# Patient Record
Sex: Male | Born: 1950 | Race: White | Hispanic: No | Marital: Married | Smoking: Former smoker
Health system: Southern US, Community
[De-identification: ages and names within clinical notes are randomized; demographics above are authoritative.]

## PROBLEM LIST (undated history)

## (undated) DIAGNOSIS — Z86711 Personal history of pulmonary embolism: Secondary | ICD-10-CM

## (undated) DIAGNOSIS — Z9289 Personal history of other medical treatment: Secondary | ICD-10-CM

## (undated) DIAGNOSIS — G8929 Other chronic pain: Secondary | ICD-10-CM

## (undated) DIAGNOSIS — I2699 Other pulmonary embolism without acute cor pulmonale: Secondary | ICD-10-CM

## (undated) DIAGNOSIS — Z973 Presence of spectacles and contact lenses: Secondary | ICD-10-CM

## (undated) DIAGNOSIS — C679 Malignant neoplasm of bladder, unspecified: Secondary | ICD-10-CM

## (undated) DIAGNOSIS — M109 Gout, unspecified: Secondary | ICD-10-CM

## (undated) DIAGNOSIS — Z87448 Personal history of other diseases of urinary system: Secondary | ICD-10-CM

## (undated) DIAGNOSIS — Z87828 Personal history of other (healed) physical injury and trauma: Secondary | ICD-10-CM

## (undated) DIAGNOSIS — E119 Type 2 diabetes mellitus without complications: Secondary | ICD-10-CM

## (undated) DIAGNOSIS — M545 Low back pain: Secondary | ICD-10-CM

## (undated) DIAGNOSIS — R399 Unspecified symptoms and signs involving the genitourinary system: Secondary | ICD-10-CM

## (undated) DIAGNOSIS — E782 Mixed hyperlipidemia: Secondary | ICD-10-CM

## (undated) DIAGNOSIS — C3490 Malignant neoplasm of unspecified part of unspecified bronchus or lung: Secondary | ICD-10-CM

## (undated) DIAGNOSIS — N210 Calculus in bladder: Secondary | ICD-10-CM

## (undated) DIAGNOSIS — M542 Cervicalgia: Secondary | ICD-10-CM

## (undated) DIAGNOSIS — M5135 Other intervertebral disc degeneration, thoracolumbar region: Secondary | ICD-10-CM

## (undated) DIAGNOSIS — L309 Dermatitis, unspecified: Secondary | ICD-10-CM

## (undated) DIAGNOSIS — S82009A Unspecified fracture of unspecified patella, initial encounter for closed fracture: Secondary | ICD-10-CM

## (undated) DIAGNOSIS — M4850XA Collapsed vertebra, not elsewhere classified, site unspecified, initial encounter for fracture: Secondary | ICD-10-CM

## (undated) DIAGNOSIS — I714 Abdominal aortic aneurysm, without rupture, unspecified: Secondary | ICD-10-CM

## (undated) DIAGNOSIS — M1A9XX0 Chronic gout, unspecified, without tophus (tophi): Secondary | ICD-10-CM

## (undated) DIAGNOSIS — K529 Noninfective gastroenteritis and colitis, unspecified: Secondary | ICD-10-CM

## (undated) DIAGNOSIS — N323 Diverticulum of bladder: Secondary | ICD-10-CM

## (undated) DIAGNOSIS — G4733 Obstructive sleep apnea (adult) (pediatric): Secondary | ICD-10-CM

## (undated) DIAGNOSIS — K08109 Complete loss of teeth, unspecified cause, unspecified class: Secondary | ICD-10-CM

## (undated) DIAGNOSIS — N400 Enlarged prostate without lower urinary tract symptoms: Secondary | ICD-10-CM

## (undated) DIAGNOSIS — T4145XA Adverse effect of unspecified anesthetic, initial encounter: Secondary | ICD-10-CM

## (undated) DIAGNOSIS — L509 Urticaria, unspecified: Secondary | ICD-10-CM

## (undated) DIAGNOSIS — Z85118 Personal history of other malignant neoplasm of bronchus and lung: Secondary | ICD-10-CM

## (undated) DIAGNOSIS — E7800 Pure hypercholesterolemia, unspecified: Secondary | ICD-10-CM

## (undated) DIAGNOSIS — T8859XA Other complications of anesthesia, initial encounter: Secondary | ICD-10-CM

## (undated) HISTORY — DX: Other pulmonary embolism without acute cor pulmonale: I26.99

## (undated) HISTORY — PX: BACK SURGERY: SHX140

## (undated) HISTORY — DX: Low back pain: M54.5

## (undated) HISTORY — PX: CHOLECYSTECTOMY: SHX55

## (undated) HISTORY — DX: Dermatitis, unspecified: L30.9

## (undated) HISTORY — PX: LUMBAR SPINE SURGERY: SHX701

## (undated) HISTORY — DX: Unspecified fracture of unspecified patella, initial encounter for closed fracture: S82.009A

## (undated) HISTORY — PX: TONSILLECTOMY: SUR1361

## (undated) HISTORY — DX: Urticaria, unspecified: L50.9

## (undated) HISTORY — PX: TOTAL KNEE ARTHROPLASTY: SHX125

## (undated) HISTORY — PX: ADENOIDECTOMY: SUR15

## (undated) HISTORY — PX: LAPAROSCOPIC CHOLECYSTECTOMY: SUR755

## (undated) HISTORY — PX: WRIST SURGERY: SHX841

## (undated) HISTORY — PX: NASAL SINUS SURGERY: SHX719

## (undated) HISTORY — PX: LUNG LOBECTOMY: SHX167

## (undated) HISTORY — PX: RECONSTRUCTION OF NOSE: SHX2301

## (undated) HISTORY — DX: Cervicalgia: M54.2

## (undated) HISTORY — PX: JOINT REPLACEMENT: SHX530

---
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
## 1998-09-14 ENCOUNTER — Emergency Department (HOSPITAL_COMMUNITY): Admission: EM | Admit: 1998-09-14 | Discharge: 1998-09-14 | Payer: Self-pay | Admitting: Emergency Medicine

## 1998-09-21 ENCOUNTER — Encounter: Admission: RE | Admit: 1998-09-21 | Discharge: 1998-09-21 | Payer: Self-pay | Admitting: *Deleted

## 2000-02-19 ENCOUNTER — Encounter: Payer: Self-pay | Admitting: Orthopedic Surgery

## 2000-02-19 ENCOUNTER — Ambulatory Visit (HOSPITAL_COMMUNITY): Admission: RE | Admit: 2000-02-19 | Discharge: 2000-02-19 | Payer: Self-pay | Admitting: Orthopedic Surgery

## 2000-05-16 ENCOUNTER — Ambulatory Visit (HOSPITAL_BASED_OUTPATIENT_CLINIC_OR_DEPARTMENT_OTHER): Admission: RE | Admit: 2000-05-16 | Discharge: 2000-05-16 | Payer: Self-pay | Admitting: Orthopedic Surgery

## 2000-12-11 ENCOUNTER — Ambulatory Visit (HOSPITAL_BASED_OUTPATIENT_CLINIC_OR_DEPARTMENT_OTHER): Admission: RE | Admit: 2000-12-11 | Discharge: 2000-12-12 | Payer: Self-pay | Admitting: Orthopedic Surgery

## 2000-12-11 ENCOUNTER — Encounter (INDEPENDENT_AMBULATORY_CARE_PROVIDER_SITE_OTHER): Payer: Self-pay | Admitting: *Deleted

## 2000-12-30 ENCOUNTER — Emergency Department (HOSPITAL_COMMUNITY): Admission: EM | Admit: 2000-12-30 | Discharge: 2000-12-30 | Payer: Self-pay | Admitting: Emergency Medicine

## 2001-07-08 ENCOUNTER — Encounter: Payer: Self-pay | Admitting: Internal Medicine

## 2001-07-08 ENCOUNTER — Encounter: Admission: RE | Admit: 2001-07-08 | Discharge: 2001-07-08 | Payer: Self-pay | Admitting: Internal Medicine

## 2001-11-07 ENCOUNTER — Encounter: Payer: Self-pay | Admitting: Orthopedic Surgery

## 2001-11-07 ENCOUNTER — Ambulatory Visit (HOSPITAL_COMMUNITY): Admission: RE | Admit: 2001-11-07 | Discharge: 2001-11-07 | Payer: Self-pay | Admitting: Orthopedic Surgery

## 2002-05-11 ENCOUNTER — Encounter: Admission: RE | Admit: 2002-05-11 | Discharge: 2002-05-11 | Payer: Self-pay | Admitting: Neurology

## 2002-05-11 ENCOUNTER — Encounter: Payer: Self-pay | Admitting: Neurology

## 2005-12-08 ENCOUNTER — Emergency Department (HOSPITAL_COMMUNITY): Admission: EM | Admit: 2005-12-08 | Discharge: 2005-12-08 | Payer: Self-pay | Admitting: Emergency Medicine

## 2009-04-08 ENCOUNTER — Emergency Department (HOSPITAL_COMMUNITY): Admission: EM | Admit: 2009-04-08 | Discharge: 2009-04-08 | Payer: Self-pay | Admitting: Emergency Medicine

## 2009-05-09 ENCOUNTER — Encounter: Admission: RE | Admit: 2009-05-09 | Discharge: 2009-05-26 | Payer: Self-pay | Admitting: Pathology

## 2009-05-29 ENCOUNTER — Encounter: Admission: RE | Admit: 2009-05-29 | Discharge: 2009-06-08 | Payer: Self-pay | Admitting: Pathology

## 2009-06-28 HISTORY — PX: LUNG CANCER SURGERY: SHX702

## 2010-08-30 LAB — BASIC METABOLIC PANEL
BUN: 17 mg/dL (ref 6–23)
CO2: 22 mEq/L (ref 19–32)
Chloride: 104 mEq/L (ref 96–112)
Creatinine, Ser: 0.91 mg/dL (ref 0.4–1.5)

## 2010-08-30 LAB — DIFFERENTIAL
Basophils Relative: 0 % (ref 0–1)
Eosinophils Absolute: 0.3 10*3/uL (ref 0.0–0.7)
Neutrophils Relative %: 70 % (ref 43–77)

## 2010-08-30 LAB — CBC
MCHC: 34.3 g/dL (ref 30.0–36.0)
MCV: 96.8 fL (ref 78.0–100.0)
Platelets: 861 10*3/uL — ABNORMAL HIGH (ref 150–400)
RDW: 14.5 % (ref 11.5–15.5)

## 2010-10-13 NOTE — Op Note (Signed)
[place]  Patient:    Harold Morris, Harold Morris                        MRN: [id number] Proc. Date: 12/11/00 Adm. Date:  [id number] Attending:  Ronne Binning                           Operative Report  PREOPERATIVE DIAGNOSIS:  Scapholunate lunotriquetral tear right wrist.  POSTOPERATIVE DIAGNOSIS:  Scapholunate lunotriquetral tear right wrist.  OPERATION:  Proximal row carpectomy right wrist.  SURGEON:  Nicki Reaper, M.D.  ASSISTANT:  R.N.  ANESTHESIA:  General.  ANESTHESIOLOGIST:  Janetta Hora. Gelene Mink, M.D.  HISTORY:  The patient is 60 year old male with a history of scapholunate lunotriquetral tear.  He has undergone arthroscopic debridement; however, has not been able to rehabilitate.  He continues to complain of pain.  He is admitted now for proximal row carpectomy.  DESCRIPTION OF PROCEDURE:  The patient was brought to the operating room, where a general anesthetic was carried out without difficulty.  He was prepped and draped using Betadine scrubbing solution with the right arm free in supine position.  A longitudinal incision was made over the dorsal aspect of the wrist, carried down through subcutaneous tissue.  Bleeders were electrocauterized.  The interval between third and fourth dorsal compartment was opened, the retinaculum incised.  The dissection carried down to the capsule.  A longitudinal incision was made in the distal capsule leaving the proximal capitate.  The scapholunate were identified.  The T was then made leaving a large ______ tissue present over the proximal aspect of the capitate-scaphoid area.  The scapholunate lunotriquetral joints were identified and both were found to be torn.  With blunt and sharp dissection, the scaphoid was removed in one piece, taking care to protect the proximal capitate and distal radial articular surface.  The triquetrum was removed next.  Again, this was done after isolating the bone with  blunt and sharp dissection.  It was dissected free, again protecting the proximal capitate. The lunate was removed last with blunt and sharp dissection.  Each bone was removed by placement of a joy stick or using bone clamps to allow traction and removal.  Care was taken to protect the proximal capitate and distal lunate facet of the radius.  The capitate immediately relocated into the lunate facet of the distal radius and was quite stable.  There was no impingement on the radial styloid.  The wound was copiously irrigated with saline.  The capsule was then closed with figure-of-eight 4-0 Mersilene sutures.  The retinaculum closed with 4-0 Mersilene.  A PLS wound drain was placed at the depths of the wound.  The subcutaneous tissue was closed with interrupted 4-0 Vicryl and skin with a subcuticular Monocryl suture.  Steri-Strips were applied.  Sterile compressive dressing and dorsal palmar splint applied.  X-rays confirmed positioning of the capitate in the lunate facet of the distal radius.  The patient tolerated the procedure well and was taken to the recovery room for observation in satisfactory condition.  He is admitted for an overnight stay, for pain control.  He will be discharged on Percocet and Septra DS. DD:  12/11/00 TD:  12/11/00 Job: [id number]

## 2010-10-21 ENCOUNTER — Emergency Department (HOSPITAL_COMMUNITY)
Admission: EM | Admit: 2010-10-21 | Discharge: 2010-10-21 | Discharge status: Against Medical Advice | Payer: 59 | Attending: Emergency Medicine | Admitting: Emergency Medicine

## 2010-10-21 DIAGNOSIS — R5381 Other malaise: Secondary | ICD-10-CM | POA: Insufficient documentation

## 2010-10-21 DIAGNOSIS — Z79899 Other long term (current) drug therapy: Secondary | ICD-10-CM | POA: Insufficient documentation

## 2010-10-21 DIAGNOSIS — R3589 Other polyuria: Secondary | ICD-10-CM | POA: Insufficient documentation

## 2010-10-21 DIAGNOSIS — R358 Other polyuria: Secondary | ICD-10-CM | POA: Insufficient documentation

## 2010-10-21 DIAGNOSIS — E119 Type 2 diabetes mellitus without complications: Secondary | ICD-10-CM | POA: Insufficient documentation

## 2010-10-21 DIAGNOSIS — E7800 Pure hypercholesterolemia, unspecified: Secondary | ICD-10-CM | POA: Insufficient documentation

## 2010-10-21 DIAGNOSIS — I10 Essential (primary) hypertension: Secondary | ICD-10-CM | POA: Insufficient documentation

## 2010-10-21 LAB — GLUCOSE, CAPILLARY: Glucose-Capillary: 111 mg/dL — ABNORMAL HIGH (ref 70–99)

## 2014-12-31 ENCOUNTER — Emergency Department (HOSPITAL_COMMUNITY): Payer: Medicare Other

## 2014-12-31 ENCOUNTER — Inpatient Hospital Stay (HOSPITAL_COMMUNITY)
Admission: EM | Admit: 2014-12-31 | Discharge: 2015-01-03 | DRG: 176 | Disposition: A | Discharge status: Home / Self Care | Payer: Medicare Other | Attending: Internal Medicine | Admitting: Internal Medicine

## 2014-12-31 ENCOUNTER — Encounter (HOSPITAL_COMMUNITY): Payer: Self-pay | Admitting: *Deleted

## 2014-12-31 DIAGNOSIS — M549 Dorsalgia, unspecified: Secondary | ICD-10-CM | POA: Diagnosis present

## 2014-12-31 DIAGNOSIS — M109 Gout, unspecified: Secondary | ICD-10-CM | POA: Diagnosis present

## 2014-12-31 DIAGNOSIS — E1165 Type 2 diabetes mellitus with hyperglycemia: Secondary | ICD-10-CM | POA: Diagnosis present

## 2014-12-31 DIAGNOSIS — Z9221 Personal history of antineoplastic chemotherapy: Secondary | ICD-10-CM

## 2014-12-31 DIAGNOSIS — I2699 Other pulmonary embolism without acute cor pulmonale: Principal | ICD-10-CM | POA: Diagnosis present

## 2014-12-31 DIAGNOSIS — Z79899 Other long term (current) drug therapy: Secondary | ICD-10-CM

## 2014-12-31 DIAGNOSIS — Z882 Allergy status to sulfonamides status: Secondary | ICD-10-CM

## 2014-12-31 DIAGNOSIS — E780 Pure hypercholesterolemia: Secondary | ICD-10-CM | POA: Diagnosis present

## 2014-12-31 DIAGNOSIS — Z79891 Long term (current) use of opiate analgesic: Secondary | ICD-10-CM

## 2014-12-31 DIAGNOSIS — E119 Type 2 diabetes mellitus without complications: Secondary | ICD-10-CM

## 2014-12-31 DIAGNOSIS — G8929 Other chronic pain: Secondary | ICD-10-CM | POA: Diagnosis present

## 2014-12-31 DIAGNOSIS — R609 Edema, unspecified: Secondary | ICD-10-CM

## 2014-12-31 DIAGNOSIS — Z885 Allergy status to narcotic agent status: Secondary | ICD-10-CM

## 2014-12-31 DIAGNOSIS — Z85118 Personal history of other malignant neoplasm of bronchus and lung: Secondary | ICD-10-CM

## 2014-12-31 DIAGNOSIS — M542 Cervicalgia: Secondary | ICD-10-CM | POA: Diagnosis present

## 2014-12-31 DIAGNOSIS — Z7982 Long term (current) use of aspirin: Secondary | ICD-10-CM

## 2014-12-31 DIAGNOSIS — Z888 Allergy status to other drugs, medicaments and biological substances status: Secondary | ICD-10-CM

## 2014-12-31 HISTORY — DX: Malignant neoplasm of unspecified part of unspecified bronchus or lung: C34.90

## 2014-12-31 HISTORY — DX: Pure hypercholesterolemia, unspecified: E78.00

## 2014-12-31 HISTORY — DX: Type 2 diabetes mellitus without complications: E11.9

## 2014-12-31 HISTORY — DX: Gout, unspecified: M10.9

## 2014-12-31 LAB — URINALYSIS, ROUTINE W REFLEX MICROSCOPIC
Bilirubin Urine: NEGATIVE
Glucose, UA: NEGATIVE mg/dL
Hgb urine dipstick: NEGATIVE
Ketones, ur: NEGATIVE mg/dL
Leukocytes, UA: NEGATIVE
Nitrite: NEGATIVE
Protein, ur: NEGATIVE mg/dL
Specific Gravity, Urine: 1.015 (ref 1.005–1.030)
Urobilinogen, UA: 0.2 mg/dL (ref 0.0–1.0)
pH: 5 (ref 5.0–8.0)

## 2014-12-31 LAB — CBC
HEMATOCRIT: 37.7 % — AB (ref 39.0–52.0)
HEMOGLOBIN: 12 g/dL — AB (ref 13.0–17.0)
MCH: 24.5 pg — AB (ref 26.0–34.0)
MCHC: 31.8 g/dL (ref 30.0–36.0)
MCV: 77.1 fL — AB (ref 78.0–100.0)
PLATELETS: 257 10*3/uL (ref 150–400)
RBC: 4.89 MIL/uL (ref 4.22–5.81)
RDW: 17.8 % — ABNORMAL HIGH (ref 11.5–15.5)
WBC: 10.8 10*3/uL — ABNORMAL HIGH (ref 4.0–10.5)

## 2014-12-31 LAB — CK: CK TOTAL: 73 U/L (ref 49–397)

## 2014-12-31 LAB — BASIC METABOLIC PANEL WITH GFR
Anion gap: 11 (ref 5–15)
BUN: 18 mg/dL (ref 6–20)
CO2: 21 mmol/L — ABNORMAL LOW (ref 22–32)
Calcium: 9.3 mg/dL (ref 8.9–10.3)
Chloride: 103 mmol/L (ref 101–111)
Creatinine, Ser: 1.42 mg/dL — ABNORMAL HIGH (ref 0.61–1.24)
GFR calc Af Amer: 58 mL/min — ABNORMAL LOW
GFR calc non Af Amer: 50 mL/min — ABNORMAL LOW
Glucose, Bld: 224 mg/dL — ABNORMAL HIGH (ref 65–99)
Potassium: 3.8 mmol/L (ref 3.5–5.1)
Sodium: 135 mmol/L (ref 135–145)

## 2014-12-31 LAB — I-STAT TROPONIN, ED: Troponin i, poc: 0 ng/mL (ref 0.00–0.08)

## 2014-12-31 LAB — TROPONIN I: Troponin I: 0.05 ng/mL — ABNORMAL HIGH (ref <0.031)

## 2014-12-31 MED ORDER — HEPARIN (PORCINE) IN NACL 100-0.45 UNIT/ML-% IJ SOLN
1750.0000 [IU]/h | INTRAMUSCULAR | Status: AC
  Administered 2015-01-01: 1600 [IU]/h via INTRAVENOUS
  Administered 2015-01-01: 1400 [IU]/h via INTRAVENOUS
  Administered 2015-01-02: 1600 [IU]/h via INTRAVENOUS
  Administered 2015-01-02: 1750 [IU]/h via INTRAVENOUS
  Filled 2014-12-31 (×8): qty 250

## 2014-12-31 MED ORDER — IOHEXOL 350 MG/ML SOLN
100.0000 mL | Freq: Once | INTRAVENOUS | Status: AC | PRN
  Administered 2014-12-31: 100 mL via INTRAVENOUS

## 2014-12-31 MED ORDER — SODIUM CHLORIDE 0.9 % IV BOLUS (SEPSIS)
1000.0000 mL | Freq: Once | INTRAVENOUS | Status: AC
  Administered 2014-12-31: 1000 mL via INTRAVENOUS

## 2014-12-31 MED ORDER — HEPARIN BOLUS VIA INFUSION
6000.0000 [IU] | Freq: Once | INTRAVENOUS | Status: AC
  Administered 2015-01-01: 6000 [IU] via INTRAVENOUS
  Filled 2014-12-31: qty 6000

## 2014-12-31 NOTE — ED Provider Notes (Addendum)
CSN: [id number]     Arrival date & time 12/31/14  1538 History   First MD Initiated Contact with Patient 12/31/14 2031     Chief Complaint  Patient presents with  . Chest Pain  . Headache  . Diarrhea     (Consider location/radiation/quality/duration/timing/severity/associated sxs/prior Treatment) HPI Comments: 64yo M w/ PMH including lung CA s/p resection, T2DM, HLD, gout, AAA who p/w generalized malaise, diarrhea, and chest pain. The patient states that 4-5 days ago, he began not feeling well. He has chronic diarrhea but it has been much worse over the past 4 days. He reports intermittent sharp pains that are in his bilateral hips, the right side of his trunk, and up into his right chest around to his back. The pain is worse with deep inspiration around his right chest. He has had some chronic shortness of breath with exertion since his surgery but feels that it may be worse recently. He reports a gradual onset of headache several days ago that was not sudden or maximal in onset. The headache is currently mild. He denies any fevers, cough/cold symptoms, sore throat, nausea, vomiting, or abdominal pain. He has noticed some polyuria and polydipsia. Eighth that he is laid in bed for the past 3 days. The patient denies any outdoor activity because of his chronic back pain and denies any tick exposure. No rashes.  Patient is a 64 y.o. male presenting with chest pain, headaches, and diarrhea. The history is provided by the patient.  Chest Pain Associated symptoms: headache   Headache Associated symptoms: diarrhea   Diarrhea Associated symptoms: headaches     Past Medical History  Diagnosis Date  . Lung cancer   . Diabetes mellitus without complication   . High cholesterol   . Gout    History reviewed. No pertinent past surgical history. History reviewed. No pertinent family history. History  Substance Use Topics  . Smoking status: Not on file  . Smokeless tobacco: Not on file  . Alcohol  Use: No    Review of Systems  Cardiovascular: Positive for chest pain.  Gastrointestinal: Positive for diarrhea.  Neurological: Positive for headaches.    10 Systems reviewed and are negative for acute change except as noted in the HPI.   Allergies  Epinephrine; Sulfa antibiotics; Hydrocodone; and Morphine  Home Medications   Prior to Admission medications   Medication Sig Start Date End Date Taking? Authorizing Provider  allopurinol (ZYLOPRIM) 300 MG tablet Take 300 mg by mouth daily.   Yes Historical Provider, MD  aspirin EC 81 MG tablet Take 81 mg by mouth daily.   Yes Historical Provider, MD  Cyanocobalamin (VITAMIN B-12 CR) 1000 MCG TBCR Take 1,000 mg by mouth daily.   Yes Historical Provider, MD  ERGOCALCIFEROL PO Take 1 tablet by mouth daily.   Yes Historical Provider, MD  fenofibrate (TRICOR) 145 MG tablet Take 145 mg by mouth daily.   Yes Historical Provider, MD  gabapentin (NEURONTIN) 600 MG tablet Take 600-900 mg by mouth 2 (two) times daily. Takes 2 caps in am and 3 caps at bedtime   Yes   HYDROcodone-acetaminophen (NORCO) 10-325 MG per tablet Take 1 tablet by mouth every 6 (six) hours as needed for moderate pain.   Yes Historical Provider, MD  magnesium oxide (MAG-OX) 400 MG tablet Take 400 mg by mouth 2 (two) times daily.   Yes Historical Provider, MD  meclizine (ANTIVERT) 25 MG tablet Take 25 mg by mouth daily as needed for dizziness.  Yes Historical Provider, MD  metFORMIN (GLUCOPHAGE) 500 MG tablet Take 500 mg by mouth daily before breakfast.   Yes Historical Provider, MD  omeprazole (PRILOSEC) 20 MG capsule Take 20 mg by mouth daily.   Yes Historical Provider, MD  sertraline (ZOLOFT) 100 MG tablet Take 100 mg by mouth daily.   Yes Historical Provider, MD   BP 104/51 mmHg  Pulse 75  Temp(Src) 98.2 F (36.8 C) (Oral)  Resp 20  SpO2 98% Physical Exam  Constitutional: He is oriented to person, place, and time. He appears well-developed and well-nourished. No  distress.  HENT:  Head: Normocephalic and atraumatic.  Moist mucous membranes  Eyes: Conjunctivae are normal. Pupils are equal, round, and reactive to light.  Neck: Neck supple.  Cardiovascular: Normal rate, regular rhythm and normal heart sounds.   No murmur heard. Pulmonary/Chest: Effort normal and breath sounds normal.  Abdominal: Soft. Bowel sounds are normal. He exhibits no distension. There is no tenderness.  Musculoskeletal: He exhibits no edema.  Neurological: He is alert and oriented to person, place, and time.  Fluent speech  Skin: Skin is warm and dry.  Psychiatric: Judgment normal.  Bizarre affect  Nursing note and vitals reviewed.   ED Course  Procedures (including critical care time) Labs Review Labs Reviewed  BASIC METABOLIC PANEL - Abnormal; Notable for the following:    CO2 21 (*)    Glucose, Bld 224 (*)    Creatinine, Ser 1.42 (*)    GFR calc non Af Amer 50 (*)    GFR calc Af Amer 58 (*)    All other components within normal limits  CBC - Abnormal; Notable for the following:    WBC 10.8 (*)    Hemoglobin 12.0 (*)    HCT 37.7 (*)    MCV 77.1 (*)    MCH 24.5 (*)    RDW 17.8 (*)    All other components within normal limits  TROPONIN I - Abnormal; Notable for the following:    Troponin I 0.05 (*)    All other components within normal limits  CK  URINALYSIS, ROUTINE W REFLEX MICROSCOPIC (NOT AT [facility])  BRAIN NATRIURETIC PEPTIDE  I-STAT TROPOININ, ED    Imaging Review Dg Chest 2 View  12/31/2014   CLINICAL DATA:  Shortness of breath for several months on off after activity including walking, worse recently, past history lung cancer, diabetes mellitus  EXAM: CHEST  2 VIEW  COMPARISON:  04/08/2009  FINDINGS: Upper normal heart size.  Mediastinal contours and pulmonary vascularity normal.  LEFT basilar atelectasis.  Remaining lungs clear.  No pleural effusion or pneumothorax.  IMPRESSION: LEFT basilar atelectasis.  Previous identified LEFT perihilar mass/tumor  no longer identified.   Electronically Signed   By: Lavonia Dana M.D.   On: 12/31/2014 16:30   Ct Angio Chest Pe W/cm &/or Wo Cm  12/31/2014   CLINICAL DATA:  Right chest pain and mid chest pressure radiating into the back and down the right torso. Known abdominal aortic aneurysm.  EXAM: CT ANGIOGRAPHY CHEST  CT ABDOMEN AND PELVIS WITH CONTRAST  TECHNIQUE: Multidetector CT imaging of the chest was performed using the standard protocol during bolus administration of intravenous contrast. Multiplanar CT image reconstructions and MIPs were obtained to evaluate the vascular anatomy. Multidetector CT imaging of the abdomen and pelvis was performed using the standard protocol during bolus administration of intravenous contrast.  CONTRAST:  168m OMNIPAQUE IOHEXOL 350 MG/ML SOLN  COMPARISON:  04/08/2009  FINDINGS: CTA CHEST FINDINGS  Cardiovascular: There is a large volume pulmonary embolus within the right pulmonary arterial system, involving upper lobe, middle lobe and lower lobe. No left-sided pulmonary emboli are evident. There is CT evidence of right heart strain. The RV:LV ratio is 1.1.  Lungs: There is prior left lower lobectomy. There is mild curvilinear scarring in the left base. No confluent airspace consolidation.  Central airways: Patent  Effusions: None  Lymphadenopathy: None  Esophagus: Unremarkable  Musculoskeletal: No significant abnormality.  CT ABDOMEN and PELVIS FINDINGS  Hepatobiliary: There is prior cholecystectomy. The liver and bile ducts appear normal.  Pancreas: Normal  Spleen: Normal  Adrenals/Urinary Tract: The adrenals and kidneys are normal in appearance. There is no urinary calculus evident. There is no hydronephrosis or ureteral dilatation. Collecting systems and ureters appear unremarkable. There is a diverticulum at the right posterolateral aspect of the urinary bladder just anterior to the ureterovesical junction.  Stomach/Bowel: There are normal appearances of the stomach, small bowel  and colon. The appendix is normal.  Vascular/Lymphatic: There is a 3.2 cm infrarenal abdominal aortic aneurysm without dissection or leak. There is mild atherosclerotic calcification.  Reproductive: Unremarkable  Other: No acute inflammatory changes are evident in the abdomen or pelvis. There is no adenopathy. There is no ascites.  Musculoskeletal: There is posterior decompression of the lumbar spine from L3 through the sacrum with instrumented fusion from L4 through the sacrum. There is a fat containing left inguinal hernia  Review of the MIP images confirms the above findings.  IMPRESSION: 1. Large volume pulmonary embolism to the right pulmonary arterial system, with CT evidence of right heart strain. These results were called by telephone at the time of interpretation on 12/31/2014 at 11:20 pm to Dr. Theotis Burrow , who verbally acknowledged these results. Positive for acute PE with CT evidence of right heart strain (RV/LV Ratio = 1.1) consistent with at least submassive (intermediate risk) PE. The presence of right heart strain has been associated with an increased risk of morbidity and mortality. Please activate Code PE by paging [phone]. 2. Left lower lobectomy. No evidence of active neoplastic disease in the chest. 3. 3.2 cm infrarenal abdominal aortic aneurysm 4. Right-sided urinary bladder Hutch diverticulum 5. Fat containing left inguinal hernia.   Electronically Signed   By: Andreas Newport M.D.   On: 12/31/2014 23:25   Ct Abdomen Pelvis W Contrast  12/31/2014   CLINICAL DATA:  Right chest pain and mid chest pressure radiating into the back and down the right torso. Known abdominal aortic aneurysm.  EXAM: CT ANGIOGRAPHY CHEST  CT ABDOMEN AND PELVIS WITH CONTRAST  TECHNIQUE: Multidetector CT imaging of the chest was performed using the standard protocol during bolus administration of intravenous contrast. Multiplanar CT image reconstructions and MIPs were obtained to evaluate the vascular anatomy.  Multidetector CT imaging of the abdomen and pelvis was performed using the standard protocol during bolus administration of intravenous contrast.  CONTRAST:  164m OMNIPAQUE IOHEXOL 350 MG/ML SOLN  COMPARISON:  04/08/2009  FINDINGS: CTA CHEST FINDINGS  Cardiovascular: There is a large volume pulmonary embolus within the right pulmonary arterial system, involving upper lobe, middle lobe and lower lobe. No left-sided pulmonary emboli are evident. There is CT evidence of right heart strain. The RV:LV ratio is 1.1.  Lungs: There is prior left lower lobectomy. There is mild curvilinear scarring in the left base. No confluent airspace consolidation.  Central airways: Patent  Effusions: None  Lymphadenopathy: None  Esophagus: Unremarkable  Musculoskeletal: No significant abnormality.  CT ABDOMEN  and PELVIS FINDINGS  Hepatobiliary: There is prior cholecystectomy. The liver and bile ducts appear normal.  Pancreas: Normal  Spleen: Normal  Adrenals/Urinary Tract: The adrenals and kidneys are normal in appearance. There is no urinary calculus evident. There is no hydronephrosis or ureteral dilatation. Collecting systems and ureters appear unremarkable. There is a diverticulum at the right posterolateral aspect of the urinary bladder just anterior to the ureterovesical junction.  Stomach/Bowel: There are normal appearances of the stomach, small bowel and colon. The appendix is normal.  Vascular/Lymphatic: There is a 3.2 cm infrarenal abdominal aortic aneurysm without dissection or leak. There is mild atherosclerotic calcification.  Reproductive: Unremarkable  Other: No acute inflammatory changes are evident in the abdomen or pelvis. There is no adenopathy. There is no ascites.  Musculoskeletal: There is posterior decompression of the lumbar spine from L3 through the sacrum with instrumented fusion from L4 through the sacrum. There is a fat containing left inguinal hernia  Review of the MIP images confirms the above findings.   IMPRESSION: 1. Large volume pulmonary embolism to the right pulmonary arterial system, with CT evidence of right heart strain. These results were called by telephone at the time of interpretation on 12/31/2014 at 11:20 pm to Dr. Theotis Burrow , who verbally acknowledged these results. Positive for acute PE with CT evidence of right heart strain (RV/LV Ratio = 1.1) consistent with at least submassive (intermediate risk) PE. The presence of right heart strain has been associated with an increased risk of morbidity and mortality. Please activate Code PE by paging [phone]. 2. Left lower lobectomy. No evidence of active neoplastic disease in the chest. 3. 3.2 cm infrarenal abdominal aortic aneurysm 4. Right-sided urinary bladder Hutch diverticulum 5. Fat containing left inguinal hernia.   Electronically Signed   By: Andreas Newport M.D.   On: 12/31/2014 23:25     EKG Interpretation   Date/Time:  Friday December 31 2014 15:45:41 EDT Ventricular Rate:  92 PR Interval:  140 QRS Duration: 82 QT Interval:  364 QTC Calculation: 450 R Axis:   -30 Text Interpretation:  Normal sinus rhythm Left axis deviation Abnormal ECG  Confirmed by Makayle Krahn MD, Laurali Goddard ([id number]) on 12/31/2014 8:35:43 PM      MDM   Final diagnoses:  None  submassive PE with R heart strain H/o lung CA  64yo M w/ past mental history including lung cancer who presents with several days of right-sided chest pain and malaise. Patient with normal vital signs at presentation and in no acute distress. Obtained above lab work as well as EKG which was nonischemic. Chest x-ray without any acute findings. Labs notable for hyperglycemia without any other acute findings. Because of the patient's history of lung cancer, I am concerned about PE. He also reports that he has a AAA that has been followed by the [place]. Because of his multiple complaints, obtained CTA of chest to r/o PE and CT abd/pel to evaluate AAA.   Repeat troponin was 0.05. CT scan showed  a stable 3 cm AAA but multiple acute right-sided PE is with evidence of right heart strain, RV/LV ratio 1.1. Patient denies any blood in his stools. Immediately began heparin per PE protocol. Consulted PE Code team to evaluate patient. Patient will be admitted to general medicine for further treatment.   CRITICAL CARE Performed by: Wenda Overland Sammie Schermerhorn   Total critical care time: 30 minutes  Critical care time was exclusive of separately billable procedures and treating other patients.  Critical care was necessary to  treat or prevent imminent or life-threatening deterioration.  Critical care was time spent personally by me on the following activities: development of treatment plan with patient and/or surrogate as well as nursing, discussions with consultants, evaluation of patient's response to treatment, examination of patient, obtaining history from patient or surrogate, ordering and performing treatments and interventions, ordering and review of laboratory studies, ordering and review of radiographic studies, pulse oximetry and re-evaluation of patient's condition.  Sharlett Iles, MD 12/31/14 [place], MD 01/01/15 [phone]

## 2014-12-31 NOTE — ED Notes (Signed)
Attempted IV x2, unsuccessful.  

## 2014-12-31 NOTE — ED Notes (Signed)
Pt reports not feeling well x 4-5 days. Had diarrhea initially and now reports right and mid side chest pains, headaches and dizziness. Denies n/v.

## 2014-12-31 NOTE — ED Notes (Signed)
Pt refused high fall risk socks.

## 2014-12-31 NOTE — Progress Notes (Signed)
ANTICOAGULATION CONSULT NOTE - Initial Consult  Pharmacy Consult for Heparin  Indication: pulmonary embolus  Allergies  Allergen Reactions  . Epinephrine Anaphylaxis  . Sulfa Antibiotics Other (See Comments)    unknown  . Hydrocodone Itching and Rash    High strength dosing causes reaction  . Morphine Other (See Comments)    unknown    Patient Measurements: Pt reported: ~106 kg  Vital Signs: Temp: 98.2 F (36.8 C) (08/05 1542) Temp Source: Oral (08/05 1542) BP: 104/51 mmHg (08/05 2300) Pulse Rate: 75 (08/05 2300)  Labs:  Recent Labs  12/31/14 1602 12/31/14 2205  HGB 12.0*  --   HCT 37.7*  --   PLT 257  --   CREATININE 1.42*  --   CKTOTAL  --  73  TROPONINI  --  0.05*    CrCl cannot be calculated (Unknown ideal weight.).   Medical History: Past Medical History  Diagnosis Date  . Lung cancer   . Diabetes mellitus without complication   . High cholesterol   . Gout      Assessment: 64 y/o M with h/o lung CA s/p resection, found to have large right-sided PE on CT Angio, Hgb 12, mildly elevated Scr, other labs reviewed.   Goal of Therapy:  Heparin level 0.3-0.7 units/ml Monitor platelets by anticoagulation protocol: Yes   Plan:  -Heparin 6000 units BOLUS -Start heparin drip at 1400 units/hr -0800 HL -Daily CBC/HL -Monitor for bleeding  Narda Bonds 12/31/2014,11:44 PM

## 2015-01-01 ENCOUNTER — Inpatient Hospital Stay (HOSPITAL_COMMUNITY): Payer: Medicare Other

## 2015-01-01 ENCOUNTER — Encounter (HOSPITAL_COMMUNITY): Payer: Self-pay | Admitting: Internal Medicine

## 2015-01-01 DIAGNOSIS — R609 Edema, unspecified: Secondary | ICD-10-CM

## 2015-01-01 DIAGNOSIS — Z85118 Personal history of other malignant neoplasm of bronchus and lung: Secondary | ICD-10-CM | POA: Diagnosis not present

## 2015-01-01 DIAGNOSIS — Z9221 Personal history of antineoplastic chemotherapy: Secondary | ICD-10-CM | POA: Diagnosis not present

## 2015-01-01 DIAGNOSIS — M549 Dorsalgia, unspecified: Secondary | ICD-10-CM | POA: Diagnosis present

## 2015-01-01 DIAGNOSIS — E1165 Type 2 diabetes mellitus with hyperglycemia: Secondary | ICD-10-CM | POA: Diagnosis present

## 2015-01-01 DIAGNOSIS — Z79899 Other long term (current) drug therapy: Secondary | ICD-10-CM | POA: Diagnosis not present

## 2015-01-01 DIAGNOSIS — M542 Cervicalgia: Secondary | ICD-10-CM | POA: Diagnosis present

## 2015-01-01 DIAGNOSIS — E119 Type 2 diabetes mellitus without complications: Secondary | ICD-10-CM | POA: Diagnosis not present

## 2015-01-01 DIAGNOSIS — Z882 Allergy status to sulfonamides status: Secondary | ICD-10-CM | POA: Diagnosis not present

## 2015-01-01 DIAGNOSIS — M109 Gout, unspecified: Secondary | ICD-10-CM | POA: Diagnosis present

## 2015-01-01 DIAGNOSIS — Z888 Allergy status to other drugs, medicaments and biological substances status: Secondary | ICD-10-CM | POA: Diagnosis not present

## 2015-01-01 DIAGNOSIS — Z79891 Long term (current) use of opiate analgesic: Secondary | ICD-10-CM | POA: Diagnosis not present

## 2015-01-01 DIAGNOSIS — Z7982 Long term (current) use of aspirin: Secondary | ICD-10-CM | POA: Diagnosis not present

## 2015-01-01 DIAGNOSIS — E780 Pure hypercholesterolemia: Secondary | ICD-10-CM | POA: Diagnosis present

## 2015-01-01 DIAGNOSIS — I2699 Other pulmonary embolism without acute cor pulmonale: Secondary | ICD-10-CM | POA: Diagnosis present

## 2015-01-01 DIAGNOSIS — Z885 Allergy status to narcotic agent status: Secondary | ICD-10-CM | POA: Diagnosis not present

## 2015-01-01 DIAGNOSIS — G8929 Other chronic pain: Secondary | ICD-10-CM | POA: Diagnosis present

## 2015-01-01 LAB — CBC
HCT: 34.6 % — ABNORMAL LOW (ref 39.0–52.0)
HEMOGLOBIN: 11.1 g/dL — AB (ref 13.0–17.0)
MCH: 24.3 pg — AB (ref 26.0–34.0)
MCHC: 32.1 g/dL (ref 30.0–36.0)
MCV: 75.7 fL — ABNORMAL LOW (ref 78.0–100.0)
Platelets: 214 10*3/uL (ref 150–400)
RBC: 4.57 MIL/uL (ref 4.22–5.81)
RDW: 17.6 % — AB (ref 11.5–15.5)
WBC: 9.2 10*3/uL (ref 4.0–10.5)

## 2015-01-01 LAB — COMPREHENSIVE METABOLIC PANEL
ALK PHOS: 78 U/L (ref 38–126)
ALT: 14 U/L — AB (ref 17–63)
ANION GAP: 11 (ref 5–15)
AST: 23 U/L (ref 15–41)
Albumin: 3.4 g/dL — ABNORMAL LOW (ref 3.5–5.0)
BUN: 16 mg/dL (ref 6–20)
CHLORIDE: 106 mmol/L (ref 101–111)
CO2: 20 mmol/L — AB (ref 22–32)
Calcium: 8.6 mg/dL — ABNORMAL LOW (ref 8.9–10.3)
Creatinine, Ser: 1.38 mg/dL — ABNORMAL HIGH (ref 0.61–1.24)
GFR calc Af Amer: 60 mL/min — ABNORMAL LOW (ref >60)
GFR calc non Af Amer: 52 mL/min — ABNORMAL LOW (ref >60)
GLUCOSE: 179 mg/dL — AB (ref 65–99)
Potassium: 4 mmol/L (ref 3.5–5.1)
Sodium: 137 mmol/L (ref 135–145)
TOTAL PROTEIN: 6.1 g/dL — AB (ref 6.5–8.1)
Total Bilirubin: 0.5 mg/dL (ref 0.3–1.2)

## 2015-01-01 LAB — TROPONIN I
Troponin I: 0.05 ng/mL — ABNORMAL HIGH (ref <0.031)
Troponin I: 0.04 ng/mL — ABNORMAL HIGH (ref <0.031)
Troponin I: 0.04 ng/mL — ABNORMAL HIGH (ref <0.031)

## 2015-01-01 LAB — GLUCOSE, CAPILLARY
GLUCOSE-CAPILLARY: 151 mg/dL — AB (ref 65–99)
GLUCOSE-CAPILLARY: 133 mg/dL — AB (ref 65–99)
Glucose-Capillary: 176 mg/dL — ABNORMAL HIGH (ref 65–99)
Glucose-Capillary: 139 mg/dL — ABNORMAL HIGH (ref 65–99)

## 2015-01-01 LAB — HEPARIN LEVEL (UNFRACTIONATED)
Heparin Unfractionated: 0.21 IU/mL — ABNORMAL LOW (ref 0.30–0.70)
Heparin Unfractionated: 0.35 IU/mL (ref 0.30–0.70)

## 2015-01-01 LAB — MRSA PCR SCREENING: MRSA by PCR: NEGATIVE

## 2015-01-01 LAB — BRAIN NATRIURETIC PEPTIDE: B NATRIURETIC PEPTIDE 5: 60.5 pg/mL (ref 0.0–100.0)

## 2015-01-01 MED ORDER — ACETAMINOPHEN 650 MG RE SUPP: 650.0000 mg | Freq: Four times a day (QID) | RECTAL | Status: DC | PRN

## 2015-01-01 MED ORDER — INSULIN ASPART 100 UNIT/ML ~~LOC~~ SOLN
0.0000 [IU] | Freq: Three times a day (TID) | SUBCUTANEOUS | Status: DC
  Administered 2015-01-01: 3 [IU] via SUBCUTANEOUS
  Administered 2015-01-01: 2 [IU] via SUBCUTANEOUS
  Administered 2015-01-01: 3 [IU] via SUBCUTANEOUS
  Administered 2015-01-02: 2 [IU] via SUBCUTANEOUS
  Administered 2015-01-02: 5 [IU] via SUBCUTANEOUS
  Administered 2015-01-03 (×2): 2 [IU] via SUBCUTANEOUS

## 2015-01-01 MED ORDER — HYDROCODONE-ACETAMINOPHEN 5-325 MG PO TABS
2.0000 | ORAL_TABLET | Freq: Once | ORAL | Status: AC
  Administered 2015-01-01: 2 via ORAL
  Filled 2015-01-01: qty 2

## 2015-01-01 MED ORDER — ACETAMINOPHEN 325 MG PO TABS
650.0000 mg | ORAL_TABLET | Freq: Four times a day (QID) | ORAL | Status: DC | PRN
  Administered 2015-01-01 – 2015-01-02 (×2): 650 mg via ORAL
  Filled 2015-01-01 (×2): qty 2

## 2015-01-01 MED ORDER — SODIUM CHLORIDE 0.9 % IJ SOLN
3.0000 mL | Freq: Two times a day (BID) | INTRAMUSCULAR | Status: DC
  Administered 2015-01-01 – 2015-01-03 (×4): 3 mL via INTRAVENOUS

## 2015-01-01 MED ORDER — SERTRALINE HCL 100 MG PO TABS
100.0000 mg | ORAL_TABLET | Freq: Every day | ORAL | Status: DC
  Administered 2015-01-01 – 2015-01-03 (×3): 100 mg via ORAL
  Filled 2015-01-01 (×4): qty 1

## 2015-01-01 MED ORDER — ALLOPURINOL 300 MG PO TABS
300.0000 mg | ORAL_TABLET | Freq: Every day | ORAL | Status: DC
  Administered 2015-01-01 – 2015-01-03 (×3): 300 mg via ORAL
  Filled 2015-01-01 (×4): qty 1

## 2015-01-01 MED ORDER — PANTOPRAZOLE SODIUM 40 MG PO TBEC
40.0000 mg | DELAYED_RELEASE_TABLET | Freq: Every day | ORAL | Status: DC
  Administered 2015-01-01 – 2015-01-03 (×3): 40 mg via ORAL
  Filled 2015-01-01 (×3): qty 1

## 2015-01-01 MED ORDER — VITAMIN B-12 1000 MCG PO TABS
1000000.0000 ug | ORAL_TABLET | Freq: Every day | ORAL | Status: DC
  Administered 2015-01-01: 1000 ug via ORAL
  Filled 2015-01-01: qty 1000

## 2015-01-01 MED ORDER — SODIUM CHLORIDE 0.9 % IV SOLN
INTRAVENOUS | Status: AC
  Administered 2015-01-01: 75 mL via INTRAVENOUS

## 2015-01-01 MED ORDER — VITAMIN B-12 1000 MCG PO TABS
1000.0000 ug | ORAL_TABLET | Freq: Every day | ORAL | Status: DC
  Administered 2015-01-02 – 2015-01-03 (×2): 1000 ug via ORAL
  Filled 2015-01-01 (×2): qty 1

## 2015-01-01 MED ORDER — GABAPENTIN 600 MG PO TABS: 600.0000 mg | ORAL_TABLET | Freq: Two times a day (BID) | ORAL | Status: DC

## 2015-01-01 MED ORDER — HYDROCODONE-ACETAMINOPHEN 10-325 MG PO TABS
1.0000 | ORAL_TABLET | Freq: Four times a day (QID) | ORAL | Status: DC | PRN
  Administered 2015-01-01 – 2015-01-03 (×7): 1 via ORAL
  Filled 2015-01-01 (×7): qty 1

## 2015-01-01 MED ORDER — GABAPENTIN 300 MG PO CAPS
900.0000 mg | ORAL_CAPSULE | Freq: Every day | ORAL | Status: DC
  Administered 2015-01-01 – 2015-01-02 (×3): 900 mg via ORAL
  Filled 2015-01-01 (×4): qty 3

## 2015-01-01 MED ORDER — OXYCODONE HCL 5 MG PO TABS: 5.0000 mg | ORAL_TABLET | ORAL | Status: DC | PRN

## 2015-01-01 MED ORDER — HYDROCODONE-ACETAMINOPHEN 5-325 MG PO TABS
1.0000 | ORAL_TABLET | Freq: Four times a day (QID) | ORAL | Status: DC | PRN
  Administered 2015-01-01 (×2): 1 via ORAL
  Filled 2015-01-01 (×2): qty 1

## 2015-01-01 MED ORDER — GABAPENTIN 300 MG PO CAPS
600.0000 mg | ORAL_CAPSULE | Freq: Every day | ORAL | Status: DC
  Administered 2015-01-01 – 2015-01-03 (×3): 600 mg via ORAL
  Filled 2015-01-01 (×4): qty 2

## 2015-01-01 MED ORDER — ONDANSETRON HCL 4 MG/2ML IJ SOLN
4.0000 mg | Freq: Four times a day (QID) | INTRAMUSCULAR | Status: DC | PRN
Start: 2015-01-01 — End: 2015-01-03

## 2015-01-01 MED ORDER — HEPARIN BOLUS VIA INFUSION
1500.0000 [IU] | Freq: Once | INTRAVENOUS | Status: AC
  Administered 2015-01-01: 1500 [IU] via INTRAVENOUS
  Filled 2015-01-01: qty 1500

## 2015-01-01 MED ORDER — DOCUSATE SODIUM 100 MG PO CAPS
100.0000 mg | ORAL_CAPSULE | Freq: Two times a day (BID) | ORAL | Status: DC
Start: 2015-01-01 — End: 2015-01-03
  Administered 2015-01-01 – 2015-01-02 (×2): 100 mg via ORAL
  Filled 2015-01-01 (×7): qty 1

## 2015-01-01 MED ORDER — ONDANSETRON HCL 4 MG PO TABS: 4.0000 mg | ORAL_TABLET | Freq: Four times a day (QID) | ORAL | Status: DC | PRN

## 2015-01-01 NOTE — H&P (Signed)
[facility] History and Physical  Harold Morris AUQ:[id number] DOB: [birth year] DOA: 12/31/2014   PCP: He goes to the [place] at [facility]  Specialists: None  Chief Complaint: Chest pain, back pain  HPI: Harold Morris is a 65 y.o. male with a past medical history of diabetes, chronic back and neck pain, previous history of lung cancer presented with complaints of chest pain, back pain, hip pain ongoing for the last week or so. Denies any falls or injuries. The pain in the chest is mostly in the right side. Increases with deep breathing. It was 9 out of 10 in intensity. Sharp pain. He had shortness of breath and dizziness. Had a cough with clear expectoration. Denies any blood in the sputum. Had some nausea but no vomiting. Denies any blood in the stool or black colored stools. Never had blood clots before. His lung cancer was diagnosed 5 years ago and he underwent surgery at the [place]. This was followed by chemotherapy. Hasn't had any treatment in the last 3-4 years. Has been monitored at the [place]. After receiving pain medications his chest pain is improved. Evaluation in the emergency department revealed acute pulmonary embolism.  Home Medications: Prior to Admission medications   Medication Sig Start Date End Date Taking? Authorizing Provider  allopurinol (ZYLOPRIM) 300 MG tablet Take 300 mg by mouth daily.   Yes Historical Provider, MD  aspirin EC 81 MG tablet Take 81 mg by mouth daily.   Yes Historical Provider, MD  Cyanocobalamin (VITAMIN B-12 CR) 1000 MCG TBCR Take 1,000 mg by mouth daily.   Yes Historical Provider, MD  ERGOCALCIFEROL PO Take 1 tablet by mouth daily.   Yes Historical Provider, MD  fenofibrate (TRICOR) 145 MG tablet Take 145 mg by mouth daily.   Yes Historical Provider, MD  gabapentin (NEURONTIN) 600 MG tablet Take 600-900 mg by mouth 2 (two) times daily. Takes 2 caps in am and 3 caps at bedtime   Yes   HYDROcodone-acetaminophen (NORCO) 10-325 MG per tablet Take 1 tablet by mouth  every 6 (six) hours as needed for moderate pain.   Yes Historical Provider, MD  magnesium oxide (MAG-OX) 400 MG tablet Take 400 mg by mouth 2 (two) times daily.   Yes Historical Provider, MD  meclizine (ANTIVERT) 25 MG tablet Take 25 mg by mouth daily as needed for dizziness.    Yes Historical Provider, MD  metFORMIN (GLUCOPHAGE) 500 MG tablet Take 500 mg by mouth daily before breakfast.   Yes Historical Provider, MD  omeprazole (PRILOSEC) 20 MG capsule Take 20 mg by mouth daily.   Yes Historical Provider, MD  sertraline (ZOLOFT) 100 MG tablet Take 100 mg by mouth daily.   Yes Historical Provider, MD    Allergies:  Allergies  Allergen Reactions  . Epinephrine Anaphylaxis  . Sulfa Antibiotics Other (See Comments)    unknown  . Hydrocodone Itching and Rash    High strength dosing causes reaction  . Morphine Other (See Comments)    unknown    Past Medical History: Past Medical History  Diagnosis Date  . Lung cancer   . Diabetes mellitus without complication   . High cholesterol   . Gout     History reviewed. No pertinent past surgical history.  Social History: He lives in [place] with his wife. No smoking. Occasional alcohol use. He uses a walker to ambulate. Denies any illicit drug use.  Family History:  denies any health problems in his family  Review of Systems - History  obtained from the patient General ROS: negative Psychological ROS: negative Ophthalmic ROS: negative ENT ROS: negative Allergy and Immunology ROS: negative Hematological and Lymphatic ROS: negative Endocrine ROS: negative Respiratory ROS: as in hpi Cardiovascular ROS: as in hpi Gastrointestinal ROS: no abdominal pain, change in bowel habits, or black or bloody stools Genito-Urinary ROS: no dysuria, trouble voiding, or hematuria Musculoskeletal ROS: negative Neurological ROS: no TIA or stroke symptoms Dermatological ROS: negative  Physical Examination  Filed Vitals:   12/31/14 2300 12/31/14  2330 01/01/15 0000 01/01/15 0030  BP: 104/51 116/53 92/56   Pulse: 77 71 71 76  Temp:      TempSrc:      Resp: 22 21 19 21   SpO2: 98% 99% 100% 100%    BP 92/56 mmHg  Pulse 76  Temp(Src) 98.2 F (36.8 C) (Oral)  Resp 21  SpO2 100%  General appearance: alert, cooperative, appears stated age and no distress Head: Normocephalic, without obvious abnormality, atraumatic Eyes: conjunctivae/corneas clear. PERRL, EOM's intact. Fundi benign. Throat: lips, mucosa, and tongue normal; teeth and gums normal Resp: Decreased air entry at the bases. No crackles or wheezing. No rhonchi. Cardio: regular rate and rhythm, S1, S2 normal, no murmur, click, rub or gallop GI: soft, non-tender; bowel sounds normal; no masses,  no organomegaly Extremities: Minimal edema noted bilateral lower extremity Pulses: 2+ and symmetric Skin: Skin color, texture, turgor normal. No rashes or lesions Lymph nodes: Cervical, supraclavicular, and axillary nodes normal. Neurologic: Alert and oriented 3. No focal neurological deficits are noted.  Laboratory Data: Results for orders placed or performed during the hospital encounter of 12/31/14 (from the past 48 hour(s))  Basic metabolic panel     Status: Abnormal   Collection Time: 12/31/14  4:02 PM  Result Value Ref Range   Sodium 135 135 - 145 mmol/L   Potassium 3.8 3.5 - 5.1 mmol/L   Chloride 103 101 - 111 mmol/L   CO2 21 (L) 22 - 32 mmol/L   Glucose, Bld 224 (H) 65 - 99 mg/dL   BUN 18 6 - 20 mg/dL   Creatinine, Ser 1.42 (H) 0.61 - 1.24 mg/dL   Calcium 9.3 8.9 - 10.3 mg/dL   GFR calc non Af Amer 50 (L) >60 mL/min   GFR calc Af Amer 58 (L) >60 mL/min    Comment: (NOTE) The eGFR has been calculated using the CKD EPI equation. This calculation has not been validated in all clinical situations. eGFR's persistently <60 mL/min signify possible Chronic Kidney Disease.    Anion gap 11 5 - 15  CBC     Status: Abnormal   Collection Time: 12/31/14  4:02 PM  Result  Value Ref Range   WBC 10.8 (H) 4.0 - 10.5 K/uL   RBC 4.89 4.22 - 5.81 MIL/uL   Hemoglobin 12.0 (L) 13.0 - 17.0 g/dL   HCT 37.7 (L) 39.0 - 52.0 %   MCV 77.1 (L) 78.0 - 100.0 fL   MCH 24.5 (L) 26.0 - 34.0 pg   MCHC 31.8 30.0 - 36.0 g/dL   RDW 17.8 (H) 11.5 - 15.5 %   Platelets 257 150 - 400 K/uL  I-stat troponin, ED     Status: None   Collection Time: 12/31/14  4:07 PM  Result Value Ref Range   Troponin i, poc 0.00 0.00 - 0.08 ng/mL   Comment 3            Comment: Due to the release kinetics of cTnI, a negative result within the first  hours of the onset of symptoms does not rule out myocardial infarction with certainty. If myocardial infarction is still suspected, repeat the test at appropriate intervals.   Troponin I     Status: Abnormal   Collection Time: 12/31/14 10:05 PM  Result Value Ref Range   Troponin I 0.05 (H) <0.031 ng/mL    Comment:        PERSISTENTLY INCREASED TROPONIN VALUES IN THE RANGE OF 0.04-0.49 ng/mL CAN BE SEEN IN:       -UNSTABLE ANGINA       -CONGESTIVE HEART FAILURE       -MYOCARDITIS       -CHEST TRAUMA       -ARRYHTHMIAS       -LATE PRESENTING MYOCARDIAL INFARCTION       -COPD   CLINICAL FOLLOW-UP RECOMMENDED.   CK     Status: None   Collection Time: 12/31/14 10:05 PM  Result Value Ref Range   Total CK 73 49 - 397 U/L  Urinalysis, Routine w reflex microscopic (not at [facility])     Status: None   Collection Time: 12/31/14 10:28 PM  Result Value Ref Range   Color, Urine YELLOW YELLOW   APPearance CLEAR CLEAR   Specific Gravity, Urine 1.015 1.005 - 1.030   pH 5.0 5.0 - 8.0   Glucose, UA NEGATIVE NEGATIVE mg/dL   Hgb urine dipstick NEGATIVE NEGATIVE   Bilirubin Urine NEGATIVE NEGATIVE   Ketones, ur NEGATIVE NEGATIVE mg/dL   Protein, ur NEGATIVE NEGATIVE mg/dL   Urobilinogen, UA 0.2 0.0 - 1.0 mg/dL   Nitrite NEGATIVE NEGATIVE   Leukocytes, UA NEGATIVE NEGATIVE    Comment: MICROSCOPIC NOT DONE ON URINES WITH NEGATIVE PROTEIN, BLOOD, LEUKOCYTES,  NITRITE, OR GLUCOSE <1000 mg/dL.  CBC     Status: Abnormal   Collection Time: 01/01/15 12:02 AM  Result Value Ref Range   WBC 9.2 4.0 - 10.5 K/uL   RBC 4.57 4.22 - 5.81 MIL/uL   Hemoglobin 11.1 (L) 13.0 - 17.0 g/dL   HCT 34.6 (L) 39.0 - 52.0 %   MCV 75.7 (L) 78.0 - 100.0 fL   MCH 24.3 (L) 26.0 - 34.0 pg   MCHC 32.1 30.0 - 36.0 g/dL   RDW 17.6 (H) 11.5 - 15.5 %   Platelets 214 150 - 400 K/uL    Radiology Reports: Dg Chest 2 View  12/31/2014   CLINICAL DATA:  Shortness of breath for several months on off after activity including walking, worse recently, past history lung cancer, diabetes mellitus  EXAM: CHEST  2 VIEW  COMPARISON:  04/08/2009  FINDINGS: Upper normal heart size.  Mediastinal contours and pulmonary vascularity normal.  LEFT basilar atelectasis.  Remaining lungs clear.  No pleural effusion or pneumothorax.  IMPRESSION: LEFT basilar atelectasis.  Previous identified LEFT perihilar mass/tumor no longer identified.   Electronically Signed   By: Lavonia Dana M.D.   On: 12/31/2014 16:30   Ct Angio Chest Pe W/cm &/or Wo Cm  12/31/2014   CLINICAL DATA:  Right chest pain and mid chest pressure radiating into the back and down the right torso. Known abdominal aortic aneurysm.  EXAM: CT ANGIOGRAPHY CHEST  CT ABDOMEN AND PELVIS WITH CONTRAST  TECHNIQUE: Multidetector CT imaging of the chest was performed using the standard protocol during bolus administration of intravenous contrast. Multiplanar CT image reconstructions and MIPs were obtained to evaluate the vascular anatomy. Multidetector CT imaging of the abdomen and pelvis was performed using the standard protocol during bolus administration of  intravenous contrast.  CONTRAST:  131m OMNIPAQUE IOHEXOL 350 MG/ML SOLN  COMPARISON:  04/08/2009  FINDINGS: CTA CHEST FINDINGS  Cardiovascular: There is a large volume pulmonary embolus within the right pulmonary arterial system, involving upper lobe, middle lobe and lower lobe. No left-sided pulmonary  emboli are evident. There is CT evidence of right heart strain. The RV:LV ratio is 1.1.  Lungs: There is prior left lower lobectomy. There is mild curvilinear scarring in the left base. No confluent airspace consolidation.  Central airways: Patent  Effusions: None  Lymphadenopathy: None  Esophagus: Unremarkable  Musculoskeletal: No significant abnormality.  CT ABDOMEN and PELVIS FINDINGS  Hepatobiliary: There is prior cholecystectomy. The liver and bile ducts appear normal.  Pancreas: Normal  Spleen: Normal  Adrenals/Urinary Tract: The adrenals and kidneys are normal in appearance. There is no urinary calculus evident. There is no hydronephrosis or ureteral dilatation. Collecting systems and ureters appear unremarkable. There is a diverticulum at the right posterolateral aspect of the urinary bladder just anterior to the ureterovesical junction.  Stomach/Bowel: There are normal appearances of the stomach, small bowel and colon. The appendix is normal.  Vascular/Lymphatic: There is a 3.2 cm infrarenal abdominal aortic aneurysm without dissection or leak. There is mild atherosclerotic calcification.  Reproductive: Unremarkable  Other: No acute inflammatory changes are evident in the abdomen or pelvis. There is no adenopathy. There is no ascites.  Musculoskeletal: There is posterior decompression of the lumbar spine from L3 through the sacrum with instrumented fusion from L4 through the sacrum. There is a fat containing left inguinal hernia  Review of the MIP images confirms the above findings.  IMPRESSION: 1. Large volume pulmonary embolism to the right pulmonary arterial system, with CT evidence of right heart strain. These results were called by telephone at the time of interpretation on 12/31/2014 at 11:20 pm to Dr. RTheotis Burrow, who verbally acknowledged these results. Positive for acute PE with CT evidence of right heart strain (RV/LV Ratio = 1.1) consistent with at least submassive (intermediate risk) PE. The  presence of right heart strain has been associated with an increased risk of morbidity and mortality. Please activate Code PE by paging [phone] 2. Left lower lobectomy. No evidence of active neoplastic disease in the chest. 3. 3.2 cm infrarenal abdominal aortic aneurysm 4. Right-sided urinary bladder Hutch diverticulum 5. Fat containing left inguinal hernia.   Electronically Signed   By: DAndreas NewportM.D.   On: 12/31/2014 23:25   Ct Abdomen Pelvis W Contrast  12/31/2014   CLINICAL DATA:  Right chest pain and mid chest pressure radiating into the back and down the right torso. Known abdominal aortic aneurysm.  EXAM: CT ANGIOGRAPHY CHEST  CT ABDOMEN AND PELVIS WITH CONTRAST  TECHNIQUE: Multidetector CT imaging of the chest was performed using the standard protocol during bolus administration of intravenous contrast. Multiplanar CT image reconstructions and MIPs were obtained to evaluate the vascular anatomy. Multidetector CT imaging of the abdomen and pelvis was performed using the standard protocol during bolus administration of intravenous contrast.  CONTRAST:  1046mOMNIPAQUE IOHEXOL 350 MG/ML SOLN  COMPARISON:  04/08/2009  FINDINGS: CTA CHEST FINDINGS  Cardiovascular: There is a large volume pulmonary embolus within the right pulmonary arterial system, involving upper lobe, middle lobe and lower lobe. No left-sided pulmonary emboli are evident. There is CT evidence of right heart strain. The RV:LV ratio is 1.1.  Lungs: There is prior left lower lobectomy. There is mild curvilinear scarring in the left base. No confluent airspace consolidation.  Central airways: Patent  Effusions: None  Lymphadenopathy: None  Esophagus: Unremarkable  Musculoskeletal: No significant abnormality.  CT ABDOMEN and PELVIS FINDINGS  Hepatobiliary: There is prior cholecystectomy. The liver and bile ducts appear normal.  Pancreas: Normal  Spleen: Normal  Adrenals/Urinary Tract: The adrenals and kidneys are normal in  appearance. There is no urinary calculus evident. There is no hydronephrosis or ureteral dilatation. Collecting systems and ureters appear unremarkable. There is a diverticulum at the right posterolateral aspect of the urinary bladder just anterior to the ureterovesical junction.  Stomach/Bowel: There are normal appearances of the stomach, small bowel and colon. The appendix is normal.  Vascular/Lymphatic: There is a 3.2 cm infrarenal abdominal aortic aneurysm without dissection or leak. There is mild atherosclerotic calcification.  Reproductive: Unremarkable  Other: No acute inflammatory changes are evident in the abdomen or pelvis. There is no adenopathy. There is no ascites.  Musculoskeletal: There is posterior decompression of the lumbar spine from L3 through the sacrum with instrumented fusion from L4 through the sacrum. There is a fat containing left inguinal hernia  Review of the MIP images confirms the above findings.  IMPRESSION: 1. Large volume pulmonary embolism to the right pulmonary arterial system, with CT evidence of right heart strain. These results were called by telephone at the time of interpretation on 12/31/2014 at 11:20 pm to Dr. Theotis Burrow , who verbally acknowledged these results. Positive for acute PE with CT evidence of right heart strain (RV/LV Ratio = 1.1) consistent with at least submassive (intermediate risk) PE. The presence of right heart strain has been associated with an increased risk of morbidity and mortality. Please activate Code PE by paging [phone]. 2. Left lower lobectomy. No evidence of active neoplastic disease in the chest. 3. 3.2 cm infrarenal abdominal aortic aneurysm 4. Right-sided urinary bladder Hutch diverticulum 5. Fat containing left inguinal hernia.   Electronically Signed   By: Andreas Newport M.D.   On: 12/31/2014 23:25    My interpretation of Electrocardiogram: Sinus rhythm in the 90s. Left axis deviation. Normal intervals. No Q waves. No concerning  ST or T-wave changes.  Problem List  Principal Problem:   Acute pulmonary embolism Active Problems:   DM type 2 (diabetes mellitus, type 2)   Gout   History of lung cancer   Assessment: This is a 64 year old Caucasian male with a past medical history of diabetes and lung cancer who presents with chest pain, back pain and is found to have acute pulmonary embolism with evidence for right heart strain. His history of lung cancer is probably contributing. No recurrence of the cancer noted on the CT scan.  Plan: #1 acute pulmonary embolism with right heart strain: Troponin mildly elevated. This will be trended. He'll be admitted to step down unit. IV heparin has been initiated. Echocardiogram will be ordered. Oxygen as needed. Pain control. Lower extremity venous Dopplers.  #2 History of diabetes mellitus type 2: Hold his metformin since he got IV contrast. Sliding scale coverage. HbA1c.  #3 history of lung cancer: Status post surgery 5 years ago. No chemotherapy in the last 3 years. Follow-up with his providers at the [place].  #4 history of chronic back pain and neck pain: Continue with gabapentin. Pain medications as needed. PT and OT to evaluate. No neurological deficits noted.  DVT Prophylaxis: He'll be on full anticoagulation Code Status: Full code Family Communication: Discussed with the patient  Disposition Plan: Admit to stepdown   Further management decisions will depend on results of further  testing and patient's response to treatment.   [facility]  [facility] Pager [phone]  If 7PM-7AM, please contact night-coverage [email or web address] Password [email or web address]  01/01/2015, 12:58 AM

## 2015-01-01 NOTE — Progress Notes (Signed)
ANTICOAGULATION CONSULT NOTE - Follow Up Consult  Pharmacy Consult  :  Heparin Indication  :  pulmonary embolism   Heparin Dosing Weight: 95 kg   Recent Labs  12/31/14 1602 01/01/15 0002 01/01/15 0235 01/01/15 1030 01/01/15 1800  HGB 12.0* 11.1*  --   --   --   HCT 37.7* 34.6*  --   --   --   PLT 257 214  --   --   --   HEPARINUNFRC  --   --   --  0.21* 0.35  CREATININE 1.42*  --  1.38*  --   --    Infusions:  . heparin 1,600 Units/hr (01/01/15 2000)   Assessment:  64 yo male on a heparin infusion for a large right-sided PE.  Heparin rate 1600 units/hr, Heparin level 0.35 units/ml.  This is within the therapeutic range.  No evidence of bleeding complications noted   Goal:  Heparin level 0.3-0.7 units/ml   Plan: 1. Continue Heparin at 1600 units/hr.  Next Heparin level with the AM labs. 2. Continue Daily Heparin Levels, Platelet counts, CBC.  Monitor for bleeding complications    Estelle June,  Pharm.D  01/01/2015, 8:43 PM

## 2015-01-01 NOTE — Progress Notes (Signed)
ANTICOAGULATION CONSULT NOTE - Follow Up Consult  Pharmacy Consult for heparin Indication: PE  Allergies  Allergen Reactions  . Epinephrine Anaphylaxis  . Sulfa Antibiotics Other (See Comments)    unknown  . Hydrocodone Itching and Rash    High strength dosing causes reaction  . Morphine Other (See Comments)    unknown    Patient Measurements: Height: '5\' 9"'$  (175.3 cm) Weight: 242 lb 8.1 oz (110 kg) IBW/kg (Calculated) : 70.7 Heparin Dosing Weight: 95 kg  Vital Signs: Temp: 98 F (36.7 C) (08/06 1142) Temp Source: Axillary (08/06 1142) BP: 108/64 mmHg (08/06 1142) Pulse Rate: 70 (08/06 1000)  Labs:  Recent Labs  12/31/14 1602 12/31/14 2205 01/01/15 0002 01/01/15 0235 01/01/15 1030  HGB 12.0*  --  11.1*  --   --   HCT 37.7*  --  34.6*  --   --   PLT 257  --  214  --   --   HEPARINUNFRC  --   --   --   --  0.21*  CREATININE 1.42*  --   --  1.38*  --   CKTOTAL  --  73  --   --   --   TROPONINI  --  0.05*  --  0.05* 0.04*    Estimated Creatinine Clearance: 64.3 mL/min (by C-G formula based on Cr of 1.38).   Assessment: 64 yo m on heparin for large right-sided PE. HL is subtherapeutic at 0.21.  CBC stable.  Goal of Therapy:  Heparin level 0.3-0.7 units/ml Monitor platelets by anticoagulation protocol: Yes   Plan:  Heparin bolus 1500 units x 1 Increase heparin infusion to 1600 units/hr 6-hr HL @ 1800 Monitor s/s of bleeding  Alyssia Heese L. Nicole Kindred, PharmD Clinical Pharmacy Resident Pager: [phone] 01/01/2015 12:11 PM

## 2015-01-01 NOTE — Progress Notes (Signed)
[facility] PROGRESS NOTE  KANTON KAMEL GYJ:[id number] DOB: 05-26-1951 DOA: 12/31/2014 PCP: No primary care provider on file.  Assessment/Plan: #1 acute pulmonary embolism with right heart strain: Troponin mildly elevated. Echocardiogram ordered, pending. Cont oxygen as needed. Pain control. Lower extremity venous Dopplers.  #2 History of diabetes mellitus type 2: Metformin was on hold since he got IV contrast. Sliding scale coverage. HbA1c.  #3 history of lung cancer: Status post surgery 5 years ago. No chemotherapy in the last 3 years. Follow-up with his providers at the [place].  #4 history of chronic back pain and neck pain: Continue with gabapentin. Pain medications as needed. PT and OT to evaluate. No neurological deficits noted.  Code Status: Full Family Communication: Pt in room  Disposition Plan: Pending   Consultants:    Procedures:    Antibiotics:   (indicate start date, and stop date if known)  HPI/Subjective: No complaints  Objective: Filed Vitals:   01/01/15 0945 01/01/15 1000 01/01/15 1142 01/01/15 1619  BP: 137/82 123/77 108/64 107/70  Pulse: 72 70    Temp:   98 F (36.7 C) 98.1 F (36.7 C)  TempSrc:   Axillary Oral  Resp: '11 18 18 17  '$ Height:      Weight:      SpO2: 97% 98% 98% 96%    Intake/Output Summary (Last 24 hours) at 01/01/15 1632 Last data filed at 01/01/15 1300  Gross per 24 hour  Intake 2648.01 ml  Output   1100 ml  Net 1548.01 ml   Filed Weights   01/01/15 0100  Weight: 110 kg (242 lb 8.1 oz)    Exam:   General:  Awake, in nad  Cardiovascular: regular, s1, s2  Respiratory: normal resp effort, no wheezing  Abdomen: soft,nondistended  Musculoskeletal: perfused, no clubbing   Data Reviewed: Basic Metabolic Panel:  Recent Labs Lab 12/31/14 1602 01/01/15 0235  NA 135 137  K 3.8 4.0  CL 103 106  CO2 21* 20*  GLUCOSE 224* 179*  BUN 18 16  CREATININE 1.42* 1.38*  CALCIUM 9.3 8.6*   Liver Function  Tests:  Recent Labs Lab 01/01/15 0235  AST 23  ALT 14*  ALKPHOS 78  BILITOT 0.5  PROT 6.1*  ALBUMIN 3.4*   No results for input(s): LIPASE, AMYLASE in the last 168 hours. No results for input(s): AMMONIA in the last 168 hours. CBC:  Recent Labs Lab 12/31/14 1602 01/01/15 0002  WBC 10.8* 9.2  HGB 12.0* 11.1*  HCT 37.7* 34.6*  MCV 77.1* 75.7*  PLT 257 214   Cardiac Enzymes:  Recent Labs Lab 12/31/14 2205 01/01/15 0235 01/01/15 1030 01/01/15 1430  CKTOTAL 73  --   --   --   TROPONINI 0.05* 0.05* 0.04* 0.04*   BNP (last 3 results)  Recent Labs  12/31/14 1602  BNP 60.5    ProBNP (last 3 results) No results for input(s): PROBNP in the last 8760 hours.  CBG:  Recent Labs Lab 01/01/15 0748 01/01/15 1134  GLUCAP 176* 139*    Recent Results (from the past 240 hour(s))  MRSA PCR Screening     Status: None   Collection Time: 01/01/15  1:19 AM  Result Value Ref Range Status   MRSA by PCR NEGATIVE NEGATIVE Final    Comment:        The GeneXpert MRSA Assay (FDA approved for NASAL specimens only), is one component of a comprehensive MRSA colonization surveillance program. It is not intended to diagnose MRSA infection nor to  guide or monitor treatment for MRSA infections.      Studies: Dg Chest 2 View  12/31/2014   CLINICAL DATA:  Shortness of breath for several months on off after activity including walking, worse recently, past history lung cancer, diabetes mellitus  EXAM: CHEST  2 VIEW  COMPARISON:  04/08/2009  FINDINGS: Upper normal heart size.  Mediastinal contours and pulmonary vascularity normal.  LEFT basilar atelectasis.  Remaining lungs clear.  No pleural effusion or pneumothorax.  IMPRESSION: LEFT basilar atelectasis.  Previous identified LEFT perihilar mass/tumor no longer identified.   Electronically Signed   By: Lavonia Dana M.D.   On: 12/31/2014 16:30   Ct Angio Chest Pe W/cm &/or Wo Cm  12/31/2014   CLINICAL DATA:  Right chest pain and mid  chest pressure radiating into the back and down the right torso. Known abdominal aortic aneurysm.  EXAM: CT ANGIOGRAPHY CHEST  CT ABDOMEN AND PELVIS WITH CONTRAST  TECHNIQUE: Multidetector CT imaging of the chest was performed using the standard protocol during bolus administration of intravenous contrast. Multiplanar CT image reconstructions and MIPs were obtained to evaluate the vascular anatomy. Multidetector CT imaging of the abdomen and pelvis was performed using the standard protocol during bolus administration of intravenous contrast.  CONTRAST:  147m OMNIPAQUE IOHEXOL 350 MG/ML SOLN  COMPARISON:  04/08/2009  FINDINGS: CTA CHEST FINDINGS  Cardiovascular: There is a large volume pulmonary embolus within the right pulmonary arterial system, involving upper lobe, middle lobe and lower lobe. No left-sided pulmonary emboli are evident. There is CT evidence of right heart strain. The RV:LV ratio is 1.1.  Lungs: There is prior left lower lobectomy. There is mild curvilinear scarring in the left base. No confluent airspace consolidation.  Central airways: Patent  Effusions: None  Lymphadenopathy: None  Esophagus: Unremarkable  Musculoskeletal: No significant abnormality.  CT ABDOMEN and PELVIS FINDINGS  Hepatobiliary: There is prior cholecystectomy. The liver and bile ducts appear normal.  Pancreas: Normal  Spleen: Normal  Adrenals/Urinary Tract: The adrenals and kidneys are normal in appearance. There is no urinary calculus evident. There is no hydronephrosis or ureteral dilatation. Collecting systems and ureters appear unremarkable. There is a diverticulum at the right posterolateral aspect of the urinary bladder just anterior to the ureterovesical junction.  Stomach/Bowel: There are normal appearances of the stomach, small bowel and colon. The appendix is normal.  Vascular/Lymphatic: There is a 3.2 cm infrarenal abdominal aortic aneurysm without dissection or leak. There is mild atherosclerotic calcification.   Reproductive: Unremarkable  Other: No acute inflammatory changes are evident in the abdomen or pelvis. There is no adenopathy. There is no ascites.  Musculoskeletal: There is posterior decompression of the lumbar spine from L3 through the sacrum with instrumented fusion from L4 through the sacrum. There is a fat containing left inguinal hernia  Review of the MIP images confirms the above findings.  IMPRESSION: 1. Large volume pulmonary embolism to the right pulmonary arterial system, with CT evidence of right heart strain. These results were called by telephone at the time of interpretation on 12/31/2014 at 11:20 pm to Dr. RTheotis Burrow, who verbally acknowledged these results. Positive for acute PE with CT evidence of right heart strain (RV/LV Ratio = 1.1) consistent with at least submassive (intermediate risk) PE. The presence of right heart strain has been associated with an increased risk of morbidity and mortality. Please activate Code PE by paging [phone] 2. Left lower lobectomy. No evidence of active neoplastic disease in the chest. 3. 3.2 cm infrarenal  abdominal aortic aneurysm 4. Right-sided urinary bladder Hutch diverticulum 5. Fat containing left inguinal hernia.   Electronically Signed   By: Andreas Newport M.D.   On: 12/31/2014 23:25   Ct Abdomen Pelvis W Contrast  12/31/2014   CLINICAL DATA:  Right chest pain and mid chest pressure radiating into the back and down the right torso. Known abdominal aortic aneurysm.  EXAM: CT ANGIOGRAPHY CHEST  CT ABDOMEN AND PELVIS WITH CONTRAST  TECHNIQUE: Multidetector CT imaging of the chest was performed using the standard protocol during bolus administration of intravenous contrast. Multiplanar CT image reconstructions and MIPs were obtained to evaluate the vascular anatomy. Multidetector CT imaging of the abdomen and pelvis was performed using the standard protocol during bolus administration of intravenous contrast.  CONTRAST:  114m OMNIPAQUE IOHEXOL 350  MG/ML SOLN  COMPARISON:  04/08/2009  FINDINGS: CTA CHEST FINDINGS  Cardiovascular: There is a large volume pulmonary embolus within the right pulmonary arterial system, involving upper lobe, middle lobe and lower lobe. No left-sided pulmonary emboli are evident. There is CT evidence of right heart strain. The RV:LV ratio is 1.1.  Lungs: There is prior left lower lobectomy. There is mild curvilinear scarring in the left base. No confluent airspace consolidation.  Central airways: Patent  Effusions: None  Lymphadenopathy: None  Esophagus: Unremarkable  Musculoskeletal: No significant abnormality.  CT ABDOMEN and PELVIS FINDINGS  Hepatobiliary: There is prior cholecystectomy. The liver and bile ducts appear normal.  Pancreas: Normal  Spleen: Normal  Adrenals/Urinary Tract: The adrenals and kidneys are normal in appearance. There is no urinary calculus evident. There is no hydronephrosis or ureteral dilatation. Collecting systems and ureters appear unremarkable. There is a diverticulum at the right posterolateral aspect of the urinary bladder just anterior to the ureterovesical junction.  Stomach/Bowel: There are normal appearances of the stomach, small bowel and colon. The appendix is normal.  Vascular/Lymphatic: There is a 3.2 cm infrarenal abdominal aortic aneurysm without dissection or leak. There is mild atherosclerotic calcification.  Reproductive: Unremarkable  Other: No acute inflammatory changes are evident in the abdomen or pelvis. There is no adenopathy. There is no ascites.  Musculoskeletal: There is posterior decompression of the lumbar spine from L3 through the sacrum with instrumented fusion from L4 through the sacrum. There is a fat containing left inguinal hernia  Review of the MIP images confirms the above findings.  IMPRESSION: 1. Large volume pulmonary embolism to the right pulmonary arterial system, with CT evidence of right heart strain. These results were called by telephone at the time of  interpretation on 12/31/2014 at 11:20 pm to Dr. RTheotis Burrow, who verbally acknowledged these results. Positive for acute PE with CT evidence of right heart strain (RV/LV Ratio = 1.1) consistent with at least submassive (intermediate risk) PE. The presence of right heart strain has been associated with an increased risk of morbidity and mortality. Please activate Code PE by paging [phone] 2. Left lower lobectomy. No evidence of active neoplastic disease in the chest. 3. 3.2 cm infrarenal abdominal aortic aneurysm 4. Right-sided urinary bladder Hutch diverticulum 5. Fat containing left inguinal hernia.   Electronically Signed   By: DAndreas NewportM.D.   On: 12/31/2014 23:25    Scheduled Meds: . allopurinol  300 mg Oral Daily  . docusate sodium  100 mg Oral BID  . gabapentin  600 mg Oral Daily   And  . gabapentin  900 mg Oral QHS  . insulin aspart  0-15 Units Subcutaneous TID WC  .  pantoprazole  40 mg Oral Daily  . sertraline  100 mg Oral Daily  . sodium chloride  3 mL Intravenous Q12H  . [START ON 01/02/2015] vitamin B-12  1,000 mcg Oral Daily   Continuous Infusions: . heparin 1,600 Units/hr (01/01/15 1306)    Principal Problem:   Acute pulmonary embolism Active Problems:   DM type 2 (diabetes mellitus, type 2)   Gout   History of lung cancer   Regina Coppolino, [facility] Pager [phone]. If 7PM-7AM, please contact night-coverage at [email or web address], password [facility] 01/01/2015, 4:32 PM  LOS: 0 days

## 2015-01-01 NOTE — ED Notes (Signed)
All belongings escorted up to floor with patient.

## 2015-01-01 NOTE — Progress Notes (Signed)
PT Cancellation Note  Patient Details Name: Harold Morris MRN: [id number] DOB: 08/17/1950   Cancelled Treatment:    Reason Eval/Treat Not Completed: Medical issues which prohibited therapy (patient with acute PE and INR not therapeutic)   Shanna Cisco 01/01/2015, 2:29 PM

## 2015-01-01 NOTE — Progress Notes (Signed)
VASCULAR LAB PRELIMINARY  PRELIMINARY  PRELIMINARY  PRELIMINARY  Bilateral lower extremity venous duplex completed.    Preliminary report:  There is no obvious evidence of DVT or SVT noted in the bilateral lower extremities.   Matthan Sledge, RVT 01/01/2015, 1:30 PM

## 2015-01-02 ENCOUNTER — Inpatient Hospital Stay (INDEPENDENT_AMBULATORY_CARE_PROVIDER_SITE_OTHER): Payer: Medicare Other

## 2015-01-02 DIAGNOSIS — I2699 Other pulmonary embolism without acute cor pulmonale: Secondary | ICD-10-CM | POA: Diagnosis not present

## 2015-01-02 DIAGNOSIS — Z85118 Personal history of other malignant neoplasm of bronchus and lung: Secondary | ICD-10-CM

## 2015-01-02 LAB — GLUCOSE, CAPILLARY
Glucose-Capillary: 113 mg/dL — ABNORMAL HIGH (ref 65–99)
Glucose-Capillary: 205 mg/dL — ABNORMAL HIGH (ref 65–99)
Glucose-Capillary: 133 mg/dL — ABNORMAL HIGH (ref 65–99)
Glucose-Capillary: 136 mg/dL — ABNORMAL HIGH (ref 65–99)

## 2015-01-02 LAB — CBC
HCT: 34.9 % — ABNORMAL LOW (ref 39.0–52.0)
HEMOGLOBIN: 10.8 g/dL — AB (ref 13.0–17.0)
MCH: 23.6 pg — AB (ref 26.0–34.0)
MCHC: 30.9 g/dL (ref 30.0–36.0)
MCV: 76.4 fL — ABNORMAL LOW (ref 78.0–100.0)
Platelets: 241 10*3/uL (ref 150–400)
RBC: 4.57 MIL/uL (ref 4.22–5.81)
RDW: 17.7 % — ABNORMAL HIGH (ref 11.5–15.5)
WBC: 8.6 10*3/uL (ref 4.0–10.5)

## 2015-01-02 LAB — HEPARIN LEVEL (UNFRACTIONATED)
HEPARIN UNFRACTIONATED: 0.32 [IU]/mL (ref 0.30–0.70)
HEPARIN UNFRACTIONATED: 0.48 [IU]/mL (ref 0.30–0.70)
Heparin Unfractionated: 0.44 IU/mL (ref 0.30–0.70)

## 2015-01-02 MED ORDER — HYDROMORPHONE HCL 1 MG/ML IJ SOLN: 0.5000 mg | INTRAMUSCULAR | Status: DC | PRN

## 2015-01-02 NOTE — Progress Notes (Addendum)
ANTICOAGULATION CONSULT NOTE - Initial Consult  Pharmacy Consult for Heparin  Indication: pulmonary embolus  Allergies  Allergen Reactions  . Epinephrine Anaphylaxis  . Sulfa Antibiotics Other (See Comments)    unknown  . Hydrocodone Itching and Rash    High strength dosing causes reaction  . Morphine Other (See Comments)    unknown    Patient Measurements: Pt reported: ~106 kg  Vital Signs: Temp: 98 F (36.7 C) (08/07 0351) Temp Source: Oral (08/07 0351) BP: 132/80 mmHg (08/07 0351) Pulse Rate: 77 (08/07 0351)  Labs:  Recent Labs  12/31/14 1602  12/31/14 2205 01/01/15 0002 01/01/15 0235 01/01/15 1030 01/01/15 1430 01/01/15 1800 01/02/15 0400  HGB 12.0*  --   --  11.1*  --   --   --   --  10.8*  HCT 37.7*  --   --  34.6*  --   --   --   --  34.9*  PLT 257  --   --  214  --   --   --   --  241  HEPARINUNFRC  --   --   --   --   --  0.21*  --  0.35 0.32  CREATININE 1.42*  --   --   --  1.38*  --   --   --   --   CKTOTAL  --   --  73  --   --   --   --   --   --   TROPONINI  --   < > 0.05*  --  0.05* 0.04* 0.04*  --   --   < > = values in this interval not displayed.  Estimated Creatinine Clearance: 64.3 mL/min (by C-G formula based on Cr of 1.38).   Medical History: Past Medical History  Diagnosis Date  . Lung cancer   . Diabetes mellitus without complication   . High cholesterol   . Gout      Assessment: 64 y/o M with h/o lung CA s/p resection, found to have large right-sided PE on CT Angio, Hgb 12, mildly elevated Scr, other labs reviewed, heparin level is therapeutic x 2 this AM but is at the lower end of therapeutic range, will increase slightly to prevent sub-therapeutic HL with large PE.   Goal of Therapy:  Heparin level 0.3-0.7 units/ml Monitor platelets by anticoagulation protocol: Yes   Plan:  -Increase heparin drip to 1750 units/hr -Check 1300 HL to ensure therapeutic with large PE -Daily CBC/HL -Monitor for bleeding  Ledford,  James 01/02/2015,5:35 AM  1300 HL therapeutic at 0.44.  Continue on 1750 units/hr.  Check a confirmatory HL at 2000.  Cassie L. Nicole Kindred, PharmD Clinical Pharmacy Resident Pager: [phone] 01/02/2015 1:57 PM   Addendum: Confirmatory heparin level 0.48, remains therapeutic.  Plan: Continue current rate F/u AM labs.  Maryanna Shape, PharmD, BCPS  Clinical Pharmacist  Pager: [phone]

## 2015-01-02 NOTE — Progress Notes (Signed)
  Echocardiogram 2D Echocardiogram has been performed.  Jennette Dubin 01/02/2015, 10:14 AM

## 2015-01-02 NOTE — Progress Notes (Signed)
01/02/2015 11:34 PM Patient received norco 1 tab for pain of 5 at 2203.  About an hour later when he laid down his chest and back were hurting at a pain level of 5 to 6 and it hurt more when he took large breathes.  Pts vital signs were normal.  Pt had no IV pain meds and is allergic to morphine, which causes hallucinations.  Dr. Gerald Dexter was informed and she ordered to start on dilaudid 0.5 mg IV every 3 hours as needed for moderate to severe pain.  Will continue to monitor patient. Lupita Dawn

## 2015-01-02 NOTE — Progress Notes (Signed)
[facility] PROGRESS NOTE  Harold Morris TKW:[id number] DOB: [birth year] DOA: 12/31/2014 PCP: No primary care provider on file.  Assessment/Plan: #1 acute pulmonary embolism: Troponin mildly elevated. Echocardiogram ordered, findings of grade 2 diastolic dysfunction. Cont oxygen as needed. Pain control. Lower extremity venous Dopplers neg for DVT. Will likely need life-long anticoagulation  #2 History of diabetes mellitus type 2: Metformin was on hold since he got IV contrast. Sliding scale coverage. HbA1c pending  #3 history of lung cancer: Status post surgery 5 years ago. No chemotherapy in the last 3 years. Follow-up with his providers at the [place].  #4 history of chronic back pain and neck pain: Continue with gabapentin. Pain medications as needed. PT and OT to evaluate. No neurological deficits noted.  Code Status: Full Family Communication: Pt in room, family at bedside Disposition Plan: Pending   Consultants:    Procedures:    Antibiotics:   (indicate start date, and stop date if known)  HPI/Subjective: No sob or chest pain  Objective: Filed Vitals:   01/02/15 0351 01/02/15 0800 01/02/15 0900 01/02/15 1145  BP: 132/80  133/80 107/68  Pulse: 77  69   Temp: 98 F (36.7 C)  97.7 F (36.5 C) 97.9 F (36.6 C)  TempSrc: Oral  Oral Oral  Resp:  '17 16 19  '$ Height:      Weight:      SpO2:   99% 99%    Intake/Output Summary (Last 24 hours) at 01/02/15 1534 Last data filed at 01/02/15 1300  Gross per 24 hour  Intake   1128 ml  Output   2425 ml  Net  -1297 ml   Filed Weights   01/01/15 0100  Weight: 110 kg (242 lb 8.1 oz)    Exam:   General:  Awake, laying in bed, in nad  Cardiovascular: regular, s1, s2  Respiratory: normal resp effort, no wheezing  Abdomen: soft,nondistended  Musculoskeletal: perfused, no clubbing, no cyanosis  Data Reviewed: Basic Metabolic Panel:  Recent Labs Lab 12/31/14 1602 01/01/15 0235  NA 135 137  K 3.8 4.0  CL  103 106  CO2 21* 20*  GLUCOSE 224* 179*  BUN 18 16  CREATININE 1.42* 1.38*  CALCIUM 9.3 8.6*   Liver Function Tests:  Recent Labs Lab 01/01/15 0235  AST 23  ALT 14*  ALKPHOS 78  BILITOT 0.5  PROT 6.1*  ALBUMIN 3.4*   No results for input(s): LIPASE, AMYLASE in the last 168 hours. No results for input(s): AMMONIA in the last 168 hours. CBC:  Recent Labs Lab 12/31/14 1602 01/01/15 0002 01/02/15 0400  WBC 10.8* 9.2 8.6  HGB 12.0* 11.1* 10.8*  HCT 37.7* 34.6* 34.9*  MCV 77.1* 75.7* 76.4*  PLT 257 214 241   Cardiac Enzymes:  Recent Labs Lab 12/31/14 2205 01/01/15 0235 01/01/15 1030 01/01/15 1430  CKTOTAL 73  --   --   --   TROPONINI 0.05* 0.05* 0.04* 0.04*   BNP (last 3 results)  Recent Labs  12/31/14 1602  BNP 60.5    ProBNP (last 3 results) No results for input(s): PROBNP in the last 8760 hours.  CBG:  Recent Labs Lab 01/01/15 1134 01/01/15 1621 01/01/15 2137 01/02/15 0744 01/02/15 1142  GLUCAP 139* 151* 133* 113* 205*    Recent Results (from the past 240 hour(s))  MRSA PCR Screening     Status: None   Collection Time: 01/01/15  1:19 AM  Result Value Ref Range Status   MRSA by PCR NEGATIVE NEGATIVE Final  Comment:        The GeneXpert MRSA Assay (FDA approved for NASAL specimens only), is one component of a comprehensive MRSA colonization surveillance program. It is not intended to diagnose MRSA infection nor to guide or monitor treatment for MRSA infections.      Studies: Dg Chest 2 View  12/31/2014   CLINICAL DATA:  Shortness of breath for several months on off after activity including walking, worse recently, past history lung cancer, diabetes mellitus  EXAM: CHEST  2 VIEW  COMPARISON:  04/08/2009  FINDINGS: Upper normal heart size.  Mediastinal contours and pulmonary vascularity normal.  LEFT basilar atelectasis.  Remaining lungs clear.  No pleural effusion or pneumothorax.  IMPRESSION: LEFT basilar atelectasis.  Previous  identified LEFT perihilar mass/tumor no longer identified.   Electronically Signed   By: Lavonia Dana M.D.   On: 12/31/2014 16:30   Ct Angio Chest Pe W/cm &/or Wo Cm  12/31/2014   CLINICAL DATA:  Right chest pain and mid chest pressure radiating into the back and down the right torso. Known abdominal aortic aneurysm.  EXAM: CT ANGIOGRAPHY CHEST  CT ABDOMEN AND PELVIS WITH CONTRAST  TECHNIQUE: Multidetector CT imaging of the chest was performed using the standard protocol during bolus administration of intravenous contrast. Multiplanar CT image reconstructions and MIPs were obtained to evaluate the vascular anatomy. Multidetector CT imaging of the abdomen and pelvis was performed using the standard protocol during bolus administration of intravenous contrast.  CONTRAST:  131m OMNIPAQUE IOHEXOL 350 MG/ML SOLN  COMPARISON:  04/08/2009  FINDINGS: CTA CHEST FINDINGS  Cardiovascular: There is a large volume pulmonary embolus within the right pulmonary arterial system, involving upper lobe, middle lobe and lower lobe. No left-sided pulmonary emboli are evident. There is CT evidence of right heart strain. The RV:LV ratio is 1.1.  Lungs: There is prior left lower lobectomy. There is mild curvilinear scarring in the left base. No confluent airspace consolidation.  Central airways: Patent  Effusions: None  Lymphadenopathy: None  Esophagus: Unremarkable  Musculoskeletal: No significant abnormality.  CT ABDOMEN and PELVIS FINDINGS  Hepatobiliary: There is prior cholecystectomy. The liver and bile ducts appear normal.  Pancreas: Normal  Spleen: Normal  Adrenals/Urinary Tract: The adrenals and kidneys are normal in appearance. There is no urinary calculus evident. There is no hydronephrosis or ureteral dilatation. Collecting systems and ureters appear unremarkable. There is a diverticulum at the right posterolateral aspect of the urinary bladder just anterior to the ureterovesical junction.  Stomach/Bowel: There are normal  appearances of the stomach, small bowel and colon. The appendix is normal.  Vascular/Lymphatic: There is a 3.2 cm infrarenal abdominal aortic aneurysm without dissection or leak. There is mild atherosclerotic calcification.  Reproductive: Unremarkable  Other: No acute inflammatory changes are evident in the abdomen or pelvis. There is no adenopathy. There is no ascites.  Musculoskeletal: There is posterior decompression of the lumbar spine from L3 through the sacrum with instrumented fusion from L4 through the sacrum. There is a fat containing left inguinal hernia  Review of the MIP images confirms the above findings.  IMPRESSION: 1. Large volume pulmonary embolism to the right pulmonary arterial system, with CT evidence of right heart strain. These results were called by telephone at the time of interpretation on 12/31/2014 at 11:20 pm to Dr. RTheotis Burrow, who verbally acknowledged these results. Positive for acute PE with CT evidence of right heart strain (RV/LV Ratio = 1.1) consistent with at least submassive (intermediate risk) PE. The presence of  right heart strain has been associated with an increased risk of morbidity and mortality. Please activate Code PE by paging [phone]. 2. Left lower lobectomy. No evidence of active neoplastic disease in the chest. 3. 3.2 cm infrarenal abdominal aortic aneurysm 4. Right-sided urinary bladder Hutch diverticulum 5. Fat containing left inguinal hernia.   Electronically Signed   By: Andreas Newport M.D.   On: 12/31/2014 23:25   Ct Abdomen Pelvis W Contrast  12/31/2014   CLINICAL DATA:  Right chest pain and mid chest pressure radiating into the back and down the right torso. Known abdominal aortic aneurysm.  EXAM: CT ANGIOGRAPHY CHEST  CT ABDOMEN AND PELVIS WITH CONTRAST  TECHNIQUE: Multidetector CT imaging of the chest was performed using the standard protocol during bolus administration of intravenous contrast. Multiplanar CT image reconstructions and MIPs were  obtained to evaluate the vascular anatomy. Multidetector CT imaging of the abdomen and pelvis was performed using the standard protocol during bolus administration of intravenous contrast.  CONTRAST:  136m OMNIPAQUE IOHEXOL 350 MG/ML SOLN  COMPARISON:  04/08/2009  FINDINGS: CTA CHEST FINDINGS  Cardiovascular: There is a large volume pulmonary embolus within the right pulmonary arterial system, involving upper lobe, middle lobe and lower lobe. No left-sided pulmonary emboli are evident. There is CT evidence of right heart strain. The RV:LV ratio is 1.1.  Lungs: There is prior left lower lobectomy. There is mild curvilinear scarring in the left base. No confluent airspace consolidation.  Central airways: Patent  Effusions: None  Lymphadenopathy: None  Esophagus: Unremarkable  Musculoskeletal: No significant abnormality.  CT ABDOMEN and PELVIS FINDINGS  Hepatobiliary: There is prior cholecystectomy. The liver and bile ducts appear normal.  Pancreas: Normal  Spleen: Normal  Adrenals/Urinary Tract: The adrenals and kidneys are normal in appearance. There is no urinary calculus evident. There is no hydronephrosis or ureteral dilatation. Collecting systems and ureters appear unremarkable. There is a diverticulum at the right posterolateral aspect of the urinary bladder just anterior to the ureterovesical junction.  Stomach/Bowel: There are normal appearances of the stomach, small bowel and colon. The appendix is normal.  Vascular/Lymphatic: There is a 3.2 cm infrarenal abdominal aortic aneurysm without dissection or leak. There is mild atherosclerotic calcification.  Reproductive: Unremarkable  Other: No acute inflammatory changes are evident in the abdomen or pelvis. There is no adenopathy. There is no ascites.  Musculoskeletal: There is posterior decompression of the lumbar spine from L3 through the sacrum with instrumented fusion from L4 through the sacrum. There is a fat containing left inguinal hernia  Review of the  MIP images confirms the above findings.  IMPRESSION: 1. Large volume pulmonary embolism to the right pulmonary arterial system, with CT evidence of right heart strain. These results were called by telephone at the time of interpretation on 12/31/2014 at 11:20 pm to Dr. RTheotis Burrow, who verbally acknowledged these results. Positive for acute PE with CT evidence of right heart strain (RV/LV Ratio = 1.1) consistent with at least submassive (intermediate risk) PE. The presence of right heart strain has been associated with an increased risk of morbidity and mortality. Please activate Code PE by paging [phone] 2. Left lower lobectomy. No evidence of active neoplastic disease in the chest. 3. 3.2 cm infrarenal abdominal aortic aneurysm 4. Right-sided urinary bladder Hutch diverticulum 5. Fat containing left inguinal hernia.   Electronically Signed   By: DAndreas NewportM.D.   On: 12/31/2014 23:25    Scheduled Meds: . allopurinol  300 mg Oral Daily  .  docusate sodium  100 mg Oral BID  . gabapentin  600 mg Oral Daily   And  . gabapentin  900 mg Oral QHS  . insulin aspart  0-15 Units Subcutaneous TID WC  . pantoprazole  40 mg Oral Daily  . sertraline  100 mg Oral Daily  . sodium chloride  3 mL Intravenous Q12H  . vitamin B-12  1,000 mcg Oral Daily   Continuous Infusions: . heparin 1,750 Units/hr (01/02/15 1300)    Principal Problem:   Acute pulmonary embolism Active Problems:   DM type 2 (diabetes mellitus, type 2)   Gout   History of lung cancer   Roe Koffman, [facility] Pager [phone]. If 7PM-7AM, please contact night-coverage at [email or web address], password [email or web address] 01/02/2015, 3:34 PM  LOS: 1 day

## 2015-01-02 NOTE — Progress Notes (Signed)
Patient will be transferred to 2W-02 in stable condition. Report called to [facility] @ 1635. All questions/concerns answered. Belongings with patient. Plan of care to be continued upon arrival to the floor.

## 2015-01-02 NOTE — Evaluation (Signed)
Physical Therapy Evaluation Patient Details Name: Harold Morris MRN: [id number] DOB: 1951-01-04 Today's Date: 01/02/2015   History of Present Illness  acute pulmonary embolism with right heart strain. Hx of DMII, lung CA, and back pain.  Clinical Impression  Pt admitted with the above diagnosis. Pt currently with functional limitations due to the deficits listed below (see PT Problem List). Ambulates generally well with a rolling walker for support, 550 feet traversed, vital signs stable throughout gait session (SpO2 92% and greater on room air.) Good family support from wife who was present during evaluation. Pt states he still feels deconditioned compared to his baseline, but denies chest pain and SOB. Will monitor and progress while admitted.    Follow Up Recommendations Outpatient PT    Equipment Recommendations  None recommended by PT    Recommendations for Other Services       Precautions / Restrictions Precautions Precautions: None Precaution Comments: monitor O2 Restrictions Weight Bearing Restrictions: No      Mobility  Bed Mobility Overal bed mobility: Modified Independent             General bed mobility comments: HOB elevated, extra time  Transfers Overall transfer level: Needs assistance Equipment used: Rolling walker (2 wheeled) Transfers: Sit to/from Stand Sit to Stand: Supervision         General transfer comment: Supervision for safety. VC for hand placement. Stable upon standing with RW for support.  Ambulation/Gait Ambulation/Gait assistance: Supervision Ambulation Distance (Feet): 550 Feet Assistive device: Rolling walker (2 wheeled) Gait Pattern/deviations: Step-through pattern;Decreased stride length;Trunk flexed Gait velocity: decreased Gait velocity interpretation: Below normal speed for age/gender General Gait Details: VC for upright posture and to minimize reliance on RW, use only as needed for stability. Pt required 2 standing rest  breaks to complete distance. Reported increased hip discomfort bil which he says is baseline with increased distance. No chest pain. SpO2 maintained 92-100% on room air during bout.  Stairs            Wheelchair Mobility    Modified Rankin (Stroke Patients Only)       Balance Overall balance assessment: Needs assistance Sitting-balance support: No upper extremity supported;Feet supported Sitting balance-Leahy Scale: Good     Standing balance support: No upper extremity supported Standing balance-Leahy Scale: Fair                               Pertinent Vitals/Pain Pain Assessment: No/denies pain    Home Living Family/patient expects to be discharged to:: Private residence Living Arrangements: Spouse/significant other Available Help at Discharge: Family;Available 24 hours/day Type of Home: House Home Access: Ramped entrance (1 threshold 4")     Home Layout: One level Home Equipment: Walker - 2 wheels;Walker - 4 wheels;Shower seat      Prior Function Level of Independence: Independent with assistive device(s)         Comments: used RW for mobility, can bath/dress self     Hand Dominance   Dominant Hand: Right    Extremity/Trunk Assessment   Upper Extremity Assessment: Overall WFL for tasks assessed           Lower Extremity Assessment: Generalized weakness         Communication   Communication: No difficulties  Cognition Arousal/Alertness: Awake/alert Behavior During Therapy: WFL for tasks assessed/performed Overall Cognitive Status: Within Functional Limits for tasks assessed  General Comments General comments (skin integrity, edema, etc.): VSS throughout therapy session. Denies chest pain. Mentions pain around left triceps at end of distance.    Exercises        Assessment/Plan    PT Assessment Patient needs continued PT services  PT Diagnosis Abnormality of gait;Generalized weakness   PT  Problem List Decreased strength;Decreased activity tolerance;Decreased balance;Decreased mobility;Cardiopulmonary status limiting activity;Obesity  PT Treatment Interventions DME instruction;Gait training;Functional mobility training;Therapeutic activities;Therapeutic exercise;Balance training;Stair training;Patient/family education   PT Goals (Current goals can be found in the Care Plan section) Acute Rehab PT Goals Patient Stated Goal: Get better here, before I go home PT Goal Formulation: With patient Time For Goal Achievement: 01/16/15 Potential to Achieve Goals: Good    Frequency Min 3X/week   Barriers to discharge        Co-evaluation               End of Session   Activity Tolerance: Patient tolerated treatment well Patient left: in bed;with call bell/phone within reach;with family/visitor present Nurse Communication: Mobility status         Time: 5364-6803 PT Time Calculation (min) (ACUTE ONLY): 22 min   Charges:   PT Evaluation $Initial PT Evaluation Tier I: 1 Procedure     PT G CodesEllouise Newer 01/02/2015, 1:32 PM Elayne Snare, [place]

## 2015-01-02 NOTE — Progress Notes (Signed)
Utilization review completed.  

## 2015-01-03 LAB — CBC
HCT: 37.8 % — ABNORMAL LOW (ref 39.0–52.0)
Hemoglobin: 12 g/dL — ABNORMAL LOW (ref 13.0–17.0)
MCH: 24.3 pg — AB (ref 26.0–34.0)
MCHC: 31.7 g/dL (ref 30.0–36.0)
MCV: 76.7 fL — ABNORMAL LOW (ref 78.0–100.0)
Platelets: 223 10*3/uL (ref 150–400)
RBC: 4.93 MIL/uL (ref 4.22–5.81)
RDW: 18.1 % — AB (ref 11.5–15.5)
WBC: 9.2 10*3/uL (ref 4.0–10.5)

## 2015-01-03 LAB — GLUCOSE, CAPILLARY
GLUCOSE-CAPILLARY: 130 mg/dL — AB (ref 65–99)
Glucose-Capillary: 130 mg/dL — ABNORMAL HIGH (ref 65–99)

## 2015-01-03 LAB — HEPARIN LEVEL (UNFRACTIONATED): HEPARIN UNFRACTIONATED: 0.56 [IU]/mL (ref 0.30–0.70)

## 2015-01-03 LAB — HEMOGLOBIN A1C
HEMOGLOBIN A1C: 7.1 % — AB (ref 4.8–5.6)
Mean Plasma Glucose: 157 mg/dL

## 2015-01-03 MED ORDER — RIVAROXABAN (XARELTO) VTE STARTER PACK (15 & 20 MG)
ORAL_TABLET | ORAL | Status: DC
Start: 2015-01-03 — End: 2015-02-18

## 2015-01-03 MED ORDER — RIVAROXABAN 15 MG PO TABS
15.0000 mg | ORAL_TABLET | Freq: Two times a day (BID) | ORAL | Status: DC
  Administered 2015-01-03: 15 mg via ORAL
  Filled 2015-01-03 (×3): qty 1

## 2015-01-03 MED ORDER — RIVAROXABAN 20 MG PO TABS: 20.0000 mg | ORAL_TABLET | Freq: Every day | ORAL | Status: DC

## 2015-01-03 MED ORDER — RIVAROXABAN 15 MG PO TABS: 15.0000 mg | ORAL_TABLET | Freq: Two times a day (BID) | ORAL | Status: DC

## 2015-01-03 NOTE — Care Management Note (Addendum)
Case Management Note  Patient Details  Name: UNIQUE SEARFOSS MRN: [id number] Date of Birth: 1950-12-23  Subjective/Objective:  Pt admitted with acute pulmonary embolism             Action/Plan:  Pt is independent from home with wife.  Pt goes to the [facility] at [facility] with PCP Dr Sim Boast.  Pt will discharge home on Xeralto, CM will assist with medication set up post discharge and will continue to monitor for disposition needs.   Expected Discharge Date:                  Expected Discharge Plan:  Home/Self Care  In-House Referral:     Discharge planning Services  CM Consult, Medication Assistance  Post Acute Care Choice:    Choice offered to:  Patient (Outpt PT)  DME Arranged:    DME Agency:     HH Arranged:    [place] Agency:     Status of Service: Complete,will sign off   Medicare Important Message Given:    Date Medicare IM Given:    Medicare IM give by:    Date Additional Medicare IM Given:    Additional Medicare Important Message give by:     If discussed at [place] of Stay Meetings, dates discussed:    Additional Comments: CM assessed pt.  CM provided outpt PT choice, pt chose clinic on [address], CM sent referral via epic to clinic. CM contacted Lauren with [facility], notified of admit and requested <30 day MD follow up due to discharging on Xeralto, intake resource acknowledged request.  CM provided free 30 day card to pt and instructed pt to provide card with non refillable prescription to pharmacy of choice.  Pt stated he would use [facility] [address] in [place], [place] contacted pharmacy and was informed medication is available for pick up.  CM submitted benefit check for pt, pt informed CM that he will get his prescription filled at the [facility] post the initial 30 day period for no cost, intake resource verified.  Wife will provide 24 hour supervision post discharge. Maryclare Labrador, RN 01/03/2015, 10:48 AM

## 2015-01-03 NOTE — Discharge Summary (Signed)
Physician Discharge Summary  SHIELDS PAUTZ RKY:[id number] DOB: [birth year] DOA: 12/31/2014  PCP: No primary care provider on file.  Admit date: 12/31/2014 Discharge date: 01/03/2015  Time spent: 20 minutes  Recommendations for Outpatient Follow-up:  1. Follow up with PCP in 1-2 weeks  Discharge Diagnoses:  Principal Problem:   Acute pulmonary embolism Active Problems:   DM type 2 (diabetes mellitus, type 2)   Gout   History of lung cancer   Discharge Condition: Stable  Diet recommendation: Diabetic  Filed Weights   01/01/15 0100  Weight: 110 kg (242 lb 8.1 oz)    History of present illness:  Please review h and p from 8/6 for details. Briefly, pt presented with chest pains, found to have acute pulmonary embolism. The patient was admitted for further work up.  Hospital Course:  #1 acute pulmonary embolism: Troponin mildly elevated, likely secondary to acute PE. Echocardiogram was ordered, findings of grade 2 diastolic dysfunction and no wall motion abnormality. Lower extremity venous Dopplers neg for DVT. Will likely need life-long anticoagulation given cancer hx  #2 History of diabetes mellitus type 2: Metformin was on hold since he got IV contrast. Sliding scale coverage. HbA1c 7.1  #3 history of lung cancer: Status post surgery 5 years ago. No chemotherapy in the last 3 years. Follow-up with his providers at the [place].  #4 history of chronic back pain and neck pain: Continue with gabapentin. Pain medications as needed. PT and OT to evaluate. No neurological deficits noted.  Procedures: 2d echo LE dopplers   Discharge Exam: Filed Vitals:   01/02/15 1953 01/02/15 2254 01/03/15 0359 01/03/15 1313  BP: 127/72 141/82 133/75 121/74  Pulse: 78 80 66 76  Temp: 98.8 F (37.1 C)  98.3 F (36.8 C) 98.3 F (36.8 C)  TempSrc: Oral  Oral Oral  Resp: 20   19  Height:      Weight:      SpO2: 98% 100% 94% 98%    General: Awake, in nad Cardiovascular: regular, s1,  s2 Respiratory: normal resp effort, no wheezing  Discharge Instructions     Medication List    TAKE these medications        allopurinol 300 MG tablet  Commonly known as:  ZYLOPRIM  Take 300 mg by mouth daily.     aspirin EC 81 MG tablet  Take 81 mg by mouth daily.     ERGOCALCIFEROL PO  Take 1 tablet by mouth daily.     fenofibrate 145 MG tablet  Commonly known as:  TRICOR  Take 145 mg by mouth daily.     gabapentin 600 MG tablet  Commonly known as:  NEURONTIN  Take 600-900 mg by mouth 2 (two) times daily. Takes 2 caps in am and 3 caps at bedtime     HYDROcodone-acetaminophen 10-325 MG per tablet  Commonly known as:  NORCO  Take 1 tablet by mouth every 6 (six) hours as needed for moderate pain.     magnesium oxide 400 MG tablet  Commonly known as:  MAG-OX  Take 400 mg by mouth 2 (two) times daily.     meclizine 25 MG tablet  Commonly known as:  ANTIVERT  Take 25 mg by mouth daily as needed for dizziness.     metFORMIN 500 MG tablet  Commonly known as:  GLUCOPHAGE  Take 500 mg by mouth daily before breakfast.     omeprazole 20 MG capsule  Commonly known as:  PRILOSEC  Take 20 mg by mouth  daily.     Rivaroxaban 15 & 20 MG Tbpk  Commonly known as:  XARELTO STARTER PACK  Take as directed on package: Start with one '15mg'$  tablet by mouth twice a day with food. On Day 22, switch to one '20mg'$  tablet once a day with food.     sertraline 100 MG tablet  Commonly known as:  ZOLOFT  Take 100 mg by mouth daily.     Vitamin B-12 CR 1000 MCG Tbcr  Take 1,000 mg by mouth daily.       Allergies  Allergen Reactions  . Epinephrine Anaphylaxis  . Sulfa Antibiotics Other (See Comments)    unknown  . Hydrocodone Itching and Rash    High strength dosing causes reaction  . Morphine Other (See Comments)    unknown       Follow-up Information    Follow up with [facility]   Specialty:  Rehabilitation   Why:  Please contact for inital  appointment (Physical therapy)   Contact information:   [address] [id number] [place]      Follow up with [place].   Why:  Phone: [phone] if you wish to establish care there      Follow up with Please follow up with your PCP in 1-2 weeks.       The results of significant diagnostics from this hospitalization (including imaging, microbiology, ancillary and laboratory) are listed below for reference.    Significant Diagnostic Studies: Dg Chest 2 View  12/31/2014   CLINICAL DATA:  Shortness of breath for several months on off after activity including walking, worse recently, past history lung cancer, diabetes mellitus  EXAM: CHEST  2 VIEW  COMPARISON:  04/08/2009  FINDINGS: Upper normal heart size.  Mediastinal contours and pulmonary vascularity normal.  LEFT basilar atelectasis.  Remaining lungs clear.  No pleural effusion or pneumothorax.  IMPRESSION: LEFT basilar atelectasis.  Previous identified LEFT perihilar mass/tumor no longer identified.   Electronically Signed   By: Lavonia Dana M.D.   On: 12/31/2014 16:30   Ct Angio Chest Pe W/cm &/or Wo Cm  12/31/2014   CLINICAL DATA:  Right chest pain and mid chest pressure radiating into the back and down the right torso. Known abdominal aortic aneurysm.  EXAM: CT ANGIOGRAPHY CHEST  CT ABDOMEN AND PELVIS WITH CONTRAST  TECHNIQUE: Multidetector CT imaging of the chest was performed using the standard protocol during bolus administration of intravenous contrast. Multiplanar CT image reconstructions and MIPs were obtained to evaluate the vascular anatomy. Multidetector CT imaging of the abdomen and pelvis was performed using the standard protocol during bolus administration of intravenous contrast.  CONTRAST:  146m OMNIPAQUE IOHEXOL 350 MG/ML SOLN  COMPARISON:  04/08/2009  FINDINGS: CTA CHEST FINDINGS  Cardiovascular: There is a large volume pulmonary embolus within the right  pulmonary arterial system, involving upper lobe, middle lobe and lower lobe. No left-sided pulmonary emboli are evident. There is CT evidence of right heart strain. The RV:LV ratio is 1.1.  Lungs: There is prior left lower lobectomy. There is mild curvilinear scarring in the left base. No confluent airspace consolidation.  Central airways: Patent  Effusions: None  Lymphadenopathy: None  Esophagus: Unremarkable  Musculoskeletal: No significant abnormality.  CT ABDOMEN and PELVIS FINDINGS  Hepatobiliary: There is prior cholecystectomy. The liver and bile ducts appear normal.  Pancreas: Normal  Spleen: Normal  Adrenals/Urinary Tract: The adrenals and kidneys are normal in appearance. There is no urinary  calculus evident. There is no hydronephrosis or ureteral dilatation. Collecting systems and ureters appear unremarkable. There is a diverticulum at the right posterolateral aspect of the urinary bladder just anterior to the ureterovesical junction.  Stomach/Bowel: There are normal appearances of the stomach, small bowel and colon. The appendix is normal.  Vascular/Lymphatic: There is a 3.2 cm infrarenal abdominal aortic aneurysm without dissection or leak. There is mild atherosclerotic calcification.  Reproductive: Unremarkable  Other: No acute inflammatory changes are evident in the abdomen or pelvis. There is no adenopathy. There is no ascites.  Musculoskeletal: There is posterior decompression of the lumbar spine from L3 through the sacrum with instrumented fusion from L4 through the sacrum. There is a fat containing left inguinal hernia  Review of the MIP images confirms the above findings.  IMPRESSION: 1. Large volume pulmonary embolism to the right pulmonary arterial system, with CT evidence of right heart strain. These results were called by telephone at the time of interpretation on 12/31/2014 at 11:20 pm to Dr. Theotis Burrow , who verbally acknowledged these results. Positive for acute PE with CT evidence of  right heart strain (RV/LV Ratio = 1.1) consistent with at least submassive (intermediate risk) PE. The presence of right heart strain has been associated with an increased risk of morbidity and mortality. Please activate Code PE by paging [phone]. 2. Left lower lobectomy. No evidence of active neoplastic disease in the chest. 3. 3.2 cm infrarenal abdominal aortic aneurysm 4. Right-sided urinary bladder Hutch diverticulum 5. Fat containing left inguinal hernia.   Electronically Signed   By: Andreas Newport M.D.   On: 12/31/2014 23:25   Ct Abdomen Pelvis W Contrast  12/31/2014   CLINICAL DATA:  Right chest pain and mid chest pressure radiating into the back and down the right torso. Known abdominal aortic aneurysm.  EXAM: CT ANGIOGRAPHY CHEST  CT ABDOMEN AND PELVIS WITH CONTRAST  TECHNIQUE: Multidetector CT imaging of the chest was performed using the standard protocol during bolus administration of intravenous contrast. Multiplanar CT image reconstructions and MIPs were obtained to evaluate the vascular anatomy. Multidetector CT imaging of the abdomen and pelvis was performed using the standard protocol during bolus administration of intravenous contrast.  CONTRAST:  171m OMNIPAQUE IOHEXOL 350 MG/ML SOLN  COMPARISON:  04/08/2009  FINDINGS: CTA CHEST FINDINGS  Cardiovascular: There is a large volume pulmonary embolus within the right pulmonary arterial system, involving upper lobe, middle lobe and lower lobe. No left-sided pulmonary emboli are evident. There is CT evidence of right heart strain. The RV:LV ratio is 1.1.  Lungs: There is prior left lower lobectomy. There is mild curvilinear scarring in the left base. No confluent airspace consolidation.  Central airways: Patent  Effusions: None  Lymphadenopathy: None  Esophagus: Unremarkable  Musculoskeletal: No significant abnormality.  CT ABDOMEN and PELVIS FINDINGS  Hepatobiliary: There is prior cholecystectomy. The liver and bile ducts appear normal.   Pancreas: Normal  Spleen: Normal  Adrenals/Urinary Tract: The adrenals and kidneys are normal in appearance. There is no urinary calculus evident. There is no hydronephrosis or ureteral dilatation. Collecting systems and ureters appear unremarkable. There is a diverticulum at the right posterolateral aspect of the urinary bladder just anterior to the ureterovesical junction.  Stomach/Bowel: There are normal appearances of the stomach, small bowel and colon. The appendix is normal.  Vascular/Lymphatic: There is a 3.2 cm infrarenal abdominal aortic aneurysm without dissection or leak. There is mild atherosclerotic calcification.  Reproductive: Unremarkable  Other: No acute inflammatory changes are evident in  the abdomen or pelvis. There is no adenopathy. There is no ascites.  Musculoskeletal: There is posterior decompression of the lumbar spine from L3 through the sacrum with instrumented fusion from L4 through the sacrum. There is a fat containing left inguinal hernia  Review of the MIP images confirms the above findings.  IMPRESSION: 1. Large volume pulmonary embolism to the right pulmonary arterial system, with CT evidence of right heart strain. These results were called by telephone at the time of interpretation on 12/31/2014 at 11:20 pm to Dr. Theotis Burrow , who verbally acknowledged these results. Positive for acute PE with CT evidence of right heart strain (RV/LV Ratio = 1.1) consistent with at least submassive (intermediate risk) PE. The presence of right heart strain has been associated with an increased risk of morbidity and mortality. Please activate Code PE by paging [phone]. 2. Left lower lobectomy. No evidence of active neoplastic disease in the chest. 3. 3.2 cm infrarenal abdominal aortic aneurysm 4. Right-sided urinary bladder Hutch diverticulum 5. Fat containing left inguinal hernia.   Electronically Signed   By: Andreas Newport M.D.   On: 12/31/2014 23:25    Microbiology: Recent Results  (from the past 240 hour(s))  MRSA PCR Screening     Status: None   Collection Time: 01/01/15  1:19 AM  Result Value Ref Range Status   MRSA by PCR NEGATIVE NEGATIVE Final    Comment:        The GeneXpert MRSA Assay (FDA approved for NASAL specimens only), is one component of a comprehensive MRSA colonization surveillance program. It is not intended to diagnose MRSA infection nor to guide or monitor treatment for MRSA infections.      Labs: Basic Metabolic Panel:  Recent Labs Lab 12/31/14 1602 01/01/15 0235  NA 135 137  K 3.8 4.0  CL 103 106  CO2 21* 20*  GLUCOSE 224* 179*  BUN 18 16  CREATININE 1.42* 1.38*  CALCIUM 9.3 8.6*   Liver Function Tests:  Recent Labs Lab 01/01/15 0235  AST 23  ALT 14*  ALKPHOS 78  BILITOT 0.5  PROT 6.1*  ALBUMIN 3.4*   No results for input(s): LIPASE, AMYLASE in the last 168 hours. No results for input(s): AMMONIA in the last 168 hours. CBC:  Recent Labs Lab 12/31/14 1602 01/01/15 0002 01/02/15 0400 01/03/15 0400  WBC 10.8* 9.2 8.6 9.2  HGB 12.0* 11.1* 10.8* 12.0*  HCT 37.7* 34.6* 34.9* 37.8*  MCV 77.1* 75.7* 76.4* 76.7*  PLT 257 214 241 223   Cardiac Enzymes:  Recent Labs Lab 12/31/14 2205 01/01/15 0235 01/01/15 1030 01/01/15 1430  CKTOTAL 73  --   --   --   TROPONINI 0.05* 0.05* 0.04* 0.04*   BNP: BNP (last 3 results)  Recent Labs  12/31/14 1602  BNP 60.5    ProBNP (last 3 results) No results for input(s): PROBNP in the last 8760 hours.  CBG:  Recent Labs Lab 01/02/15 1142 01/02/15 1633 01/02/15 2205 01/03/15 0628 01/03/15 1124  GLUCAP 205* 133* 136* 130* 130*    Signed:  CHIU, STEPHEN K  [facility] 01/03/2015, 1:53 PM

## 2015-01-03 NOTE — Progress Notes (Signed)
Discussed with the patient and all questioned fully answered. He will call me if any problems arise. Discharge teaching included Xarelto & anticoagulation, when to call the MD, future appointments and medications. Patient discharged at 3:10 PM.  Fritz Pickerel RN

## 2015-01-03 NOTE — Evaluation (Addendum)
Occupational Therapy Evaluation Patient Details Name: Harold Morris MRN: [id number] DOB: 1951/01/17 Today's Date: 01/03/2015    History of Present Illness 64 y.o. admitted with acute pulmonary embolism with right heart strain. Hx of DMII, lung CA, and back pain.   Clinical Impression   Pt admitted with above. Pt independent with ADLs, PTA. Feel pt will benefit from acute OT to increase independence prior to d/c. Plan to practice tub transfer next session.    Follow Up Recommendations  No OT follow up;Supervision/Assistance - 24 hour    Equipment Recommendations  None recommended by OT    Recommendations for Other Services       Precautions / Restrictions Precautions Precautions: Fall Precaution Comments: monitor O2 Restrictions Weight Bearing Restrictions: No      Mobility Bed Mobility Overal bed mobility: Modified Independent                Transfers Overall transfer level: Needs assistance   Transfers: Sit to/from Stand Sit to Stand: Min guard         General transfer comment: pt reports lightheadedness upon standing    Balance    Min guard for ambulation with and without RW. Pt appeared to have leg buckling during ambulation 2x.                                        ADL Overall ADL's : Needs assistance/impaired                     Lower Body Dressing: Min guard;Sit to/from stand   Toilet Transfer: Min guard;Ambulation;RW (also ambulated without RW)           Functional mobility during ADLs: Min guard;Rolling walker (with and without RW) General ADL Comments: Educated on safety such as safe footwear, use of bag/basket on walker, sitting for LB ADLs, and rugs on floor. Recommended pt use walker as it appeared he had leg buckling twice with ambulation without walker.      Vision  Pt wears glasses.   Perception     Praxis      Pertinent Vitals/Pain Pain Assessment: 0-10 Pain Score: 2  Pain Location: back ,  left leg, right lower abdomen area, and shoulders with resistance when testing strength Pain Descriptors / Indicators: Other (Comment) (pinching); sore? in left leg     Hand Dominance Right   Extremity/Trunk Assessment Upper Extremity Assessment Upper Extremity Assessment: Generalized weakness (pain in shoulders when OT applied resitance to test strength with shoulder flexion)    Lower Extremity Assessment Lower Extremity Assessment: Defer to PT evaluation       Communication Communication Communication: No difficulties   Cognition Arousal/Alertness: Awake/alert Behavior During Therapy: WFL for tasks assessed/performed Overall Cognitive Status: Within Functional Limits for tasks assessed                     General Comments       Exercises       Shoulder Instructions      Home Living Family/patient expects to be discharged to:: Private residence Living Arrangements: Spouse/significant other Available Help at Discharge: Family;Available 24 hours/day Type of Home: House Home Access: Ramped entrance (1 threshold 4 inches)     Home Layout: One level     Bathroom Shower/Tub: Teacher, early years/pre: Handicapped height     Home Equipment: Environmental consultant - 2  wheels;Walker - 4 wheels;Shower seat;Bedside commode (unsure if pt has shower seat or tub bench)          Prior Functioning/Environment Level of Independence: Independent with assistive device(s)        Comments: used RW for mobility, can bath/dress self    OT Diagnosis: Generalized weakness;Acute pain   OT Problem List: Impaired balance (sitting and/or standing);Decreased knowledge of precautions;Decreased knowledge of use of DME or AE;Pain;Decreased strength;Decreased safety awareness;Decreased activity tolerance   OT Treatment/Interventions: Self-care/ADL training;DME and/or AE instruction;Therapeutic activities;Patient/family education;Balance training;Therapeutic exercise    OT  Goals(Current goals can be found in the care plan section) Acute Rehab OT Goals Patient Stated Goal: go home OT Goal Formulation: With patient Time For Goal Achievement: 01/10/15 Potential to Achieve Goals: Good ADL Goals Pt Will Perform Lower Body Bathing: with set-up;sit to/from stand Pt Will Perform Lower Body Dressing: with set-up;sit to/from stand Pt Will Transfer to Toilet: ambulating;with modified independence Pt Will Perform Tub/Shower Transfer: Tub transfer;with supervision;ambulating;rolling walker;shower seat  OT Frequency: Min 2X/week   Barriers to D/C:            Co-evaluation              End of Session Equipment Utilized During Treatment: Gait belt;Rolling walker Nurse Communication: Mobility status;Other (comment) (bed alarm set; legs buckled; called tech to see about getting pt urinal; pain in left leg)  Activity Tolerance: Patient tolerated treatment well Patient left: in bed;with call bell/phone within reach;with bed alarm set   Time: [phone] (pt on phone for a little while) OT Time Calculation (min): 21 min Charges:  OT General Charges $OT Visit: 1 Procedure OT Evaluation $Initial OT Evaluation Tier I: 1 Procedure G-CodesBenito Mccreedy OTR/L [phone] 01/03/2015, 10:40 AM

## 2015-01-03 NOTE — Care Management Important Message (Signed)
Important Message  Patient Details  Name: Harold Morris MRN: [id number] Date of Birth: Jan 28, 1951   Medicare Important Message Given:  Yes-second notification given    Pricilla Handler 01/03/2015, 2:35 PM

## 2015-01-03 NOTE — Discharge Instructions (Signed)
Information on my medicine - XARELTO (rivaroxaban)  This medication education was reviewed with me or my healthcare representative as part of my discharge preparation.    WHY WAS XARELTO PRESCRIBED FOR YOU? Xarelto was prescribed to treat blood clots that may have been found in the veins of your legs (deep vein thrombosis) or in your lungs (pulmonary embolism) and to reduce the risk of them occurring again.  What do you need to know about Xarelto? The starting dose is one 15 mg tablet taken TWICE daily with food for the FIRST 21 DAYS then on 01/24/15  the dose is changed to one 20 mg tablet taken ONCE A DAY with your evening meal.  DO NOT stop taking Xarelto without talking to the health care provider who prescribed the medication.  Refill your prescription for 20 mg tablets before you run out.  After discharge, you should have regular check-up appointments with your healthcare provider that is prescribing your Xarelto.  In the future your dose may need to be changed if your kidney function changes by a significant amount.  What do you do if you miss a dose? If you are taking Xarelto TWICE DAILY and you miss a dose, take it as soon as you remember. You may take two 15 mg tablets (total 30 mg) at the same time then resume your regularly scheduled 15 mg twice daily the next day.  If you are taking Xarelto ONCE DAILY and you miss a dose, take it as soon as you remember on the same day then continue your regularly scheduled once daily regimen the next day. Do not take two doses of Xarelto at the same time.   Important Safety Information Xarelto is a blood thinner medicine that can cause bleeding. You should call your healthcare provider right away if you experience any of the following: ? Bleeding from an injury or your nose that does not stop. ? Unusual colored urine (red or dark Tenny) or unusual colored stools (red or black). ? Unusual bruising for unknown reasons. ? A serious fall or  if you hit your head (even if there is no bleeding).  Some medicines may interact with Xarelto and might increase your risk of bleeding while on Xarelto. To help avoid this, consult your healthcare provider or pharmacist prior to using any new prescription or non-prescription medications, including herbals, vitamins, non-steroidal anti-inflammatory drugs (NSAIDs) and supplements.  This website has more information on Xarelto: [email or web address]

## 2015-01-03 NOTE — Progress Notes (Signed)
ANTICOAGULATION CONSULT NOTE - Follow Up Consult  Pharmacy Consult for heparin Indication: PE  Allergies  Allergen Reactions  . Epinephrine Anaphylaxis  . Sulfa Antibiotics Other (See Comments)    unknown  . Hydrocodone Itching and Rash    High strength dosing causes reaction  . Morphine Other (See Comments)    unknown    Patient Measurements: Height: '5\' 9"'$  (175.3 cm) Weight: 242 lb 8.1 oz (110 kg) IBW/kg (Calculated) : 70.7 Heparin Dosing Weight: 95 kg  Vital Signs: Temp: 98.3 F (36.8 C) (08/08 0359) Temp Source: Oral (08/08 0359) BP: 133/75 mmHg (08/08 0359) Pulse Rate: 66 (08/08 0359)  Labs:  Recent Labs  12/31/14 1602  12/31/14 2205 01/01/15 0002 01/01/15 0235 01/01/15 1030 01/01/15 1430  01/02/15 0400 01/02/15 1316 01/02/15 2037 01/03/15 0400  HGB 12.0*  --   --  11.1*  --   --   --   --  10.8*  --   --  12.0*  HCT 37.7*  --   --  34.6*  --   --   --   --  34.9*  --   --  37.8*  PLT 257  --   --  214  --   --   --   --  241  --   --  223  HEPARINUNFRC  --   --   --   --   --  0.21*  --   < > 0.32 0.44 0.48 0.56  CREATININE 1.42*  --   --   --  1.38*  --   --   --   --   --   --   --   CKTOTAL  --   --  73  --   --   --   --   --   --   --   --   --   TROPONINI  --   < > 0.05*  --  0.05* 0.04* 0.04*  --   --   --   --   --   < > = values in this interval not displayed.  Estimated Creatinine Clearance: 64.3 mL/min (by C-G formula based on Cr of 1.38).   Assessment: 64 y/o M with h/o lung CA s/p resection, found to have large right-sided PE on CT Angio  PMH: lung CA s/p resectin, T2DM, HLD, gout, AAA  Anticoagulation: large right-sided PE w/right heart strain, started on heparin gtt at 1750 units/hr. HL this AM was 0.56 (no therapeutic x 5 levels)   Hematology / Oncology: H&H 12/37.8, plt 223  Goal of Therapy:  Heparin level 0.3-0.7 units/ml Monitor platelets by anticoagulation protocol: Yes   Plan:  Continue heparin on 1750 units/hr  Daily HL,  CBC Monitor for s/sx of bleeding  Levester Fresh, PharmD, BCPS Clinical Pharmacist Pager [phone] 01/03/2015 8:09 AM

## 2015-01-04 NOTE — Progress Notes (Signed)
Follow-up:  Received a call from RN on 2W stating pt has returned to floor tonight because he did not receive a Rx for his Xarelto at the time of d/c. Called the Rx at [facility] on [address] [facility] and ordered pt's Xarelto per d/c instruction (Starter kit > 15 mg PO w/ meals BID w/ meals x 21 days then 20 mg PO QD w/ meals starting on day 22).   Jeryl Columbia, NP-C [facility] Pager [phone]

## 2015-01-09 ENCOUNTER — Emergency Department (HOSPITAL_COMMUNITY): Payer: Medicare Other

## 2015-01-09 ENCOUNTER — Emergency Department (HOSPITAL_COMMUNITY)
Admission: EM | Admit: 2015-01-09 | Discharge: 2015-01-09 | Disposition: A | Discharge status: Home / Self Care | Payer: Medicare Other | Attending: Emergency Medicine | Admitting: Emergency Medicine

## 2015-01-09 ENCOUNTER — Encounter (HOSPITAL_COMMUNITY): Payer: Self-pay | Admitting: Emergency Medicine

## 2015-01-09 DIAGNOSIS — Z87891 Personal history of nicotine dependence: Secondary | ICD-10-CM | POA: Insufficient documentation

## 2015-01-09 DIAGNOSIS — R1013 Epigastric pain: Secondary | ICD-10-CM | POA: Insufficient documentation

## 2015-01-09 DIAGNOSIS — R079 Chest pain, unspecified: Secondary | ICD-10-CM | POA: Diagnosis not present

## 2015-01-09 DIAGNOSIS — E119 Type 2 diabetes mellitus without complications: Secondary | ICD-10-CM | POA: Diagnosis not present

## 2015-01-09 DIAGNOSIS — R002 Palpitations: Secondary | ICD-10-CM | POA: Diagnosis not present

## 2015-01-09 DIAGNOSIS — Z85118 Personal history of other malignant neoplasm of bronchus and lung: Secondary | ICD-10-CM | POA: Insufficient documentation

## 2015-01-09 DIAGNOSIS — G8929 Other chronic pain: Secondary | ICD-10-CM | POA: Diagnosis not present

## 2015-01-09 DIAGNOSIS — Z86711 Personal history of pulmonary embolism: Secondary | ICD-10-CM | POA: Diagnosis not present

## 2015-01-09 DIAGNOSIS — Z7982 Long term (current) use of aspirin: Secondary | ICD-10-CM | POA: Insufficient documentation

## 2015-01-09 DIAGNOSIS — E780 Pure hypercholesterolemia: Secondary | ICD-10-CM | POA: Insufficient documentation

## 2015-01-09 DIAGNOSIS — M109 Gout, unspecified: Secondary | ICD-10-CM | POA: Insufficient documentation

## 2015-01-09 DIAGNOSIS — Z79899 Other long term (current) drug therapy: Secondary | ICD-10-CM | POA: Insufficient documentation

## 2015-01-09 DIAGNOSIS — M542 Cervicalgia: Secondary | ICD-10-CM | POA: Diagnosis not present

## 2015-01-09 LAB — BASIC METABOLIC PANEL
Anion gap: 12 (ref 5–15)
BUN: 19 mg/dL (ref 6–20)
CO2: 23 mmol/L (ref 22–32)
CREATININE: 1.28 mg/dL — AB (ref 0.61–1.24)
Calcium: 9.5 mg/dL (ref 8.9–10.3)
Chloride: 101 mmol/L (ref 101–111)
GFR calc Af Amer: >60 mL/min (ref >60)
GFR, EST NON AFRICAN AMERICAN: 57 mL/min — AB (ref >60)
Glucose, Bld: 193 mg/dL — ABNORMAL HIGH (ref 65–99)
Potassium: 3.8 mmol/L (ref 3.5–5.1)
Sodium: 136 mmol/L (ref 135–145)

## 2015-01-09 LAB — URINALYSIS, ROUTINE W REFLEX MICROSCOPIC
Bilirubin Urine: NEGATIVE
Glucose, UA: NEGATIVE mg/dL
HGB URINE DIPSTICK: NEGATIVE
KETONES UR: NEGATIVE mg/dL
Leukocytes, UA: NEGATIVE
NITRITE: NEGATIVE
PROTEIN: NEGATIVE mg/dL
SPECIFIC GRAVITY, URINE: 1.022 (ref 1.005–1.030)
UROBILINOGEN UA: 0.2 mg/dL (ref 0.0–1.0)
pH: 5 (ref 5.0–8.0)

## 2015-01-09 LAB — I-STAT TROPONIN, ED: Troponin i, poc: 0 ng/mL (ref 0.00–0.08)

## 2015-01-09 LAB — PROTIME-INR
INR: 1.59 — ABNORMAL HIGH (ref 0.00–1.49)
Prothrombin Time: 19 seconds — ABNORMAL HIGH (ref 11.6–15.2)

## 2015-01-09 LAB — CBC
HCT: 36.2 % — ABNORMAL LOW (ref 39.0–52.0)
HEMOGLOBIN: 11.5 g/dL — AB (ref 13.0–17.0)
MCH: 24.5 pg — AB (ref 26.0–34.0)
MCHC: 31.8 g/dL (ref 30.0–36.0)
MCV: 77.2 fL — ABNORMAL LOW (ref 78.0–100.0)
Platelets: 332 10*3/uL (ref 150–400)
RBC: 4.69 MIL/uL (ref 4.22–5.81)
RDW: 17.9 % — ABNORMAL HIGH (ref 11.5–15.5)
WBC: 11.1 10*3/uL — ABNORMAL HIGH (ref 4.0–10.5)

## 2015-01-09 MED ORDER — IOHEXOL 300 MG/ML  SOLN
25.0000 mL | Freq: Once | INTRAMUSCULAR | Status: AC | PRN
  Administered 2015-01-09: 25 mL via ORAL

## 2015-01-09 MED ORDER — METHOCARBAMOL 500 MG PO TABS: 500.0000 mg | ORAL_TABLET | Freq: Two times a day (BID) | ORAL | Status: DC | PRN

## 2015-01-09 MED ORDER — SODIUM CHLORIDE 0.9 % IV BOLUS (SEPSIS)
1000.0000 mL | Freq: Once | INTRAVENOUS | Status: AC
  Administered 2015-01-09: 1000 mL via INTRAVENOUS

## 2015-01-09 MED ORDER — HYDROMORPHONE HCL 1 MG/ML IJ SOLN
1.0000 mg | Freq: Once | INTRAMUSCULAR | Status: AC
  Administered 2015-01-09: 1 mg via INTRAMUSCULAR
  Filled 2015-01-09: qty 1

## 2015-01-09 MED ORDER — IOHEXOL 300 MG/ML  SOLN
100.0000 mL | Freq: Once | INTRAMUSCULAR | Status: AC | PRN
  Administered 2015-01-09: 100 mL via INTRAVENOUS

## 2015-01-09 MED ORDER — METHOCARBAMOL 500 MG PO TABS
500.0000 mg | ORAL_TABLET | Freq: Once | ORAL | Status: AC
  Administered 2015-01-09: 500 mg via ORAL
  Filled 2015-01-09: qty 1

## 2015-01-09 MED ORDER — HYDROMORPHONE HCL 1 MG/ML IJ SOLN: 0.5000 mg | Freq: Once | INTRAMUSCULAR | Status: DC

## 2015-01-09 MED ORDER — HYDROMORPHONE HCL 1 MG/ML IJ SOLN
0.5000 mg | Freq: Once | INTRAMUSCULAR | Status: AC
  Administered 2015-01-09: 0.5 mg via INTRAVENOUS
  Filled 2015-01-09: qty 1

## 2015-01-09 NOTE — ED Notes (Signed)
PA at the bedside.

## 2015-01-09 NOTE — ED Notes (Signed)
Patient here with complaint of epigastric pain. States onset yesterday. Reports fluttering feeling in his chest. Also reports RLQ abd pain and neck pain.

## 2015-01-09 NOTE — ED Notes (Signed)
Patient is getting dressed and using the urinal.

## 2015-01-09 NOTE — ED Notes (Signed)
Pt talking in a very sarcastic and rude way. Pt upset about waiting time on the ED.

## 2015-01-09 NOTE — ED Notes (Signed)
Patient returned from CT

## 2015-01-09 NOTE — Discharge Instructions (Signed)
Neck stiffness  CAUSES  Acute torticollis may be caused by malposition, trauma or infection. Most commonly, acute torticollis is caused by sleeping in an awkward position. Torticollis may also be caused by the flexion, extension or twisting of the neck muscles beyond their normal position. Sometimes, the exact cause may not be known. SYMPTOMS  Usually, there is pain and limited movement of the neck. Your neck may twist to one side. DIAGNOSIS  The diagnosis is often made by physical examination. X-rays, CT scans or MRIs may be done if there is a history of trauma or concern of infection. TREATMENT  For a common, stiff neck that develops during sleep, treatment is focused on relaxing the contracted neck muscle. Medications (including shots) may be used to treat the problem. Most cases resolve in several days. Torticollis usually responds to conservative physical therapy. If left untreated, the shortened and spastic neck muscle can cause deformities in the face and neck. Rarely, surgery is required. HOME CARE INSTRUCTIONS   Use over-the-counter and prescription medications as directed by your caregiver.  Do stretching exercises and massage the neck as directed by your caregiver.  Follow up with physical therapy if needed and as directed by your caregiver. SEEK IMMEDIATE MEDICAL CARE IF:   You develop difficulty breathing or noisy breathing (stridor).  You drool, develop trouble swallowing or have pain with swallowing.  You develop numbness or weakness in the hands or feet.  You have changes in speech or vision.  You have problems with urination or bowel movements.  You have difficulty walking.  You have a fever.  You have increased pain. MAKE SURE YOU:   Understand these instructions.  Will watch your condition.  Will get help right away if you are not doing well or get worse. Document Released: 05/11/2000 Document Revised: 08/06/2011 Document Reviewed: 06/22/2009 [facility]  Patient Information 2015 [place], [place]. This information is not intended to replace advice given to you by your health care provider. Make sure you discuss any questions you have with your health care provider.  Abdominal Pain Many things can cause abdominal pain. Usually, abdominal pain is not caused by a disease and will improve without treatment. It can often be observed and treated at home. Your health care provider will do a physical exam and possibly order blood tests and X-rays to help determine the seriousness of your pain. However, in many cases, more time must pass before a clear cause of the pain can be found. Before that point, your health care provider may not know if you need more testing or further treatment. HOME CARE INSTRUCTIONS  Monitor your abdominal pain for any changes. The following actions may help to alleviate any discomfort you are experiencing:  Only take over-the-counter or prescription medicines as directed by your health care provider.  Do not take laxatives unless directed to do so by your health care provider.  Try a clear liquid diet (broth, tea, or water) as directed by your health care provider. Slowly move to a bland diet as tolerated. SEEK MEDICAL CARE IF:  You have unexplained abdominal pain.  You have abdominal pain associated with nausea or diarrhea.  You have pain when you urinate or have a bowel movement.  You experience abdominal pain that wakes you in the night.  You have abdominal pain that is worsened or improved by eating food.  You have abdominal pain that is worsened with eating fatty foods.  You have a fever. SEEK IMMEDIATE MEDICAL CARE IF:   Your  pain does not go away within 2 hours.  You keep throwing up (vomiting).  Your pain is felt only in portions of the abdomen, such as the right side or the left lower portion of the abdomen.  You pass bloody or black tarry stools. MAKE SURE YOU:  Understand these instructions.   Will  watch your condition.   Will get help right away if you are not doing well or get worse.  Document Released: 02/21/2005 Document Revised: 05/19/2013 Document Reviewed: 01/21/2013 [facility] Patient Information 2015 [place], [place]. This information is not intended to replace advice given to you by your health care provider. Make sure you discuss any questions you have with your health care provider. Peripheral Neuropathy Peripheral neuropathy is a type of nerve damage. It affects nerves that carry signals between the spinal cord and other parts of the body. These are called peripheral nerves. With peripheral neuropathy, one nerve or a group of nerves may be damaged.  CAUSES  Many things can damage peripheral nerves. For some people with peripheral neuropathy, the cause is unknown. Some causes include:  Diabetes. This is the most common cause of peripheral neuropathy.  Injury to a nerve.  Pressure or stress on a nerve that lasts a long time.  Too little vitamin B. Alcoholism can lead to this.  Infections.  Autoimmune diseases, such as multiple sclerosis and systemic lupus erythematosus.  Inherited nerve diseases.  Some medicines, such as cancer drugs.  Toxic substances, such as lead and mercury.  Too little blood flowing to the legs.  Kidney disease.  Thyroid disease. SIGNS AND SYMPTOMS  Different people have different symptoms. The symptoms you have will depend on which of your nerves is damaged. Common symptoms include:  Loss of feeling (numbness) in the feet and hands.  Tingling in the feet and hands.  Pain that burns.  Very sensitive skin.  Weakness.  Not being able to move a part of the body (paralysis).  Muscle twitching.  Clumsiness or poor coordination.  Loss of balance.  Not being able to control your bladder.  Feeling dizzy.  Sexual problems. DIAGNOSIS  Peripheral neuropathy is a symptom, not a disease. Finding the cause of peripheral neuropathy  can be hard. To figure that out, your health care provider will take a medical history and do a physical exam. A neurological exam will also be done. This involves checking things affected by your brain, spinal cord, and nerves (nervous system). For example, your health care provider will check your reflexes, how you move, and what you can feel.  Other types of tests may also be ordered, such as:  Blood tests.  A test of the fluid in your spinal cord.  Imaging tests, such as CT scans or an MRI.  Electromyography (EMG). This test checks the nerves that control muscles.  Nerve conduction velocity tests. These tests check how fast messages pass through your nerves.  Nerve biopsy. A small piece of nerve is removed. It is then checked under a microscope. TREATMENT   Medicine is often used to treat peripheral neuropathy. Medicines may include:  Pain-relieving medicines. Prescription or over-the-counter medicine may be suggested.  Antiseizure medicine. This may be used for pain.  Antidepressants. These also may help ease pain from neuropathy.  Lidocaine. This is a numbing medicine. You might wear a patch or be given a shot.  Mexiletine. This medicine is typically used to help control irregular heart rhythms.  Surgery. Surgery may be needed to relieve pressure on a nerve  or to destroy a nerve that is causing pain.  Physical therapy to help movement.  Assistive devices to help movement. HOME CARE INSTRUCTIONS   Only take over-the-counter or prescription medicines as directed by your health care provider. Follow the instructions carefully for any given medicines. Do not take any other medicines without first getting approval from your health care provider.  If you have diabetes, work closely with your health care provider to keep your blood sugar under control.  If you have numbness in your feet:  Check every day for signs of injury or infection. Watch for redness, warmth, and  swelling.  Wear padded socks and comfortable shoes. These help protect your feet.  Do not do things that put pressure on your damaged nerve.  Do not smoke. Smoking keeps blood from getting to damaged nerves.  Avoid or limit alcohol. Too much alcohol can cause a lack of B vitamins. These vitamins are needed for healthy nerves.  Develop a good support system. Coping with peripheral neuropathy can be stressful. Talk to a mental health specialist or join a support group if you are struggling.  Follow up with your health care provider as directed. SEEK MEDICAL CARE IF:   You have new signs or symptoms of peripheral neuropathy.  You are struggling emotionally from dealing with peripheral neuropathy.  You have a fever. SEEK IMMEDIATE MEDICAL CARE IF:   You have an injury or infection that is not healing.  You feel very dizzy or begin vomiting.  You have chest pain.  You have trouble breathing. Document Released: 05/04/2002 Document Revised: 01/24/2011 Document Reviewed: 01/19/2013 [facility] Patient Information 2015 [place], [place]. This information is not intended to replace advice given to you by your health care provider. Make sure you discuss any questions you have with your health care provider.

## 2015-01-09 NOTE — ED Provider Notes (Signed)
CSN: [id number]     Arrival date & time 01/09/15  0202 History   First MD Initiated Contact with Patient 01/09/15 [phone]     Chief Complaint  Patient presents with  . Chest Pain  . Palpitations   Harold Morris is a 64 y.o. male with a history of PE on Xarelto, lung CA, DM, HLD, and AAA who presents to the ED with multiple complaints. He reports having right sided neck pain since he was discharged one week ago. He also reports feeling like his right arm is weak and he has pain in his neck when he moves his arm. He reports having chronic neck pain. He reports having intermittent chest pain and that it hurts to touch his chest. He complains of epigastric abdominal pain and lower abdominal pain but denies current abdominal pain to me. He denies any new numbness or tingling. He denies any falls or trauma. He denies fevers, chills, recent illness, nausea, vomiting, diarrhea, falls, trauma, changes to his vision, shortness of breath, palpitations, urinary symptoms, or rashes.  (Consider location/radiation/quality/duration/timing/severity/associated sxs/prior Treatment) HPI  Past Medical History  Diagnosis Date  . Lung cancer   . Diabetes mellitus without complication   . High cholesterol   . Gout    History reviewed. No pertinent past surgical history. History reviewed. No pertinent family history. Social History  Substance Use Topics  . Smoking status: Former Research scientist (life sciences)  . Smokeless tobacco: None  . Alcohol Use: No    Review of Systems  Constitutional: Negative for fever and chills.  HENT: Negative for congestion, ear pain and sore throat.   Eyes: Negative for visual disturbance.  Respiratory: Negative for cough, shortness of breath and wheezing.   Cardiovascular: Positive for chest pain. Negative for palpitations and leg swelling.  Gastrointestinal: Positive for abdominal pain. Negative for nausea, vomiting, diarrhea and blood in stool.  Genitourinary: Negative for dysuria, urgency,  hematuria, flank pain and difficulty urinating.  Musculoskeletal: Positive for neck pain and neck stiffness. Negative for back pain.  Skin: Negative for rash.  Neurological: Positive for weakness. Negative for dizziness, syncope, light-headedness, numbness and headaches.      Allergies  Epinephrine; Sulfa antibiotics; Hydrocodone; and Morphine  Home Medications   Prior to Admission medications   Medication Sig Start Date End Date Taking? Authorizing Provider  allopurinol (ZYLOPRIM) 300 MG tablet Take 300 mg by mouth daily.   Yes Historical Provider, MD  aspirin EC 81 MG tablet Take 81 mg by mouth daily.   Yes Historical Provider, MD  Cyanocobalamin (VITAMIN B-12 CR) 1000 MCG TBCR Take 1,000 mg by mouth daily.   Yes Historical Provider, MD  ERGOCALCIFEROL PO Take 1 tablet by mouth daily.   Yes Historical Provider, MD  fenofibrate (TRICOR) 145 MG tablet Take 145 mg by mouth daily.   Yes Historical Provider, MD  gabapentin (NEURONTIN) 600 MG tablet Take 600-900 mg by mouth See admin instructions. Takes 2 caps in am and 3 caps at bedtime   Yes   HYDROcodone-acetaminophen (NORCO) 10-325 MG per tablet Take 1 tablet by mouth every 6 (six) hours as needed for moderate pain.   Yes Historical Provider, MD  magnesium oxide (MAG-OX) 400 MG tablet Take 400 mg by mouth 2 (two) times daily.   Yes Historical Provider, MD  meclizine (ANTIVERT) 25 MG tablet Take 25 mg by mouth daily as needed for dizziness.    Yes Historical Provider, MD  metFORMIN (GLUCOPHAGE) 500 MG tablet Take 500 mg by mouth daily before breakfast.  Yes Historical Provider, MD  omeprazole (PRILOSEC) 20 MG capsule Take 20 mg by mouth daily.   Yes Historical Provider, MD  Rivaroxaban (XARELTO STARTER PACK) 15 & 20 MG TBPK Take as directed on package: Start with one '15mg'$  tablet by mouth twice a day with food. On Day 22, switch to one '20mg'$  tablet once a day with food. 01/03/15  Yes Donne Hazel, MD  sertraline (ZOLOFT) 100 MG tablet Take  100 mg by mouth daily.   Yes Historical Provider, MD  methocarbamol (ROBAXIN) 500 MG tablet Take 1 tablet (500 mg total) by mouth 2 (two) times daily as needed for muscle spasms. 01/09/15   Waynetta Pean, PA-C   BP 125/77 mmHg  Pulse 74  Temp(Src) 98.2 F (36.8 C) (Oral)  Resp 18  Ht 5' 9.5" (1.765 m)  Wt 257 lb (116.574 kg)  BMI 37.42 kg/m2  SpO2 96% Physical Exam  Constitutional: He is oriented to person, place, and time. He appears well-developed and well-nourished. No distress.  Nontoxic appearing.  HENT:  Head: Normocephalic and atraumatic.  Right Ear: External ear normal.  Left Ear: External ear normal.  Mouth/Throat: Oropharynx is clear and moist. No oropharyngeal exudate.  Bilateral tympanic membranes are pearly-gray without erythema or loss of landmarks.   Eyes: Conjunctivae and EOM are normal. Pupils are equal, round, and reactive to light. Right eye exhibits no discharge. Left eye exhibits no discharge.  Neck: Neck supple. No JVD present. No tracheal deviation present. No Brudzinski's sign and no Kernig's sign noted.  Patient is able to rotate his head at least 45 in either direction and look upwards and downwards. He reports pain with active range of motion. He is tender along his right neck musculature. No meningeal signs.   Cardiovascular: Normal rate, regular rhythm, normal heart sounds and intact distal pulses.  Exam reveals no gallop and no friction rub.   No murmur heard. Pulmonary/Chest: Effort normal and breath sounds normal. No stridor. No respiratory distress. He has no wheezes. He has no rales. He exhibits tenderness.  Substernal chest is tender to palpation reproduces his chest pain.  Abdominal: Soft. He exhibits no distension and no mass. There is tenderness. There is no rebound and no guarding.  Patient's abdomen is soft. Bowel sounds are present year patient has mild epigastric tenderness to palpation.  Musculoskeletal: He exhibits no edema or tenderness.   No midline neck or back tenderness. The patient reports pain with movement of his right arm. He reports feeling his right arm is weak. There is no objective weakness on exam. Patient is spontaneously moving his bilateral upper and lower extremities without difficulty and exhibiting good strength.  Lymphadenopathy:    He has no cervical adenopathy.  Neurological: He is alert and oriented to person, place, and time. No cranial nerve deficit. Coordination normal.  The patient is alert and oriented 3. His cranial nerves are intact. Patient is spontaneously moving all extremities in a coordinated fashion exhibiting good strength.   Skin: Skin is warm and dry. No rash noted. He is not diaphoretic. No erythema. No pallor.  Psychiatric: He has a normal mood and affect. His behavior is normal.  Nursing note and vitals reviewed.   ED Course  Procedures (including critical care time) Labs Review Labs Reviewed  BASIC METABOLIC PANEL - Abnormal; Notable for the following:    Glucose, Bld 193 (*)    Creatinine, Ser 1.28 (*)    GFR calc non Af Amer 57 (*)    All  other components within normal limits  CBC - Abnormal; Notable for the following:    WBC 11.1 (*)    Hemoglobin 11.5 (*)    HCT 36.2 (*)    MCV 77.2 (*)    MCH 24.5 (*)    RDW 17.9 (*)    All other components within normal limits  PROTIME-INR - Abnormal; Notable for the following:    Prothrombin Time 19.0 (*)    INR 1.59 (*)    All other components within normal limits  URINALYSIS, ROUTINE W REFLEX MICROSCOPIC (NOT AT [facility])  Randolm Idol, ED    Imaging Review Dg Chest 2 View  01/09/2015   CLINICAL DATA:  Acute onset of generalized chest and neck pain. Shortness of breath. High blood pressure. Initial encounter.  EXAM: CHEST  2 VIEW  COMPARISON:  Chest radiograph and CTA of the chest performed 12/31/2014  FINDINGS: The lungs are hypoexpanded. There is elevation of the left hemidiaphragm, with left basilar atelectasis. There is no  evidence of pleural effusion or pneumothorax.  The heart is normal in size; the mediastinal contour is within normal limits. No acute osseous abnormalities are seen.  Clips are noted within the right upper quadrant, reflecting prior cholecystectomy.  IMPRESSION: Lungs hypoexpanded. Elevation of the left hemidiaphragm, with left basilar atelectasis.   Electronically Signed   By: Garald Balding M.D.   On: 01/09/2015 03:08   Ct Head Wo Contrast  01/09/2015   CLINICAL DATA:  Left-sided weakness.  EXAM: CT HEAD WITHOUT CONTRAST  TECHNIQUE: Contiguous axial images were obtained from the base of the skull through the vertex without intravenous contrast.  COMPARISON:  CT scan of April 08, 2009.  FINDINGS: Bony calvarium appears intact. Mild chronic ischemic white matter disease is noted. No mass effect or midline shift is noted. Ventricular size is within normal limits. There is no evidence of mass lesion, hemorrhage or acute infarction.  IMPRESSION: Mild chronic ischemic white matter disease. No acute intracranial abnormality seen.   Electronically Signed   By: Marijo Conception, M.D.   On: 01/09/2015 07:41   Ct Abdomen Pelvis W Contrast  01/09/2015   CLINICAL DATA:  64 year old male with right lower quadrant abdominal pain and tenderness. Epigastric pain.  EXAM: CT ABDOMEN AND PELVIS WITH CONTRAST  TECHNIQUE: Multidetector CT imaging of the abdomen and pelvis was performed using the standard protocol following bolus administration of intravenous contrast.  CONTRAST:  168m OMNIPAQUE IOHEXOL 300 MG/ML  SOLN  COMPARISON:  No priors.  FINDINGS: Lower chest:  Mild scarring in the lung bases bilaterally.  Hepatobiliary: No cystic or solid hepatic lesions. No intra or extrahepatic biliary ductal dilatation. Status post cholecystectomy.  Pancreas: No pancreatic mass. No pancreatic ductal dilatation. No pancreatic or peripancreatic fluid or inflammatory changes.  Spleen: Unremarkable.  Adrenals/Urinary Tract: Bilateral  adrenal glands and bilateral kidneys are normal in appearance. No hydroureteronephrosis. 2.6 cm diverticulum from the right side of the urinary bladder again noted. Bilateral adrenal glands are normal in appearance.  Stomach/Bowel: Normal appearance of the stomach. No pathologic dilatation of small bowel or colon. Normal appendix.  Vascular/Lymphatic: Atherosclerosis throughout the abdominal and pelvic vasculature, including fusiform aneurysmal dilatation of the infrarenal abdominal aorta which measures up to 3.2 x 3.2 cm. No lymphadenopathy noted in the abdomen or pelvis.  Reproductive: Prostate gland and seminal vesicles are unremarkable in appearance.  Other: No significant volume of ascites.  No pneumoperitoneum.  Musculoskeletal: Posterior fixation device in position extending from L4-S2. Status post laminectomy at  L3. There are no aggressive appearing lytic or blastic lesions noted in the visualized portions of the skeleton.  IMPRESSION: 1. No acute findings in the abdomen or pelvis. 2. Specifically, the appendix is normal. 3. Small right-sided bladder wall diverticulum. 4. Atherosclerosis, including a 3.2 cm infrarenal abdominal aortic aneurysm. Recommend followup by ultrasound in 3 years. This recommendation follows ACR consensus guidelines: White Paper of the ACR Incidental Findings Committee II on Vascular Findings. J Am Coll Radiol 2013; 10:789-794. 5. Additional incidental findings, as above.   Electronically Signed   By: Vinnie Langton M.D.   On: 01/09/2015 12:01   I, Hanley Hays, personally reviewed and evaluated these images and lab results as part of my medical decision-making.   EKG Interpretation None      Filed Vitals:   01/09/15 1135 01/09/15 1200 01/09/15 1217 01/09/15 1230  BP: 125/72 125/81 125/81 125/77  Pulse: 72 73 73 74  Temp:      TempSrc:      Resp: '18 18 20 18  '$ Height:      Weight:      SpO2: 99% 96% 98% 96%     MDM   Meds given in ED:  Medications   HYDROmorphone (DILAUDID) injection 1 mg (1 mg Intramuscular Given 01/09/15 0917)  iohexol (OMNIPAQUE) 300 MG/ML solution 25 mL (25 mLs Oral Contrast Given 01/09/15 0931)  iohexol (OMNIPAQUE) 300 MG/ML solution 100 mL (100 mLs Intravenous Contrast Given 01/09/15 1036)  HYDROmorphone (DILAUDID) injection 0.5 mg (0.5 mg Intravenous Given 01/09/15 1236)  methocarbamol (ROBAXIN) tablet 500 mg (500 mg Oral Given 01/09/15 1236)  sodium chloride 0.9 % bolus 1,000 mL (0 mLs Intravenous Stopped 01/09/15 1252)    Discharge Medication List as of 01/09/2015 12:48 PM    START taking these medications   Details  methocarbamol (ROBAXIN) 500 MG tablet Take 1 tablet (500 mg total) by mouth 2 (two) times daily as needed for muscle spasms., Starting 01/09/2015, Until Discontinued, Print        Final diagnoses:  Neck pain  Epigastric abdominal pain  Chest pain, unspecified chest pain type  History of pulmonary embolism   This  is a 64 y.o. male with a history of PE on Xarelto, lung CA, DM, HLD, and AAA who presents to the ED with multiple complaints. He reports having right sided neck pain since he was discharged one week ago. He also reports feeling like his right arm is weak and he has pain in his neck when he moves his arm. He reports having chronic neck pain. He reports having intermittent chest pain and that it hurts to touch his chest. He complains of epigastric abdominal pain and lower abdominal pain but denies current abdominal pain to me. He denies any new numbness or tingling. He denies any falls or trauma. He denies fevers, chills. He denies any nausea or vomiting. On exam the patient is afebrile nontoxic appearing. The patient is not tachypneic, tachycardic or hypoxic. He reports being compliant with his Xarelto for PE.  Patient's abdomen soft and has mild epigastric and bilateral lower abdominal tenderness to palpation. Patient has no meningeal signs. He is able to rotate his head and at least 45 in each  direction. Patient complains of right-sided arm weakness however he has no objective weakness on exam. He has no focal neurological deficits. Urinalysis is unremarkable. Troponin is negative. BMP shows a creatinine 1.28 which is an improvement from his baseline. CBC shows a leukocytosis with a white count of 11.1. His  INR is 1.59. Chest x-ray was obtained which shows no acute cardiopulmonary process. He has left basilar atelectasis consistent with his previous left lobe lobectomy. CT head without contrast shows no acute intracranial abnormality. CT abdomen and pelvis was obtained as the patient DM complaining of worsening belly pain. There are no acute findings in the abdomen or pelvis. Reevaluation the patient reports that the Dilaudid helped with his neck pain. He reports now he is able to move his right arm without any difficulty and without any subjective weakness. He places his right arm above his head to show me. Patient has chronic neck pain. Will give patient a prescription for Robaxin and have him closely follow up with PCP and physical therapy as he was previously directed. Strict return precautions. I advised the patient to follow-up with their primary care provider this week. I advised the patient to return to the emergency department with new or worsening symptoms or new concerns. The patient verbalized understanding and agreement with plan.    This patient was discussed with and evaluated by Dr. Ashok Cordia who agrees with assessment and plan.   Waynetta Pean, PA-C 01/09/15 [place], MD 01/13/15 [phone]

## 2015-01-12 ENCOUNTER — Ambulatory Visit: Payer: Medicare Other | Admitting: Physical Therapy

## 2015-01-18 ENCOUNTER — Ambulatory Visit (INDEPENDENT_AMBULATORY_CARE_PROVIDER_SITE_OTHER): Payer: Medicare Other | Admitting: Family

## 2015-01-18 ENCOUNTER — Encounter: Payer: Self-pay | Admitting: Family

## 2015-01-18 VITALS — BP 130/78 | HR 90 | Temp 98.0°F | Resp 18 | Ht 70.0 in | Wt 245.0 lb

## 2015-01-18 DIAGNOSIS — I2699 Other pulmonary embolism without acute cor pulmonale: Secondary | ICD-10-CM | POA: Diagnosis not present

## 2015-01-18 DIAGNOSIS — E119 Type 2 diabetes mellitus without complications: Secondary | ICD-10-CM | POA: Diagnosis not present

## 2015-01-18 DIAGNOSIS — M25551 Pain in right hip: Secondary | ICD-10-CM | POA: Diagnosis not present

## 2015-01-18 DIAGNOSIS — M25559 Pain in unspecified hip: Secondary | ICD-10-CM | POA: Insufficient documentation

## 2015-01-18 MED ORDER — ACARBOSE 25 MG PO TABS: 25.0000 mg | ORAL_TABLET | Freq: Three times a day (TID) | ORAL | Status: DC

## 2015-01-18 NOTE — Patient Instructions (Addendum)
Thank you for choosing [facility].  Summary/Instructions:  Please discontinue metformin and start ACARBOSE with each meal.   Your prescription(s) have been submitted to your pharmacy or been printed and provided for you. Please take as directed and contact our office if you believe you are having problem(s) with the medication(s) or have any questions.  If your symptoms worsen or fail to improve, please contact our office for further instruction, or in case of emergency go directly to the emergency room at the closest medical facility.    TREATMENT Treatment initially involves the use of ice and medication to help reduce pain and inflammation. The use of strengthening and stretching exercises may help reduce pain with activity. These exercises may be performed at home or with referral to a therapist. If you have flat feet, your caregiver may recommend that you wear arch supports. Your caregiver may recommend corticosteroid injections to help reduce inflammation that may be causing the mechanical problems in the hip. If symptoms persist for greater than 6 months despite nonsurgical (conservative) treatment, then surgery may be recommended. MEDICATION   If pain medication is necessary, then nonsteroidal anti-inflammatory medications, such as aspirin and ibuprofen, or other minor pain relievers, such as acetaminophen, are often recommended.  Do not take pain medication for 7 days before surgery.  Prescription pain relievers may be given if deemed necessary by your caregiver. Use only as directed and only as much as you need.  Corticosteroid injections may be given by your caregiver. These injections should be reserved for the most serious cases, because they may only be given a certain number of times. HEAT AND COLD  Cold treatment (icing) relieves pain and reduces inflammation. Cold treatment should be applied for 10 to 15 minutes every 2 to 3 hours for inflammation and pain and  immediately after any activity that aggravates your symptoms. Use ice packs or massage the area with a piece of ice (ice massage).  Heat treatment may be used prior to performing the stretching and strengthening activities prescribed by your caregiver, physical therapist, or athletic trainer. Use a heat pack or soak your injury in warm water. SEEK MEDICAL CARE IF:  Treatment seems to offer no benefit, or the condition worsens.  Any medications produce adverse side effects. EXERCISES RANGE OF MOTION (ROM) AND STRETCHING EXERCISES - Snapping Hip Syndrome These exercises may help you when beginning to rehabilitate your injury. Your symptoms may resolve with or without further involvement from your physician, physical therapist or athletic trainer. While completing these exercises, remember:   Restoring tissue flexibility helps normal motion to return to the joints. This allows healthier, less painful movement and activity.  An effective stretch should be held for at least 30 seconds.  A stretch should never be painful. You should only feel a gentle lengthening or release in the stretched tissue. STRETCH - Hip Rotators  Lie on your back on a firm surface. Grasp your right / left knee with your right / left hand and your ankle with your opposite hand.  Keeping your hips and shoulders firmly planted, gently pull your right / left knee and rotate your lower leg toward your opposite shoulder until you feel a stretch in your buttocks.  Hold this stretch for __________ seconds. Repeat this stretch __________ times. Complete this stretch __________ times per day. STRETCH - Iliotibial Band   On the floor or bed, lie on your side so your right / left leg is on top. Bend your knee and grab your  ankle.  Slowly bring your knee back so that your thigh is in line with your trunk. Keep your heel at your buttocks and gently arch your back so your head, shoulders, and hips line up.  Slowly lower your leg  so that your knee approaches the floor/bed until you feel a gentle stretch on the outside of your right / left thigh. If you do not feel a stretch and your knee will not fall farther, place the heel of your opposite foot on top of your knee and pull your thigh down farther.  Hold this stretch for __________ seconds. Repeat __________ times. Complete this exercise __________ times per day. STRETCH - Hip Adductors, Sitting  Sit on the floor and place the bottoms of your feet together. Keep your chest up and look straight ahead to keep your back in proper alignment. Slide your feet in towards your body as far as you can without rounding your back or increasing any discomfort.  Gently push down on your knees until you feel a gentle stretch in your inner thighs. Hold this position for __________ seconds. Repeat __________ times. Complete this exercise __________ times per day.  STRETCHING - Hip Flexors, Lunge  Half kneel with your right / left knee on the floor and your opposite knee bent and directly over your ankle.  Keep good posture with your head over your shoulders. Tighten your buttocks to point your tailbone downward; this will prevent your back from arching too much.  You should feel a gentle stretch in the front of your right / left thigh and/or hip. If you do not feel any resistance, slightly slide your opposite foot forward and then slowly lunge forward so your knee once again lines up over your ankle. Be sure your tailbone remains pointed downward.  Hold this stretch for __________ seconds. Repeat __________ times. Complete this stretch __________ times per day. STRENGTHENING EXERCISES - Snapping Hip Syndrome These exercises may help you when beginning to rehabilitate your injury. They may resolve your symptoms with or without further involvement from your physician, physical therapist or athletic trainer. While completing these exercises, remember:   Muscles can gain both the  endurance and the strength needed for everyday activities through controlled exercises.  Complete these exercises as instructed by your physician, physical therapist or athletic trainer. Progress with the resistance and repetition exercises only as your caregiver advises.  You may experience muscle soreness or fatigue, but the pain or discomfort you are trying to eliminate should never worsen during these exercises. If this pain does worsen, stop and make certain you are following the directions exactly. If the pain is still present after adjustments, discontinue the exercise until you can discuss the trouble with your clinician. STRENGTH - Hip Abductors, Straight Leg Raises Be aware of your form throughout the entire exercise so that you exercise the correct muscles. Sloppy form means that you are not strengthening the correct muscles.  Lie on your side so that your head, shoulders, knee and hip line up. You may bend your lower knee to help maintain your balance. Your right / left leg should be on top.  Roll your hips slightly forward, so that your hips are stacked directly over each other and your right / left knee is facing forward.  Lift your top leg up 4-6 inches, leading with your heel. Be sure that your foot does not drift forward or that your knee does not roll toward the ceiling.  Hold this position for __________ seconds.  You should feel the muscles in your outer hip lifting (you may not notice this until your leg begins to tire).  Slowly lower your leg to the starting position. Allow the muscles to fully relax before beginning the next repetition. Repeat __________ times. Complete this exercise __________ times per day.  STRENGTH - Hip Abductors, Quadruped  On a firm, lightly padded surface, position yourself on your hands and knees. Your hands should be directly below your shoulders and your knees should be directly below your hips.  Keeping your right / left knee bent, lift your  leg out to the side. Keep your legs level and in line with your shoulders.  Position yourself on your hands and knees.  Hold for __________ seconds.  Keeping your trunk steady and your hips level, slowly lower your leg to the starting position. Repeat __________ times. Complete this exercise __________ times per day.  STRENGTH - Hip Abductors, Standing Be aware of your form throughout the entire exercise so that you exercise the correct muscles. Sloppy form means that you are not strengthening the correct muscles.  Tie one end of a rubber exercise band/tubing to a secure surface (table, pole) and tie a loop at the other end.  Place the loop around your right / left ankle. Turn your body sideways so that your opposite side faces the table or pole and your right / left leg away from the table or pole. Step away from the pole or table until there is tension in the tube/band.  Hold onto a chair as needed for balance.  Keeping your back upright, your shoulders over your hips, and your toes pointing forward, lift your right / left leg out to your side. Be sure to lift your leg with your hip muscles. Do not "throw" your leg or tip your body to lift your leg.  Slowly and with control, return to the starting position. Repeat exercise __________ times. Complete this exercise __________ times per day.  Document Released: 05/14/2005 Document Revised: 09/28/2013 Document Reviewed: 08/26/2008 [facility] Patient Information 2015 [place], [place]. This information is not intended to replace advice given to you by your health care provider. Make sure you discuss any questions you have with your health care provider.

## 2015-01-18 NOTE — Assessment & Plan Note (Signed)
Diabetes fairly well controlled with current regimen of metformin. Patient requests to discontinue metformin secondary to side effects. Discontinue metformin. Start acarbose. May require additional medications pending trial of acarbose. Continue to monitor blood sugar at home. Follow-up pending trial of acarbose.

## 2015-01-18 NOTE — Assessment & Plan Note (Addendum)
Right hip pain most likely related to arthritis, however cannot rule out greater trochanteric bursitis. Obtained previously resulted x-rays from the [facility]. Continue conservative treatment at this time with over-the-counter medications, home exercise therapy and stretching as well as ice and heat as needed. Follow-up pending trial of home therapy and formal physical therapy.

## 2015-01-18 NOTE — Progress Notes (Addendum)
Subjective:    Patient ID: Harold Morris, male    DOB: 09-Mar-1951, 64 y.o.   MRN: [id number]  Chief Complaint  Patient presents with  . Establish Care    had some lightheadedness occurs every once in a while, a blood clot was found in his lung 16 days ago    HPI:  Harold Morris is a 64 y.o. male who  has a past medical history of Lung cancer; Diabetes mellitus without complication; High cholesterol; Gout; Fractured patella; Low back pain; and Neck pain.who presents today for an office visit to establish visit to establish care.    1.) Blood clot - Recently found to have a blood clot located in his right lung that he describes was putting pressure on his heart. He was started on Xarelto. Describes occasional chest pain. There is some shortness of breath with increased exertion. Takes the medication as prescribed and denies adverse side effects. Denies nusicane bleeding  2.) Diabetes - Currently maintained on metformin. Takes the medication as prescribed and denies adverse side effects. He does have concern for the metformin's effects on his diabetes. Indicates that he has difficulty walking and exercising secondary to his hip and back pain.   Lab Results  Component Value Date   HGBA1C 7.1* 01/01/2015    3.) Pain in hip - Associated symptom of pain located in his right hip described as deep in his hip has been going on for quite a while as he describes. Imaging has been performed at the [facility] and told that it looked okay. He has been scheduled for formal physical therapy for his back and hip and is requesting a referral for that appointment.     Allergies  Allergen Reactions  . Epinephrine Anaphylaxis  . Sulfa Antibiotics Other (See Comments)    unknown  . Oxycontin [Oxycodone Hcl]   . Morphine Other (See Comments)    hallucinations     Outpatient Prescriptions Prior to Visit  Medication Sig Dispense Refill  . allopurinol (ZYLOPRIM) 300 MG tablet Take 300 mg  by mouth daily.    . Cyanocobalamin (VITAMIN B-12 CR) 1000 MCG TBCR Take 1,000 mg by mouth daily.    . ERGOCALCIFEROL PO Take 1 tablet by mouth daily.    . fenofibrate (TRICOR) 145 MG tablet Take 145 mg by mouth daily.    Marland Kitchen gabapentin (NEURONTIN) 600 MG tablet Take 600-900 mg by mouth See admin instructions. Takes 2 caps in am and 3 caps at bedtime    . HYDROcodone-acetaminophen (NORCO) 10-325 MG per tablet Take 1 tablet by mouth every 6 (six) hours as needed for moderate pain.    . magnesium oxide (MAG-OX) 400 MG tablet Take 400 mg by mouth 2 (two) times daily.    . meclizine (ANTIVERT) 25 MG tablet Take 25 mg by mouth daily as needed for dizziness.     . methocarbamol (ROBAXIN) 500 MG tablet Take 1 tablet (500 mg total) by mouth 2 (two) times daily as needed for muscle spasms. 20 tablet 0  . omeprazole (PRILOSEC) 20 MG capsule Take 20 mg by mouth daily.    . Rivaroxaban (XARELTO STARTER PACK) 15 & 20 MG TBPK Take as directed on package: Start with one '15mg'$  tablet by mouth twice a day with food. On Day 22, switch to one '20mg'$  tablet once a day with food. 51 each 0  . sertraline (ZOLOFT) 100 MG tablet Take 100 mg by mouth daily.    . metFORMIN (GLUCOPHAGE) 500  MG tablet Take 500 mg by mouth daily before breakfast.    . aspirin EC 81 MG tablet Take 81 mg by mouth daily.     No facility-administered medications prior to visit.     Past Medical History  Diagnosis Date  . Lung cancer   . Diabetes mellitus without complication   . High cholesterol   . Gout   . Fractured patella   . Low back pain   . Neck pain      Past Surgical History  Procedure Laterality Date  . Back surgery      x4  . Joint replacement Left     Knee  . Cholecystectomy       Family History  Problem Relation Age of Onset  . Diabetes Mother      Social History   Social History  . Marital Status: Married    Spouse Name: N/A  . Number of Children: 2  . Years of Education: 8   Occupational History  .  Not on file.   Social History Main Topics  . Smoking status: Former Research scientist (life sciences)  . Smokeless tobacco: Never Used  . Alcohol Use: Yes     Comment: Rarely  . Drug Use: No  . Sexual Activity: Not on file   Other Topics Concern  . Not on file   Social History Narrative   Fun: Fish    Denies religious beliefs effecting health care.      Review of Systems  Constitutional: Negative for fever and chills.  Respiratory: Positive for chest tightness and shortness of breath.   Endocrine: Negative for polydipsia, polyphagia and polyuria.  Hematological: Does not bruise/bleed easily.      Objective:    BP 130/78 mmHg  Pulse 90  Temp(Src) 98 F (36.7 C) (Oral)  Resp 18  Ht '5\' 10"'$  (1.778 m)  Wt 245 lb (111.131 kg)  BMI 35.15 kg/m2  SpO2 98% Nursing note and vital signs reviewed.  Physical Exam  Constitutional: He is oriented to person, place, and time. He appears well-developed and well-nourished. No distress.  Cardiovascular: Normal rate, regular rhythm, normal heart sounds and intact distal pulses.   Pulmonary/Chest: Effort normal and breath sounds normal.  Musculoskeletal:  Right hip - no obvious deformity, discoloration, or edema noted. No palpable tenderness able to be elicited upon examination. Range of motion is similar to the contralateral side. Distal pulses and sensation are intact and appropriate. Negative hip grinding.  Neurological: He is alert and oriented to person, place, and time.  Skin: Skin is warm and dry.  Psychiatric: He has a normal mood and affect. His behavior is normal. Judgment and thought content normal.       Assessment & Plan:   Problem List Items Addressed This Visit      Cardiovascular and Mediastinum   Acute pulmonary embolism    Acute blood clot appears stable with current dosage of Xarelto. Takes the medication as prescribed and denies adverse side effects and nuisance bleeding. Continue Xarelto as prescribed at current dosage. Refer to  pulmonology for assistance in management with PE.       Relevant Medications   atorvastatin (LIPITOR) 80 MG tablet   Other Relevant Orders   Ambulatory referral to Pulmonology     Endocrine   DM type 2 (diabetes mellitus, type 2) - Primary (Chronic)    Diabetes fairly well controlled with current regimen of metformin. Patient requests to discontinue metformin secondary to side effects. Discontinue metformin. Start acarbose. May  require additional medications pending trial of acarbose. Continue to monitor blood sugar at home. Follow-up pending trial of acarbose.       Relevant Medications   atorvastatin (LIPITOR) 80 MG tablet   acarbose (PRECOSE) 25 MG tablet     Other   Hip pain    Right hip pain most likely related to arthritis, however cannot rule out greater trochanteric bursitis. Obtained previously resulted x-rays from the [facility]. Continue conservative treatment at this time with over-the-counter medications, home exercise therapy and stretching as well as ice and heat as needed. Follow-up pending trial of home therapy and formal physical therapy.       Relevant Orders   Ambulatory referral to Physical Therapy

## 2015-01-18 NOTE — Progress Notes (Signed)
Pre visit review using our clinic review tool, if applicable. No additional management support is needed unless otherwise documented below in the visit note. 

## 2015-01-18 NOTE — Assessment & Plan Note (Signed)
Acute blood clot appears stable with current dosage of Xarelto. Takes the medication as prescribed and denies adverse side effects and nuisance bleeding. Continue Xarelto as prescribed at current dosage. Refer to pulmonology for assistance in management with PE.

## 2015-01-26 ENCOUNTER — Ambulatory Visit: Payer: Medicare Other | Attending: Internal Medicine | Admitting: Physical Therapy

## 2015-01-26 DIAGNOSIS — M25551 Pain in right hip: Secondary | ICD-10-CM | POA: Diagnosis present

## 2015-01-26 DIAGNOSIS — M545 Low back pain, unspecified: Secondary | ICD-10-CM

## 2015-01-26 DIAGNOSIS — R1031 Right lower quadrant pain: Secondary | ICD-10-CM

## 2015-01-26 DIAGNOSIS — R1032 Left lower quadrant pain: Secondary | ICD-10-CM

## 2015-01-26 DIAGNOSIS — R1030 Lower abdominal pain, unspecified: Secondary | ICD-10-CM | POA: Diagnosis present

## 2015-01-26 DIAGNOSIS — R2681 Unsteadiness on feet: Secondary | ICD-10-CM

## 2015-01-26 DIAGNOSIS — R293 Abnormal posture: Secondary | ICD-10-CM | POA: Diagnosis present

## 2015-01-26 NOTE — Therapy (Signed)
[place], [place], [address] Phone: [phone]   Fax:  [phone]  Physical Therapy Evaluation  Patient Details  Name: Harold Morris MRN: [id number] Date of Birth: 20-Feb-1951 Referring Provider:  Donne Hazel, MD  Encounter Date: 01/26/2015      PT End of Session - 01/26/15 1115    Visit Number 1   Number of Visits 16   Date for PT Re-Evaluation 03/23/15   PT Start Time 3007   PT Stop Time 1110   PT Time Calculation (min) 55 min   Activity Tolerance Patient tolerated treatment well   Behavior During Therapy [facility] for tasks assessed/performed      Past Medical History  Diagnosis Date  . Lung cancer   . Diabetes mellitus without complication   . High cholesterol   . Gout   . Fractured patella   . Low back pain   . Neck pain     Past Surgical History  Procedure Laterality Date  . Back surgery      x4  . Joint replacement Left     Knee  . Cholecystectomy      There were no vitals filed for this visit.  Visit Diagnosis:  Bilateral low back pain without sciatica - Plan: PT plan of care cert/re-cert  Right hip pain - Plan: PT plan of care cert/re-cert  Bilateral groin pain - Plan: PT plan of care cert/re-cert  Abnormal posture - Plan: PT plan of care cert/re-cert  Unsteadiness - Plan: PT plan of care cert/re-cert      Subjective Assessment - 01/26/15 1021    Subjective pt is a 64 y.o M with CC of R  low back pain and R hip pain that has been going on for " so long that I can't remember" which started when he reports ruputring a disc, but that has increased in the last 5 years. He reports the pain has been getting better since I've been taking the xeralto.   Limitations Lifting;House hold activities;Sitting;Standing  riding tractor, Public affairs consultant   How long can you sit comfortably? 30 min   How long can you stand comfortably? 5 min   How long can you walk comfortably? 200 ft   Diagnostic tests per pt report the last couple of months at [place] and stated they found nothing wrong.    Patient Stated Goals walking, to decrease the pain, get  back to fishing.    Currently in Pain? Yes   Pain Score 3    Pain Location Back   Pain Orientation Right;Left   Pain Descriptors / Indicators Aching   Pain Type Chronic pain   Pain Radiating Towards Down both legs with R>L to the toes.   Pain Onset More than a month ago   Pain Frequency Constant   Aggravating Factors  standing, bending forward, twisting and turning,    Pain Relieving Factors sitting down and resting in a comfortable position in a recliner with the feet up.    Multiple Pain Sites Yes   Pain Score 3   Pain Location Hip   Pain Orientation Right   Pain Descriptors / Indicators Dull;Aching   Pain Type Chronic pain   Pain Onset More than a month ago   Pain Frequency Intermittent   Aggravating Factors  walking, standing   Pain Relieving Factors sitting and resting, getting in recliner in comfortable positiion.  [facility] PT Assessment - 01/26/15 1031    Assessment   Medical Diagnosis Low back pain and R hip pain   Onset Date/Surgical Date --  pt reports for along time but worse in 5 years   Hand Dominance Right   Next MD Visit pt reports he doesn't keep up with it   Prior Therapy yes  back and hip   Precautions   Precaution Comments Not to do anything that may bring on heavy breathing  for previous blood clot   Restrictions   Weight Bearing Restrictions No   Balance Screen   Has the patient fallen in the past 6 months No   Has the patient had a decrease in activity level because of a fear of falling?  No   Is the patient reluctant to leave their home because of a fear of falling?  No   Home Environment   Living Environment Private residence   Living Arrangements Spouse/significant other   Available Help at Discharge Available PRN/intermittently;Available 24 hours/day   Type of Home House    Home Access Stairs to enter;Ramped entrance   Entrance Stairs-Number of Steps 3   Home Layout One level   [place] - 2 wheels   Additional Comments reports can't use the walker due to the pressure on shoulders and neck   Prior Function   Level of Independence Needs assistance with ADLs;Needs assistance with homemaking   Vocation Retired   [employer], taking the grand kids off.    Cognition   Overall Cognitive Status Within Functional Limits for tasks assessed   Posture/Postural Control   Posture/Postural Control Postural limitations   Postural Limitations Rounded Shoulders;Forward head;Decreased lumbar lordosis   ROM / Strength   AROM / PROM / Strength AROM;PROM;Strength   AROM   AROM Assessment Site Lumbar;Hip   Right/Left Hip Right;Left   Right Hip Extension 16   Right Hip Flexion 90  pain during movement   Right Hip ABduction 30   Right Hip ADduction 10   Left Hip Extension 18   Left Hip Flexion 94  pain during movement   Left Hip ABduction 30   Left Hip ADduction 10   Lumbar Flexion 51   Lumbar Extension 10   Lumbar - Right Side Bend 10   Lumbar - Left Side Bend 15   PROM   PROM Assessment Site Hip   Right/Left Hip Right;Left   Left Hip Extension 20   Left Hip Flexion 90  pain in groin   Strength   Strength Assessment Site Hip;Knee   Right/Left Hip Left;Right   Right Hip Flexion 3+/5  pain in groin, knee and toes at testing   Right Hip Extension 4+/5   Right Hip ABduction 4+/5   Right Hip ADduction 4+/5   Left Hip Flexion 3+/5  pain in the groin upon testing   Left Hip Extension 4+/5   Left Hip ABduction 4+/5   Left Hip ADduction 4+/5   Right/Left Knee Right;Left   Right Knee Flexion 5/5   Right Knee Extension 5/5   Left Knee Flexion 5/5   Left Knee Extension 5/5   Right Hip   Right Hip Extension 20   Right Hip Flexion 90  pain in groin   Right Hip ABduction 30   Right Hip ADduction 15   Palpation   Spinal mobility hypomobility in  L1-L5 with tenderness upon palpation.    Palpation comment tenderness located at Bil femoral triangles, and along bil parapsinals  Special Tests    Special Tests Lumbar;Hip Special Tests   Lumbar Tests Slump Test;Prone Knee Bend Test;Straight Leg Raise   Hip Special Tests  Hip Scouring;Thomas Test;Ober's Test;Craig's Test   Slump test   Findings Negative   Prone Knee Bend Test   Findings Negative   Straight Leg Raise   Findings Negative   Thomas Test    Findings Positive   Side --  bil   Ober's Test   Findings Negative   Hip Scouring   Findings Positive   Side Right   Ambulation/Gait   Gait Pattern Step-through pattern;Decreased stride length;Antalgic;Wide base of support                           PT Education - 01/26/15 1115    Education provided Yes   Education Details evaluation findings, POC, Goals, HEP   Person(s) Educated Patient   Methods Explanation   Comprehension Verbalized understanding          PT Short Term Goals - 01/26/15 1123    PT SHORT TERM GOAL #1   Title pt will be I with inital HEP (9/31/2016)   Time 4   Period Weeks   Status New   PT SHORT TERM GOAL #2   Title pt will increase trunk mobility by > 8 degrees in all planes to help with ADLS (6/76/7209)   Time 4   Period Weeks   Status New   PT SHORT TERM GOAL #3   Title He will be able to stand/walk for > 5 minutes with < 6/10 pain to help promote walking and standing endurance (9/31/2016)   Time 4   Period Weeks   Status New   PT SHORT TERM GOAL #4   Title He will be able to verbalize and demonstrate techniques to reduce reinjury to the low back or hip via postural awareness, lifting and carrying mechanics, and HEP (9/31/2016)   Time 4   Period Weeks   Status New           PT Long Term Goals - 01/26/15 1125    PT LONG TERM GOAL #1   Title At discharge pt will be I with all HEP given throughout therapy  (03/23/2015)   Time 8   Period Weeks   Status New   PT  LONG TERM GOAL #2   Title He will demonstrate incrased trunk mobilty by > 10 degrees in all planes to assist with ADLs and safety during driving (47/01/6282)   Time 8   Period Weeks   Status New   PT LONG TERM GOAL #3   Title pt will be able to stand/ walk for > 20 minutes with < 5/10 pain to assist with functional endurance and help with personal goal of be able to go fishing (03/23/2015)   Time 8   Period Weeks   Status New   PT LONG TERM GOAL #4   Title pt will be able to maintain Rhomberg/tandem stance for > 15 seconds with minimal postural sway to promote safety during gait (03/23/2015)   Time 8   Period Weeks   Status New               Plan - 01/26/15 1116    Clinical Impression Statement Mr. Griesinger presents to OPPT with CC of low back pain and R hip pain that has been gradually worsening over the last 5 years. He reports a relief in symptoms  since he was started on a blood thinner. He states he has limitation with walking longer than 200 ft but is working on it. He demonstrates limited trunk mobility in all planes secondary to pain, and limited hip ROM due to pain that is present in the groin bil  during movement. Palpation reveals tenderness along the femoral triangle bil with increased pain and bulging while blowing out against a closed fist, which may indicate involvemnt of the hx of a AAA or inguinal herniaSpecial testing was postive with Fabers bil and hip scour test on the R, all special testing was negative for  lumbar spine. pt reports history of vertigo feeling unsteady. Plan to assess balance next visit.  He would benefit from skilled physical therapy to maximize his function and decrease pain by addressing the impairments listed.    Pt will benefit from skilled therapeutic intervention in order to improve on the following deficits Decreased endurance;Decreased balance;Decreased activity tolerance;Decreased range of motion;Difficulty walking;Dizziness;Hypomobility;Improper  body mechanics;Pain;Postural dysfunction;Abnormal gait   Rehab Potential Good   PT Frequency 2x / week   PT Duration 8 weeks   PT Next Visit Plan assess response to HEP, berg balance, trunk mobility, core strengthening, Hip PROM/AAROM, Modalities PRN  Hx of blood clot and AAA   PT Home Exercise Plan heel slides, lower trunk rotation, hamstring stretch, and pelvic tilts.    Consulted and Agree with Plan of Care Patient          G-Codes - 11-Feb-2015 1131    Functional Assessment Tool Used Clinical Judgement   Functional Limitation Changing and maintaining body position   Changing and Maintaining Body Position Current Status [phone]) At least 80 percent but less than 100 percent impaired, limited or restricted   Changing and Maintaining Body Position Goal Status ([id number]) At least 40 percent but less than 60 percent impaired, limited or restricted       Problem List Patient Active Problem List   Diagnosis Date Noted  . Hip pain 01/18/2015  . Acute pulmonary embolism 01/01/2015  . DM type 2 (diabetes mellitus, type 2) 01/01/2015  . Gout 01/01/2015  . History of lung cancer 01/01/2015   Starr Lake PT, DPT, LAT, ATC  2015-02-11  11:41 AM      [facility] [address] [place], [place], [address] Phone: [phone]   Fax:  [phone]

## 2015-01-26 NOTE — Patient Instructions (Signed)
   Ashtan Girtman PT, DPT, LAT, ATC  [facility] Phone: [phone]     

## 2015-02-01 ENCOUNTER — Telehealth: Payer: Self-pay | Admitting: Family

## 2015-02-01 NOTE — Telephone Encounter (Signed)
Received records from department of [facility] forwarded 46 pages to Dr. Elna Breslow 02/01/15 fbg.

## 2015-02-01 NOTE — Telephone Encounter (Signed)
Patient is stating he can not walk. Can you please call him to discuss this I tried to make an appointment but he said he can't because of rehab

## 2015-02-03 NOTE — Telephone Encounter (Signed)
Pt wanted to get advise about PT for his legs. I told him it would not hurt to see if PT will help strengthen his legs. He is going to try PT before he makes an appt to come in.

## 2015-02-07 ENCOUNTER — Ambulatory Visit: Payer: Medicare Other | Admitting: Physical Therapy

## 2015-02-07 ENCOUNTER — Ambulatory Visit: Payer: Medicare Other | Attending: Internal Medicine

## 2015-02-07 DIAGNOSIS — M545 Low back pain, unspecified: Secondary | ICD-10-CM

## 2015-02-07 DIAGNOSIS — R1032 Left lower quadrant pain: Secondary | ICD-10-CM

## 2015-02-07 DIAGNOSIS — M25551 Pain in right hip: Secondary | ICD-10-CM | POA: Diagnosis present

## 2015-02-07 DIAGNOSIS — R1030 Lower abdominal pain, unspecified: Secondary | ICD-10-CM | POA: Insufficient documentation

## 2015-02-07 DIAGNOSIS — R1031 Right lower quadrant pain: Secondary | ICD-10-CM

## 2015-02-07 DIAGNOSIS — R293 Abnormal posture: Secondary | ICD-10-CM | POA: Insufficient documentation

## 2015-02-07 DIAGNOSIS — R2681 Unsteadiness on feet: Secondary | ICD-10-CM | POA: Diagnosis present

## 2015-02-07 NOTE — Therapy (Signed)
[place], [place], [address] Phone: [phone]   Fax:  [phone]  Physical Therapy Treatment  Patient Details  Name: [place] MRN: [id number] Date of Birth: June 13, 1950 Referring Provider:  Golden Circle, FNP  Encounter Date: 02/07/2015      PT End of Session - 02/07/15 1709    Visit Number 2   Number of Visits 16   Date for PT Re-Evaluation 03/23/15   PT Start Time 0430   PT Stop Time 0500   PT Time Calculation (min) 30 min   Activity Tolerance Patient limited by pain   Behavior During Therapy [facility] for tasks assessed/performed      Past Medical History  Diagnosis Date  . Lung cancer   . Diabetes mellitus without complication   . High cholesterol   . Gout   . Fractured patella   . Low back pain   . Neck pain     Past Surgical History  Procedure Laterality Date  . Back surgery      x4  . Joint replacement Left     Knee  . Cholecystectomy      There were no vitals filed for this visit.  Visit Diagnosis:  Bilateral low back pain without sciatica  Right hip pain  Bilateral groin pain  Abnormal posture  Unsteadiness      Subjective Assessment - 02/07/15 1638    Subjective I havent done the HEP as I was in pain afyter hernia assessment last week and just getting back to walking.    Currently in Pain? Yes   Pain Score 3    Pain Location Knee   Pain Orientation Right;Left   Pain Descriptors / Indicators Aching   Pain Type Chronic pain   Pain Onset More than a month ago   Pain Frequency Constant   Multiple Pain Sites Yes                         [facility] Adult PT Treatment/Exercise - 02/07/15 1650    Exercises   Exercises Knee/Hip   Knee/Hip Exercises: Stretches   Other Knee/Hip Stretches single knee to chest x 1 RT x10 then stopped due to groin pain then x1 20 sec    Knee/Hip Exercises: Aerobic   Nustep L6 6 min   Knee/Hip Exercises: Supine   Bridges 15 reps  cued to exhale    Other Supine Knee/Hip Exercises clamshell with ab set x 12 RT and Lt each.  Then ball squeeze with ab set x 5 stopped due to pain in groin                  PT Short Term Goals - 01/26/15 1123    PT SHORT TERM GOAL #1   Title pt will be I with inital HEP (9/31/2016)   Time 4   Period Weeks   Status New   PT SHORT TERM GOAL #2   Title pt will increase trunk mobility by > 8 degrees in all planes to help with ADLS (7/62/8315)   Time 4   Period Weeks   Status New   PT SHORT TERM GOAL #3   Title He will be able to stand/walk for > 5 minutes with < 6/10 pain to help promote walking and standing endurance (9/31/2016)   Time 4   Period Weeks   Status New   PT SHORT TERM GOAL #4   Title He will  be able to verbalize and demonstrate techniques to reduce reinjury to the low back or hip via postural awareness, lifting and carrying mechanics, and HEP (9/31/2016)   Time 4   Period Weeks   Status New           PT Long Term Goals - 01/26/15 1125    PT LONG TERM GOAL #1   Title At discharge pt will be I with all HEP given throughout therapy  (03/23/2015)   Time 8   Period Weeks   Status New   PT LONG TERM GOAL #2   Title He will demonstrate incrased trunk mobilty by > 10 degrees in all planes to assist with ADLs and safety during driving (94/50/3888)   Time 8   Period Weeks   Status New   PT LONG TERM GOAL #3   Title pt will be able to stand/ walk for > 20 minutes with < 5/10 pain to assist with functional endurance and help with personal goal of be able to go fishing (03/23/2015)   Time 8   Period Weeks   Status New   PT LONG TERM GOAL #4   Title pt will be able to maintain Rhomberg/tandem stance for > 15 seconds with minimal postural sway to promote safety during gait (03/23/2015)   Time 8   Period Weeks   Status New               Plan - 02/07/15 1709    Clinical Impression Statement [name] had pain with the exercies  except LTR but his pain was gone after he sat for 2-3 min at end of session. We will need to be careful to not over do the execises and aggravate his hernia.  This may be a challenge   PT Next Visit Plan assess response to HEP, berg balance, trunk mobility, core strengthening, Hip PROM/AAROM, Modalities PRN   Consulted and Agree with Plan of Care Patient        Problem List Patient Active Problem List   Diagnosis Date Noted  . Hip pain 01/18/2015  . Acute pulmonary embolism 01/01/2015  . DM type 2 (diabetes mellitus, type 2) 01/01/2015  . Gout 01/01/2015  . History of lung cancer 01/01/2015    Darrel Hoover PT 02/07/2015, 5:12 PM  [facility] [address] [place], [place], [address] Phone: [phone]   Fax:  [phone]

## 2015-02-07 NOTE — Patient Instructions (Signed)
Initial HEP was reviewed and he was asked to do these 2-3x before next visit as he did not do them over week end.

## 2015-02-09 ENCOUNTER — Ambulatory Visit: Payer: Medicare Other | Admitting: Physical Therapy

## 2015-02-09 DIAGNOSIS — M545 Low back pain: Secondary | ICD-10-CM | POA: Diagnosis not present

## 2015-02-09 DIAGNOSIS — R2681 Unsteadiness on feet: Secondary | ICD-10-CM

## 2015-02-09 NOTE — Therapy (Signed)
[place], [place], [address] Phone: [phone]   Fax:  [phone]  Physical Therapy Treatment  Patient Details  Name: Harold [place] MRN: [id number] Date of Birth: 1950-10-31 Referring Provider:  Golden Circle, FNP  Encounter Date: 02/09/2015      PT End of Session - 02/09/15 1227    PT Start Time 1019   PT Stop Time 1105   PT Time Calculation (min) 46 min   Activity Tolerance Patient tolerated treatment well   Behavior During Therapy [facility] for tasks assessed/performed      Past Medical History  Diagnosis Date  . Lung cancer   . Diabetes mellitus without complication   . High cholesterol   . Gout   . Fractured patella   . Low back pain   . Neck pain     Past Surgical History  Procedure Laterality Date  . Back surgery      x4  . Joint replacement Left     Knee  . Cholecystectomy      There were no vitals filed for this visit.  Visit Diagnosis:  Unsteadiness      Subjective Assessment - 02/09/15 1236    Subjective 3/10 hips , lower abdomin sore.  He did his exercises he got sore later.                         [facility] Adult PT Treatment/Exercise - 02/09/15 1030    Berg Balance Test   Sit to Stand Able to stand without using hands and stabilize independently   Standing Unsupported Able to stand safely 2 minutes   Sitting with Back Unsupported but Feet Supported on Floor or Stool Able to sit safely and securely 2 minutes   Stand to Sit Controls descent by using hands   Transfers Able to transfer safely, minor use of hands   Standing Unsupported with Eyes Closed Able to stand 10 seconds with supervision   Standing Ubsupported with Feet Together Needs help to attain position and unable to hold for 15 seconds  groin pain increased from 3 to 5/10   From Standing, Reach Forward with Outstretched Arm Reaches forward but needs supervision  This activity gave him a dull  headach   From Standing Position, Pick up Object from Floor Able to pick up shoe, needs supervision   From Standing Position, Turn to Look Behind Over each Shoulder Looks behind one side only/other side shows less weight shift   Turn 360 Degrees Able to turn 360 degrees safely but slowly   Standing Unsupported, Alternately Place Feet on Step/Stool Able to complete >2 steps/needs minimal assist   Standing Unsupported, One Foot in [employer] balance while stepping or standing   Standing on One Leg Able to lift leg independently and hold equal to or more than 3 seconds   Total Score 34   Self-Care   Self-Care --  Suggested patient use walker full time, high fall risk                PT Education - 02/09/15 1223    Education provided Yes   Education Details BEG score significance.  The need to use a walker full time   Person(s) Educated Patient   Methods Explanation   Comprehension Other (comment)  Patient declined use of walker due to not being able to bear weight on arms because of his neck pain  PT Short Term Goals - 02/09/15 1238    PT SHORT TERM GOAL #1   Title pt will be I with inital HEP (9/31/2016)   Baseline Still making him. sore   Time 4   Period Weeks   Status On-going   PT SHORT TERM GOAL #2   Title pt will increase trunk mobility by > 8 degrees in all planes to help with ADLS (08/02/6576)   Time 4   Period Weeks   Status On-going   PT SHORT TERM GOAL #3   Title He will be able to stand/walk for > 5 minutes with < 6/10 pain to help promote walking and standing endurance (9/31/2016)   Time 4   Status Unable to assess   PT SHORT TERM GOAL #4   Title He will be able to verbalize and demonstrate techniques to reduce reinjury to the low back or hip via postural awareness, lifting and carrying mechanics, and HEP (9/31/2016)   Time 4   Period Weeks   Status Unable to assess           PT Long Term Goals - 01/26/15 1125    PT LONG TERM GOAL #1    Title At discharge pt will be I with all HEP given throughout therapy  (03/23/2015)   Time 8   Period Weeks   Status New   PT LONG TERM GOAL #2   Title He will demonstrate incrased trunk mobilty by > 10 degrees in all planes to assist with ADLs and safety during driving (46/96/2952)   Time 8   Period Weeks   Status New   PT LONG TERM GOAL #3   Title pt will be able to stand/ walk for > 20 minutes with < 5/10 pain to assist with functional endurance and help with personal goal of be able to go fishing (03/23/2015)   Time 8   Period Weeks   Status New   PT LONG TERM GOAL #4   Title pt will be able to maintain Rhomberg/tandem stance for > 15 seconds with minimal postural sway to promote safety during gait (03/23/2015)   Time 8   Period Weeks   Status New               Plan - 02/09/15 1228    Clinical Impression Statement pain 3.5 at end of session.  Extra time required for BERG due to patient's conditions.  [name] PT informed about score and suggested walker full time.  Patient declined.    PT Next Visit Plan  trunk mobility, core strengthening, Hip PROM/AAROM, Modalities PRN   PT Home Exercise Plan continue home exercises, let pain be your guide.   Consulted and Agree with Plan of Care Patient        Problem List Patient Active Problem List   Diagnosis Date Noted  . Hip pain 01/18/2015  . Acute pulmonary embolism 01/01/2015  . DM type 2 (diabetes mellitus, type 2) 01/01/2015  . Gout 01/01/2015  . History of lung cancer 01/01/2015    [facility] 02/09/2015, 12:50 PM  [facility] [address] [place], [place], [address] Phone: [phone]   Fax:  [phone]     Melvenia Needles, PTA 02/09/2015 12:50 PM Phone: [phone] Fax: [phone]

## 2015-02-14 ENCOUNTER — Ambulatory Visit: Payer: Medicare Other | Admitting: Physical Therapy

## 2015-02-14 DIAGNOSIS — R293 Abnormal posture: Secondary | ICD-10-CM

## 2015-02-14 DIAGNOSIS — M25551 Pain in right hip: Secondary | ICD-10-CM

## 2015-02-14 DIAGNOSIS — M545 Low back pain, unspecified: Secondary | ICD-10-CM

## 2015-02-14 DIAGNOSIS — R1032 Left lower quadrant pain: Secondary | ICD-10-CM

## 2015-02-14 DIAGNOSIS — R1031 Right lower quadrant pain: Secondary | ICD-10-CM

## 2015-02-14 DIAGNOSIS — R2681 Unsteadiness on feet: Secondary | ICD-10-CM

## 2015-02-14 NOTE — Therapy (Signed)
[place], [place], [address] Phone: [phone]   Fax:  [phone]  Physical Therapy Treatment  Patient Details  Name: Harold Morris MRN: [id number] Date of Birth: 1950/08/23 Referring Provider:  Golden Circle, FNP  Encounter Date: 02/14/2015      PT End of Session - 02/14/15 1612    Visit Number 5   Number of Visits 16   Date for PT Re-Evaluation 03/23/15   PT Start Time 8841   PT Stop Time 1505   PT Time Calculation (min) 50 min   Activity Tolerance Patient tolerated treatment well   Behavior During Therapy [facility] for tasks assessed/performed      Past Medical History  Diagnosis Date  . Lung cancer   . Diabetes mellitus without complication   . High cholesterol   . Gout   . Fractured patella   . Low back pain   . Neck pain     Past Surgical History  Procedure Laterality Date  . Back surgery      x4  . Joint replacement Left     Knee  . Cholecystectomy      There were no vitals filed for this visit.  Visit Diagnosis:  Unsteadiness  Bilateral low back pain without sciatica  Right hip pain  Bilateral groin pain  Abnormal posture      Subjective Assessment - 02/14/15 1418    Subjective "some days I am able to walk a little further than others"   Currently in Pain? Yes   Pain Score 3    Pain Location Back   Pain Orientation Right;Left   Pain Descriptors / Indicators Aching   Pain Type Chronic pain   Pain Onset More than a month ago   Pain Frequency Constant   Pain Score 3   Pain Location Hip   Pain Orientation Right   Pain Descriptors / Indicators Aching;Dull   Pain Type Chronic pain   Pain Onset More than a month ago   Pain Frequency Intermittent   Aggravating Factors  walking, standing                         [facility] Adult PT Treatment/Exercise - 02/14/15 0001    Self-Care   Self-Care Posture   Posture posture education and training on how to  maintain proper spinal curves.   Lumbar Exercises: Stretches   Active Hamstring Stretch 2 reps;30 seconds   Lower Trunk Rotation 3 reps;30 seconds   Pelvic Tilt 3 reps;30 seconds   Piriformis Stretch 2 reps;30 seconds   Lumbar Exercises: Supine   Other Supine Lumbar Exercises decompression exercise with physioball   Modalities   Modalities Cryotherapy   Cryotherapy   Number Minutes Cryotherapy 15 Minutes   Cryotherapy Location Lumbar Spine   Type of Cryotherapy Ice pack  in supine   Manual Therapy   Manual Therapy Joint mobilization;Myofascial release   Joint Mobilization long axis distraction grade 2-3 4 x 30 sec bil   Myofascial Release manual trigger point release of bil parapsinals , glute mediius/ maximus and pirifomrmis.                 PT Education - 02/14/15 1611    Education provided Yes   Education Details posture education   Person(s) Educated Patient   Methods Explanation   Comprehension Verbalized understanding          PT Short Term Goals - 02/09/15 1238  PT SHORT TERM GOAL #1   Title pt will be I with inital HEP (9/31/2016)   Baseline Still making him. sore   Time 4   Period Weeks   Status On-going   PT SHORT TERM GOAL #2   Title pt will increase trunk mobility by > 8 degrees in all planes to help with ADLS (01/23/9370)   Time 4   Period Weeks   Status On-going   PT SHORT TERM GOAL #3   Title He will be able to stand/walk for > 5 minutes with < 6/10 pain to help promote walking and standing endurance (9/31/2016)   Time 4   Status Unable to assess   PT SHORT TERM GOAL #4   Title He will be able to verbalize and demonstrate techniques to reduce reinjury to the low back or hip via postural awareness, lifting and carrying mechanics, and HEP (9/31/2016)   Time 4   Period Weeks   Status Unable to assess           PT Long Term Goals - 01/26/15 1125    PT LONG TERM GOAL #1   Title At discharge pt will be I with all HEP given throughout  therapy  (03/23/2015)   Time 8   Period Weeks   Status New   PT LONG TERM GOAL #2   Title He will demonstrate incrased trunk mobilty by > 10 degrees in all planes to assist with ADLs and safety during driving (69/67/8938)   Time 8   Period Weeks   Status New   PT LONG TERM GOAL #3   Title pt will be able to stand/ walk for > 20 minutes with < 5/10 pain to assist with functional endurance and help with personal goal of be able to go fishing (03/23/2015)   Time 8   Period Weeks   Status New   PT LONG TERM GOAL #4   Title pt will be able to maintain Rhomberg/tandem stance for > 15 seconds with minimal postural sway to promote safety during gait (03/23/2015)   Time 8   Period Weeks   Status New               Plan - 02/14/15 1612    Clinical Impression Statement Mr. Coles reports increased  pain in the low back. He was able to complete the exercises with report of pain in the groin, intermittent low back and intermittent referral to the feet. performed manual trigger point release which with gradual pressure he reported decreaesd pain. following treatment put pt on ice per pt request and follwoing treatment pt rpeorted feeling "stiff" and walked out with an antalalgic gait pattern.    PT Next Visit Plan  trunk mobility, core strengthening, Hip PROM/AAROM, Modalities PRN (try heat)   PT Home Exercise Plan HEP review   Consulted and Agree with Plan of Care Patient        Problem List Patient Active Problem List   Diagnosis Date Noted  . Hip pain 01/18/2015  . Acute pulmonary embolism 01/01/2015  . DM type 2 (diabetes mellitus, type 2) 01/01/2015  . Gout 01/01/2015  . History of lung cancer 01/01/2015   Starr Lake PT, DPT, LAT, ATC  02/14/2015  4:20 PM      [facility] [address] [place], [place], [address] Phone: [phone]   Fax:  [phone]

## 2015-02-15 ENCOUNTER — Other Ambulatory Visit: Payer: Self-pay | Admitting: Family

## 2015-02-16 ENCOUNTER — Ambulatory Visit (INDEPENDENT_AMBULATORY_CARE_PROVIDER_SITE_OTHER): Payer: Medicare Other | Admitting: Pulmonary Disease

## 2015-02-16 ENCOUNTER — Encounter: Payer: Self-pay | Admitting: Pulmonary Disease

## 2015-02-16 ENCOUNTER — Other Ambulatory Visit: Payer: Non-veteran care

## 2015-02-16 VITALS — BP 132/88 | HR 80 | Ht 69.0 in | Wt 241.4 lb

## 2015-02-16 DIAGNOSIS — I2699 Other pulmonary embolism without acute cor pulmonale: Secondary | ICD-10-CM | POA: Diagnosis not present

## 2015-02-16 DIAGNOSIS — Z23 Encounter for immunization: Secondary | ICD-10-CM | POA: Diagnosis not present

## 2015-02-16 NOTE — Patient Instructions (Signed)
Give blood for tests. Pulmonary function test.   Return to clinic in 6 months.

## 2015-02-16 NOTE — Addendum Note (Signed)
Addended by: Jerrol Banana on: 02/16/2015 04:45 PM   Modules accepted: Orders

## 2015-02-16 NOTE — Progress Notes (Signed)
Subjective:    Patient ID: ARIANNA DELSANTO, male    DOB: January 21, 1951, 64 y.o.   MRN: [id number]  HPI   Consult for management of recent diagnosis of PE  Mr. Reas is a 64 year old male who gets most of his care at the [facility]. He has history of lung cancer diagnosed at 2010 at the [place]. This was treated with the left lower lobectomy and chemotherapy in 2011. As per the patient yesterday to follow-up at the [place] and no apparent recurrence of the cancer.  He has been complaining of worsening dyspnea ever since his lung resection. This has worsened more acutely last month. He was admitted on 8516 with a diagnosis of of right-sided PE. There is evidence of mild RV strain with a RV/LV ratio of 1:1. There is mild dilation of RV on echocardiogram but Troponin levels were normal. There is no evidence of DVT. There are no precipitating reasons for PE. No recent travel immobilization or surgery. There is no family history of clots or hypercoagulable state.  H/O smoking. 1ppd for 12 years. Quit in 1985.  Past Medical History  Diagnosis Date  . Lung cancer   . Diabetes mellitus without complication   . High cholesterol   . Gout   . Fractured patella   . Low back pain   . Neck pain   . Pulmonary embolism      Current outpatient prescriptions:  .  acarbose (PRECOSE) 25 MG tablet, TAKE 1 TABLET (25 MG TOTAL) BY MOUTH 3 (THREE) TIMES DAILY WITH MEALS., Disp: 90 tablet, Rfl: 0 .  allopurinol (ZYLOPRIM) 300 MG tablet, Take 300 mg by mouth daily., Disp: , Rfl:  .  atorvastatin (LIPITOR) 80 MG tablet, Take 80 mg by mouth daily., Disp: , Rfl:  .  Cyanocobalamin (VITAMIN B-12 CR) 1000 MCG TBCR, Take 1,000 mg by mouth daily., Disp: , Rfl:  .  docusate sodium (COLACE) 100 MG capsule, Take 100 mg by mouth 2 (two) times daily., Disp: , Rfl:  .  ERGOCALCIFEROL PO, Take 1 tablet by mouth daily., Disp: , Rfl:  .  fenofibrate (TRICOR) 145 MG tablet, Take 145 mg by mouth daily., Disp: , Rfl:  .   gabapentin (NEURONTIN) 600 MG tablet, Take 600-900 mg by mouth See admin instructions. Takes 2 caps in am and 3 caps at bedtime, Disp: , Rfl:  .  HYDROcodone-acetaminophen (NORCO) 10-325 MG per tablet, Take 1 tablet by mouth every 6 (six) hours as needed for moderate pain., Disp: , Rfl:  .  magnesium oxide (MAG-OX) 400 MG tablet, Take 400 mg by mouth 2 (two) times daily., Disp: , Rfl:  .  meclizine (ANTIVERT) 25 MG tablet, Take 25 mg by mouth daily as needed for dizziness. , Disp: , Rfl:  .  methocarbamol (ROBAXIN) 500 MG tablet, Take 1 tablet (500 mg total) by mouth 2 (two) times daily as needed for muscle spasms., Disp: 20 tablet, Rfl: 0 .  omeprazole (PRILOSEC) 20 MG capsule, Take 20 mg by mouth daily., Disp: , Rfl:  .  Rivaroxaban (XARELTO STARTER PACK) 15 & 20 MG TBPK, Take as directed on package: Start with one '15mg'$  tablet by mouth twice a day with food. On Day 22, switch to one '20mg'$  tablet once a day with food., Disp: 51 each, Rfl: 0 .  Rivaroxaban (XARELTO) 15 MG TABS tablet, Take 15 mg by mouth 2 (two) times daily with a meal., Disp: , Rfl:  .  sertraline (ZOLOFT) 100 MG tablet,  Take 100 mg by mouth daily., Disp: , Rfl:  .  triamcinolone cream (KENALOG) 0.1 %, Apply 1 application topically 2 (two) times daily., Disp: , Rfl:   Review of Systems  Constitutional: Positive for unexpected weight change. Negative for fever.  HENT: Positive for congestion and sinus pressure. Negative for dental problem, ear pain, nosebleeds, postnasal drip, rhinorrhea, sneezing, sore throat and trouble swallowing.   Eyes: Negative for redness and itching.  Respiratory: Positive for shortness of breath. Negative for cough, chest tightness and wheezing.   Cardiovascular: Positive for palpitations. Negative for leg swelling.  Gastrointestinal: Negative for nausea and vomiting.  Genitourinary: Negative for dysuria.  Musculoskeletal: Negative for joint swelling.  Skin: Negative for rash.  Neurological: Positive for  headaches.  Hematological: Does not bruise/bleed easily.  Psychiatric/Behavioral: Negative for dysphoric mood. The patient is not nervous/anxious.    Blood pressure 132/88, pulse 80, height '5\' 9"'$  (1.753 m), weight 241 lb 6.4 oz (109.498 kg), SpO2 95 %.     Objective:   Physical Exam  Constitutional: He is oriented to person, place, and time. He appears well-developed and well-nourished.  HENT:  Mouth/Throat: Oropharynx is clear and moist.  Eyes: Pupils are equal, round, and reactive to light.  Neck: Normal range of motion. Neck supple.  Cardiovascular: Normal rate and regular rhythm.   Pulmonary/Chest: Effort normal and breath sounds normal.  Abdominal: Soft. Bowel sounds are normal.  Neurological: He is alert and oriented to person, place, and time.  Skin: Skin is warm and dry.      Assessment & Plan:   Right-sided PE.  This is apparently unprovoked. Although he has history of lung cancer, this has been resected with no evidence of recurrence. He would need anticoagulation for at least 6-9 months. We discussed the risk benefits of lifelong anticoagulation. He could be considered a candidate for that in this case of unprovoked PE. But he has risk factors for bleeding including age, diabetes mellitus, unsteadiness of gait, falls. In addition he gets frequent injections in the neck for chronic pain that will have to be held when he is on Xarelto.  Plan: -Continue anticoagulation for 6-9 months.  -Hypercoagulable panel. -Pulmonary function tests  Return to clinic in 6 months.  Marshell Garfinkel MD [facility] Pager [phone] If no answer or after 3pm call: [phone] 02/16/2015, 2:45 PM

## 2015-02-17 ENCOUNTER — Ambulatory Visit: Payer: Medicare Other | Admitting: Physical Therapy

## 2015-02-17 DIAGNOSIS — R293 Abnormal posture: Secondary | ICD-10-CM

## 2015-02-17 DIAGNOSIS — R2681 Unsteadiness on feet: Secondary | ICD-10-CM

## 2015-02-17 DIAGNOSIS — M545 Low back pain, unspecified: Secondary | ICD-10-CM

## 2015-02-17 DIAGNOSIS — M25551 Pain in right hip: Secondary | ICD-10-CM

## 2015-02-17 DIAGNOSIS — R1031 Right lower quadrant pain: Secondary | ICD-10-CM

## 2015-02-17 DIAGNOSIS — R1032 Left lower quadrant pain: Secondary | ICD-10-CM

## 2015-02-17 NOTE — Therapy (Addendum)
[place], [place], [address] Phone: [phone]   Fax:  [phone]  Physical Therapy Treatment  Patient Details  Name: Harold Morris MRN: [id number] Date of Birth: 17-Mar-1951 Referring Provider:  Golden Circle, FNP  Encounter Date: 02/17/2015      PT End of Session - 02/17/15 1557    Visit Number 6   Number of Visits 16   Date for PT Re-Evaluation 03/23/15   PT Start Time 1505   PT Stop Time 1545   PT Time Calculation (min) 40 min   Activity Tolerance Patient tolerated treatment well;Patient limited by pain   Behavior During Therapy [facility] for tasks assessed/performed      Past Medical History  Diagnosis Date  . Lung cancer   . Diabetes mellitus without complication   . High cholesterol   . Gout   . Fractured patella   . Low back pain   . Neck pain   . Pulmonary embolism     Past Surgical History  Procedure Laterality Date  . Back surgery      x4  . Joint replacement Left     Knee  . Cholecystectomy    . Lung cancer surgery Left 06/2009    There were no vitals filed for this visit.  Visit Diagnosis:  Unsteadiness  Bilateral low back pain without sciatica  Right hip pain  Bilateral groin pain  Abnormal posture      Subjective Assessment - 02/17/15 1544    Subjective 4/10 painful  Neck shoulders body,  Patient has  not seen progress in pain or function since starting PT .                           [facility] Adult PT Treatment/Exercise - 02/17/15 1505    Self-Care   Self-Care --  ADL handout briefly reviewed sleeping posture, ADL's   Lumbar Exercises: Stretches   Double Knee to Chest Stretch 5 reps   Double Knee to Chest Stretch Limitations legs on ball, Min assist for safety   Lower Trunk Rotation 5 reps   Lower Trunk Rotation Limitations legs on red ball   Lumbar Exercises: Supine   Bent Knee Raise 5 reps   Bent Knee Raise Limitations aa lifting from  red ball with LT,  RT Active, cued to keep low bach neutral.     Other Supine Lumbar Exercises Hooklying isometric ball squeeze.  10 X 5 second holds.   Moist Heat Therapy   Number Minutes Moist Heat 15 Minutes   Moist Heat Location --  low back/hip RT in sidelying.                PT Education - 02/17/15 1556    Education provided Yes   Education Details ADL handout, and Not to do exercises on floor, OK to do on bed.   Person(s) Educated Patient   Methods Explanation;Handout   Comprehension Verbalized understanding          PT Short Term Goals - 02/17/15 1600    PT SHORT TERM GOAL #1   Title pt will be I with inital HEP (9/31/2016)   Time 4   Period Weeks   Status Achieved   PT SHORT TERM GOAL #2   Title pt will increase trunk mobility by > 8 degrees in all planes to help with ADLS (9/41/7408)   Baseline too painful to test   Time 4  Period Weeks   Status Unable to assess   PT SHORT TERM GOAL #3   Baseline walks 300 feet to church with several rests pain 10/10 (Last Sunday)   Time 4   Period Weeks   Status Not Met   PT SHORT TERM GOAL #4   Title He will be able to verbalize and demonstrate techniques to reduce reinjury to the low back or hip via postural awareness, lifting and carrying mechanics, and HEP (9/31/2016)   Baseline Verbalizes understanding of lifting and posture techniques.     Time 4   Period Weeks   Status Partially Met           PT Long Term Goals - 02/17/15 1601    PT LONG TERM GOAL #1   Title At discharge pt will be I with all HEP given throughout therapy  (03/23/2015)   Baseline independent   Time 8   Status Achieved   PT LONG TERM GOAL #2   Title He will demonstrate incrased trunk mobilty by > 10 degrees in all planes to assist with ADLs and safety during driving (17/51/0258)   Baseline too painful to test   Time 8   Period Weeks   Status Unable to assess   PT LONG TERM GOAL #3   Title pt will be able to stand/ walk for > 20  minutes with < 5/10 pain to assist with functional endurance and help with personal goal of be able to go fishing (03/23/2015)   Time 8   Period Weeks   Status Not Met   PT LONG TERM GOAL #4   Title pt will be able to maintain Rhomberg/tandem stance for > 15 seconds with minimal postural sway to promote safety during gait (03/23/2015)   Time 8   Period Weeks   Status Unable to assess               Plan - 02/17/15 1558    Clinical Impression Statement WOMAC 77/96 = 80.21 %.  After checking goals discussed lack of progress with patient and he decided on being discharged.  OK'd with PT Vania Rea.     PT Next Visit Plan Discharge   Consulted and Agree with Plan of Care Patient        Problem List Patient Active Problem List   Diagnosis Date Noted  . Hip pain 01/18/2015  . Acute pulmonary embolism 01/01/2015  . DM type 2 (diabetes mellitus, type 2) 01/01/2015  . Gout 01/01/2015  . History of lung cancer 01/01/2015    [facility] 02/17/2015, 4:05 PM  [facility] [address] [place], [place], [address] Phone: [phone]   Fax:  [phone]     Melvenia Needles, PTA 02/17/2015 4:05 PM Phone: [phone] Fax: [phone]    PHYSICAL THERAPY DISCHARGE SUMMARY  Visits from Start of Care: 6  Current functional level related to goals / functional outcomes: See goals  Remaining deficits: Pain in the back/hip/ groin, limited AROM, decreased strength.   Education / Equipment: HEP  Plan: Patient agrees to discharge.  Patient goals were not met. Patient is being discharged due to the patient's request.  ?????         Starr Lake PT, DPT, LAT, ATC  02/17/2015  5:57 PM

## 2015-02-17 NOTE — Patient Instructions (Addendum)
Do not do exercises on floor . Informed he could do on bed.     Sleeping on Back  Place pillow under knees. A pillow with cervical support and a roll around waist are also helpful. Copyright  VHI. All rights reserved.  Sleeping on Side Place pillow between knees. Use cervical support under neck and a roll around waist as needed. Copyright  VHI. All rights reserved.   Sleeping on Stomach   If this is the only desirable sleeping position, place pillow under lower legs, and under stomach or chest as needed.  Posture - Sitting   Sit upright, head facing forward. Try using a roll to support lower back. Keep shoulders relaxed, and avoid rounded back. Keep hips level with knees. Avoid crossing legs for long periods. Stand to Sit / Sit to Stand   To sit: Bend knees to lower self onto front edge of chair, then scoot back on seat. To stand: Reverse sequence by placing one foot forward, and scoot to front of seat. Use rocking motion to stand up.   Work Height and Reach  Ideal work height is no more than 2 to 4 inches below elbow level when standing, and at elbow level when sitting. Reaching should be limited to arm's length, with elbows slightly bent.  Bending  Bend at hips and knees, not back. Keep feet shoulder-width apart.    Posture - Standing   Good posture is important. Avoid slouching and forward head thrust. Maintain curve in low back and align ears over shoul- ders, hips over ankles.  Alternating Positions   Alternate tasks and change positions frequently to reduce fatigue and muscle tension. Take rest breaks. Computer Work   Position work to Programmer, multimedia. Use proper work and seat height. Keep shoulders back and down, wrists straight, and elbows at right angles. Use chair that provides full back support. Add footrest and lumbar roll as needed.  Getting Into / Out of Car  Lower self onto seat, scoot back, then bring in one leg at a time. Reverse sequence to get  out.  Dressing  Lie on back to pull socks or slacks over feet, or sit and bend leg while keeping back straight.    Housework - Sink  Place one foot on ledge of cabinet under sink when standing at sink for prolonged periods.   Pushing / Pulling  Pushing is preferable to pulling. Keep back in proper alignment, and use leg muscles to do the work.  Deep Squat   Squat and lift with both arms held against upper trunk. Tighten stomach muscles without holding breath. Use smooth movements to avoid jerking.  Avoid Twisting   Avoid twisting or bending back. Pivot around using foot movements, and bend at knees if needed when reaching for articles.  Carrying Luggage   Distribute weight evenly on both sides. Use a cart whenever possible. Do not twist trunk. Move body as a unit.   Lifting Principles .Maintain proper posture and head alignment. .Slide object as close as possible before lifting. .Move obstacles out of the way. .Test before lifting; ask for help if too heavy. .Tighten stomach muscles without holding breath. .Use smooth movements; do not jerk. .Use legs to do the work, and pivot with feet. .Distribute the work load symmetrically and close to the center of trunk. .Push instead of pull whenever possible.   Ask For Help   Ask for help and delegate to others when possible. Coordinate your movements when lifting together, and maintain the  low back curve.  Log Roll   Lying on back, bend left knee and place left arm across chest. Roll all in one movement to the right. Reverse to roll to the left. Always move as one unit. Housework - Sweeping  Use long-handled equipment to avoid stooping.   Housework - Wiping  Position yourself as close as possible to reach work surface. Avoid straining your back.  Laundry - Unloading Wash   To unload small items at bottom of washer, lift leg opposite to arm being used to reach.  Lyle close to area to be  raked. Use arm movements to do the work. Keep back straight and avoid twisting.     Cart  When reaching into cart with one arm, lift opposite leg to keep back straight.   Getting Into / Out of Bed  Lower self to lie down on one side by raising legs and lowering head at the same time. Use arms to assist moving without twisting. Bend both knees to roll onto back if desired. To sit up, start from lying on side, and use same move-ments in reverse. Housework - Vacuuming  Hold the vacuum with arm held at side. Step back and forth to move it, keeping head up. Avoid twisting.   Laundry - IT consultant so that bending and twisting can be avoided.   Laundry - Unloading Dryer  Squat down to reach into clothes dryer or use a reacher.  Gardening - Weeding / Probation officer or Kneel. Knee pads may be helpful.

## 2015-02-18 ENCOUNTER — Encounter: Payer: Self-pay | Admitting: Family

## 2015-02-18 ENCOUNTER — Ambulatory Visit (INDEPENDENT_AMBULATORY_CARE_PROVIDER_SITE_OTHER): Payer: Medicare Other | Admitting: Family

## 2015-02-18 VITALS — BP 110/64 | HR 64 | Temp 98.8°F | Ht 69.0 in | Wt 244.5 lb

## 2015-02-18 DIAGNOSIS — M25551 Pain in right hip: Secondary | ICD-10-CM

## 2015-02-18 DIAGNOSIS — K529 Noninfective gastroenteritis and colitis, unspecified: Secondary | ICD-10-CM | POA: Diagnosis not present

## 2015-02-18 DIAGNOSIS — E119 Type 2 diabetes mellitus without complications: Secondary | ICD-10-CM

## 2015-02-18 MED ORDER — ELUXADOLINE 75 MG PO TABS: 75.0000 mg | ORAL_TABLET | Freq: Two times a day (BID) | ORAL | Status: DC

## 2015-02-18 NOTE — Assessment & Plan Note (Signed)
Symptoms of chronic diarrhea that has been previously evaluated and had work up through the [facility]. Will trial Viberzi. Follow up if symptoms do not improve with medication.

## 2015-02-18 NOTE — Progress Notes (Signed)
Pre visit review using our clinic review tool, if applicable. No additional management support is needed unless otherwise documented below in the visit note. 

## 2015-02-18 NOTE — Assessment & Plan Note (Signed)
Continues to experience hip pain at about the same level as prior to physical therapy. Continue home exercise therapy. Refer to orthopedics for further evaluation. Currently receiving pain medication from the [facility]. Follow up after orthopedic appointment or sooner if needed.

## 2015-02-18 NOTE — Assessment & Plan Note (Signed)
Appears stable with current regimen and blood sugar average at home 130-140. Continue current dosage of acarbose. Follow up in 2 months for A1c.

## 2015-02-18 NOTE — Patient Instructions (Addendum)
Thank you for choosing [facility].  Summary/Instructions:  Please continue to take your medications as prescribed.   Your prescription(s) have been submitted to your pharmacy or been printed and provided for you. Please take as directed and contact our office if you believe you are having problem(s) with the medication(s) or have any questions.  Referrals have been made during this visit. You should expect to hear back from our schedulers in about 7-10 days in regards to establishing an appointment with the specialists we discussed.   If your symptoms worsen or fail to improve, please contact our office for further instruction, or in case of emergency go directly to the emergency room at the closest medical facility.

## 2015-02-18 NOTE — Progress Notes (Signed)
Subjective:    Patient ID: Harold Morris, male    DOB: December 31, 1950, 64 y.o.   MRN: [id number]   Chief Complaint  Patient presents with  . Hip Pain  . Diarrhea  . Diabetes    HPI:  Harold Morris is a 65 y.o. male who  has a past medical history of Lung cancer; Diabetes mellitus without complication; High cholesterol; Gout; Fractured patella; Low back pain; Neck pain; and Pulmonary embolism. and presents today for an office follow up.   1.) Right hip pain -/ Low back pain -  Continues to experience the associated symptom of pain located in his right hip and lower back. Has recently been discharged from physical therapy with a home exercise program and notes that he has not seen significant improvements.   2.) Type 2 diabetes - Recently changed from metformin to acarbose. Has noted increased amount of bloating and gas since starting the medication. Reports that his blood sugars at home are ranging around 140-150. Reports that his symptoms are mild and tolerable. Takes the medications as prescribed.  Lab Results  Component Value Date   HGBA1C 7.1* 01/01/2015    3.) Diarrhea - Indicates that he has had diarrhea since he was in [place]. Has tried multiple medications through the [facility]. Currently goes about 4 times per day depending up how much walking he does and can go as much as 12 times per day with loose stools. Notes that he has had stool testing previously performed and no abnormalities can be found.   Allergies  Allergen Reactions  . Epinephrine Anaphylaxis  . Sulfa Antibiotics Other (See Comments)    unknown  . Oxycontin [Oxycodone Hcl]   . Morphine Other (See Comments)    hallucinations     Current Outpatient Prescriptions on File Prior to Visit  Medication Sig Dispense Refill  . acarbose (PRECOSE) 25 MG tablet TAKE 1 TABLET (25 MG TOTAL) BY MOUTH 3 (THREE) TIMES DAILY WITH MEALS. 90 tablet 0  . allopurinol (ZYLOPRIM) 300 MG tablet Take 300 mg by mouth  daily.    Marland Kitchen atorvastatin (LIPITOR) 80 MG tablet Take 80 mg by mouth daily.    . Cyanocobalamin (VITAMIN B-12 CR) 1000 MCG TBCR Take 1,000 mg by mouth daily.    Marland Kitchen docusate sodium (COLACE) 100 MG capsule Take 100 mg by mouth 2 (two) times daily.    . ERGOCALCIFEROL PO Take 1 tablet by mouth daily.    . fenofibrate (TRICOR) 145 MG tablet Take 145 mg by mouth daily.    Marland Kitchen gabapentin (NEURONTIN) 600 MG tablet Take 600-900 mg by mouth See admin instructions. Takes 2 caps in am and 3 caps at bedtime    . HYDROcodone-acetaminophen (NORCO) 10-325 MG per tablet Take 1 tablet by mouth every 6 (six) hours as needed for moderate pain.    . magnesium oxide (MAG-OX) 400 MG tablet Take 400 mg by mouth 2 (two) times daily.    . meclizine (ANTIVERT) 25 MG tablet Take 25 mg by mouth daily as needed for dizziness.     . methocarbamol (ROBAXIN) 500 MG tablet Take 1 tablet (500 mg total) by mouth 2 (two) times daily as needed for muscle spasms. 20 tablet 0  . omeprazole (PRILOSEC) 20 MG capsule Take 20 mg by mouth daily.    . Rivaroxaban (XARELTO) 15 MG TABS tablet Take 15 mg by mouth daily.     . sertraline (ZOLOFT) 100 MG tablet Take 100 mg by mouth daily.    Marland Kitchen  triamcinolone cream (KENALOG) 0.1 % Apply 1 application topically 2 (two) times daily.     No current facility-administered medications on file prior to visit.    Past Medical History  Diagnosis Date  . Lung cancer   . Diabetes mellitus without complication   . High cholesterol   . Gout   . Fractured patella   . Low back pain   . Neck pain   . Pulmonary embolism     Past Surgical History  Procedure Laterality Date  . Back surgery      x4  . Joint replacement Left     Knee  . Cholecystectomy    . Lung cancer surgery Left 06/2009     Review of Systems  Constitutional: Negative for fever and chills.  Respiratory: Negative for chest tightness and shortness of breath.   Cardiovascular: Negative for chest pain, palpitations and leg swelling.    Gastrointestinal: Positive for diarrhea.  Endocrine: Negative for polydipsia, polyphagia and polyuria.  Musculoskeletal: Positive for back pain and arthralgias.      Objective:    BP 110/64 mmHg  Pulse 64  Temp(Src) 98.8 F (37.1 C) (Oral)  Ht '5\' 9"'$  (1.753 m)  Wt 244 lb 8 oz (110.904 kg)  BMI 36.09 kg/m2  SpO2 97% Nursing note and vital signs reviewed.  Physical Exam  Constitutional: He is oriented to person, place, and time. He appears well-developed and well-nourished. No distress.  Cardiovascular: Normal rate, regular rhythm, normal heart sounds and intact distal pulses.   Pulmonary/Chest: Effort normal and breath sounds normal.  Musculoskeletal:  Right hip - no obvious deformity, discoloration, or edema noted. No palpable tenderness able to be elicited upon examination. Range of motion is similar to the contralateral side. Distal pulses and sensation are intact and appropriate. Negative hip grinding.   Neurological: He is alert and oriented to person, place, and time.  Skin: Skin is warm and dry.  Psychiatric: He has a normal mood and affect. His behavior is normal. Judgment and thought content normal.       Assessment & Plan:   Problem List Items Addressed This Visit      Digestive   Chronic diarrhea    Symptoms of chronic diarrhea that has been previously evaluated and had work up through the [facility]. Will trial Viberzi. Follow up if symptoms do not improve with medication.         Endocrine   DM type 2 (diabetes mellitus, type 2) (Chronic)    Appears stable with current regimen and blood sugar average at home 130-140. Continue current dosage of acarbose. Follow up in 2 months for A1c.         Other   Hip pain - Primary    Continues to experience hip pain at about the same level as prior to physical therapy. Continue home exercise therapy. Refer to orthopedics for further evaluation. Currently receiving pain medication from the [facility]. Follow up after orthopedic appointment or sooner if needed.       Relevant Orders   AMB referral to orthopedics

## 2015-02-21 LAB — PROTEIN C, TOTAL: Protein C Antigen: 139 % (ref 70–140)

## 2015-02-21 LAB — PROTEIN S, TOTAL: PROTEIN S ANTIGEN, TOTAL: 156 % — AB (ref 70–140)

## 2015-02-21 LAB — PROTEIN S ACTIVITY: PROTEIN S ACTIVITY: 138 % (ref 70–150)

## 2015-02-21 LAB — PROTEIN C ACTIVITY

## 2015-02-22 ENCOUNTER — Ambulatory Visit: Payer: Medicare Other | Admitting: Physical Therapy

## 2015-02-24 ENCOUNTER — Encounter: Payer: Medicare Other | Admitting: Physical Therapy

## 2015-02-28 ENCOUNTER — Emergency Department (HOSPITAL_COMMUNITY)
Admission: EM | Admit: 2015-02-28 | Discharge: 2015-02-28 | Disposition: A | Discharge status: Home / Self Care | Payer: Medicare Other | Attending: Emergency Medicine | Admitting: Emergency Medicine

## 2015-02-28 ENCOUNTER — Encounter (HOSPITAL_COMMUNITY): Payer: Self-pay | Admitting: *Deleted

## 2015-02-28 ENCOUNTER — Telehealth: Payer: Self-pay | Admitting: Family

## 2015-02-28 DIAGNOSIS — E782 Mixed hyperlipidemia: Secondary | ICD-10-CM | POA: Diagnosis not present

## 2015-02-28 DIAGNOSIS — Z87891 Personal history of nicotine dependence: Secondary | ICD-10-CM | POA: Diagnosis not present

## 2015-02-28 DIAGNOSIS — E1165 Type 2 diabetes mellitus with hyperglycemia: Secondary | ICD-10-CM | POA: Diagnosis present

## 2015-02-28 DIAGNOSIS — Z79899 Other long term (current) drug therapy: Secondary | ICD-10-CM | POA: Diagnosis not present

## 2015-02-28 DIAGNOSIS — R42 Dizziness and giddiness: Secondary | ICD-10-CM | POA: Insufficient documentation

## 2015-02-28 DIAGNOSIS — M109 Gout, unspecified: Secondary | ICD-10-CM | POA: Insufficient documentation

## 2015-02-28 DIAGNOSIS — Z86711 Personal history of pulmonary embolism: Secondary | ICD-10-CM | POA: Diagnosis not present

## 2015-02-28 DIAGNOSIS — Z85118 Personal history of other malignant neoplasm of bronchus and lung: Secondary | ICD-10-CM | POA: Diagnosis not present

## 2015-02-28 LAB — I-STAT TROPONIN, ED: TROPONIN I, POC: 0 ng/mL (ref 0.00–0.08)

## 2015-02-28 LAB — CBC
HCT: 34.6 % — ABNORMAL LOW (ref 39.0–52.0)
HEMOGLOBIN: 10.7 g/dL — AB (ref 13.0–17.0)
MCH: 23.8 pg — AB (ref 26.0–34.0)
MCHC: 30.9 g/dL (ref 30.0–36.0)
MCV: 77.1 fL — ABNORMAL LOW (ref 78.0–100.0)
Platelets: 217 10*3/uL (ref 150–400)
RBC: 4.49 MIL/uL (ref 4.22–5.81)
RDW: 16.9 % — ABNORMAL HIGH (ref 11.5–15.5)
WBC: 8.5 10*3/uL (ref 4.0–10.5)

## 2015-02-28 LAB — BASIC METABOLIC PANEL
ANION GAP: 6 (ref 5–15)
BUN: 18 mg/dL (ref 6–20)
CALCIUM: 9.3 mg/dL (ref 8.9–10.3)
CO2: 29 mmol/L (ref 22–32)
Chloride: 102 mmol/L (ref 101–111)
Creatinine, Ser: 1.39 mg/dL — ABNORMAL HIGH (ref 0.61–1.24)
GFR, EST AFRICAN AMERICAN: 59 mL/min — AB (ref >60)
GFR, EST NON AFRICAN AMERICAN: 51 mL/min — AB (ref >60)
Glucose, Bld: 129 mg/dL — ABNORMAL HIGH (ref 65–99)
Potassium: 4.1 mmol/L (ref 3.5–5.1)
SODIUM: 137 mmol/L (ref 135–145)

## 2015-02-28 LAB — CBG MONITORING, ED: GLUCOSE-CAPILLARY: 146 mg/dL — AB (ref 65–99)

## 2015-02-28 MED ORDER — SODIUM CHLORIDE 0.9 % IV BOLUS (SEPSIS): 1000.0000 mL | Freq: Once | INTRAVENOUS | Status: DC

## 2015-02-28 NOTE — Telephone Encounter (Signed)
[facility]  Patient Name: Harold Morris  DOB: Aug 15, 1950    Initial Comment Caller states his BS is 310 right now.Also dizziness.   Nurse Assessment  Nurse: Harlow Mares, RN, Suanne Marker Date/Time Eilene Ghazi Time): 02/28/2015 4:19:59 PM  Confirm and document reason for call. If symptomatic, describe symptoms. ---Caller states his BS is 310 right now. Also dizziness. Reports that she is a diabetic and she does not take insulin. Reports that she takes Acarbose '25mg'$  tid. Reports that she feels lightheaded, and doesn't feel well.  Has the patient traveled out of the country within the last 30 days? ---No  Does the patient have any new or worsening symptoms? ---Yes  Will a triage be completed? ---Yes  Related visit to physician within the last 2 weeks? ---Yes  Does the PT have any chronic conditions? (i.e. diabetes, asthma, etc.) ---Yes  List chronic conditions. ---diabetic, knee and hip pain, hx blood clots     Guidelines    Guideline Title Affirmed Question Affirmed Notes  Diabetes - High Blood Sugar [1] Blood glucose > 240 mg/dl (13 mmol/l) AND [2] rapid breathing    Final Disposition User   Go to ED Now Harlow Mares, RN, [facility] - ED   Disagree/Comply: Comply

## 2015-02-28 NOTE — ED Notes (Signed)
Patient advised urine needed patient states he does not want to ive urine. Patient advised if he can not give urine a cath will need to be done.

## 2015-02-28 NOTE — ED Notes (Signed)
Pt reports cbg 310 pta, having headache, feeling lightheaded and frequent urination.

## 2015-02-28 NOTE — ED Provider Notes (Signed)
CSN: [id number]     Arrival date & time 02/28/15  1644 History   First MD Initiated Contact with Patient 02/28/15 1955     Chief Complaint  Patient presents with  . Hyperglycemia     (Consider location/radiation/quality/duration/timing/severity/associated sxs/prior Treatment) HPI Comments: Here with a brief episode of dizziness. His blood sugar was high today, he had a BS of 310 at home. Upon arrival here, he was in the 150s. Patient here denies CP, SOB, vomiting, diarrhea. He reports chronic orthostatic dizziness.  Patient is a 64 y.o. male presenting with hyperglycemia. The history is provided by the patient.  Hyperglycemia Blood sugar level PTA:  310 Severity:  Mild Onset quality:  Gradual Timing:  Constant Progression:  Unchanged Chronicity:  New Diabetes status:  Controlled with oral medications Relieved by:  Nothing Ineffective treatments:  None tried Associated symptoms: dizziness (mild, light-headed)   Associated symptoms: no abdominal pain, no fever, no shortness of breath and no vomiting     Past Medical History  Diagnosis Date  . Lung cancer ([place])   . Diabetes mellitus without complication ([place])   . High cholesterol   . Gout   . Fractured patella   . Low back pain   . Neck pain   . Pulmonary embolism [facility])    Past Surgical History  Procedure Laterality Date  . Back surgery      x4  . Joint replacement Left     Knee  . Cholecystectomy    . Lung cancer surgery Left 06/2009   Family History  Problem Relation Age of Onset  . Diabetes Mother   . Cancer Brother   . Cancer Sister   . Cancer - Prostate Brother    Social History  Substance Use Topics  . Smoking status: Former Research scientist (life sciences)  . Smokeless tobacco: Never Used  . Alcohol Use: 0.0 oz/week    0 Standard drinks or equivalent per week     Comment: Rarely    Review of Systems  Constitutional: Negative for fever and chills.  Respiratory: Negative for cough and shortness of breath.    Gastrointestinal: Negative for vomiting and abdominal pain.  Neurological: Positive for dizziness (mild, light-headed).  All other systems reviewed and are negative.     Allergies  Epinephrine; Sulfa antibiotics; Oxycontin; and Morphine  Home Medications   Prior to Admission medications   Medication Sig Start Date End Date Taking? Authorizing Provider  acarbose (PRECOSE) 25 MG tablet TAKE 1 TABLET (25 MG TOTAL) BY MOUTH 3 (THREE) TIMES DAILY WITH MEALS. 02/15/15  Yes Golden Circle, FNP  allopurinol (ZYLOPRIM) 300 MG tablet Take 300 mg by mouth daily.   Yes Historical Provider, MD  atorvastatin (LIPITOR) 80 MG tablet Take 80 mg by mouth daily.   Yes Historical Provider, MD  Cyanocobalamin (VITAMIN B-12 CR) 1000 MCG TBCR Take 1,000 mg by mouth daily.   Yes Historical Provider, MD  docusate sodium (COLACE) 100 MG capsule Take 100 mg by mouth 2 (two) times daily.   Yes Historical Provider, MD  ERGOCALCIFEROL PO Take 1 tablet by mouth daily.   Yes Historical Provider, MD  fenofibrate (TRICOR) 145 MG tablet Take 145 mg by mouth daily.   Yes Historical Provider, MD  gabapentin (NEURONTIN) 600 MG tablet Take 600-900 mg by mouth See admin instructions. Takes 2 caps in am and 3 caps at bedtime   Yes   HYDROcodone-acetaminophen (NORCO) 10-325 MG per tablet Take 1 tablet by mouth every 6 (six) hours as needed for  moderate pain.   Yes Historical Provider, MD  magnesium oxide (MAG-OX) 400 MG tablet Take 400 mg by mouth 2 (two) times daily.   Yes Historical Provider, MD  meclizine (ANTIVERT) 25 MG tablet Take 25 mg by mouth daily as needed for dizziness.    Yes Historical Provider, MD  methocarbamol (ROBAXIN) 500 MG tablet Take 1 tablet (500 mg total) by mouth 2 (two) times daily as needed for muscle spasms. 01/09/15  Yes Waynetta Pean, PA-C  omeprazole (PRILOSEC) 20 MG capsule Take 20 mg by mouth daily.   Yes Historical Provider, MD  Rivaroxaban (XARELTO) 15 MG TABS tablet Take 15 mg by mouth daily.     Yes Historical Provider, MD  sertraline (ZOLOFT) 100 MG tablet Take 100 mg by mouth daily.   Yes Historical Provider, MD  Eluxadoline (VIBERZI) 75 MG TABS Take 75 mg by mouth 2 (two) times daily. Patient not taking: Reported on 02/28/2015 02/18/15   Golden Circle, FNP   BP 116/60 mmHg  Pulse 69  Temp(Src) 98.4 F (36.9 C) (Oral)  Resp 19  SpO2 100% Physical Exam  Constitutional: He is oriented to person, place, and time. He appears well-developed and well-nourished. No distress.  HENT:  Head: Normocephalic and atraumatic.  Mouth/Throat: Oropharynx is clear and moist. No oropharyngeal exudate.  Eyes: EOM are normal. Pupils are equal, round, and reactive to light.  Neck: Normal range of motion. Neck supple.  Cardiovascular: Normal rate and regular rhythm.  Exam reveals no friction rub.   No murmur heard. Pulmonary/Chest: Effort normal and breath sounds normal. No respiratory distress. He has no wheezes. He has no rales.  Abdominal: Soft. He exhibits no distension. There is no tenderness. There is no rebound.  Musculoskeletal: Normal range of motion. He exhibits no edema.  Neurological: He is alert and oriented to person, place, and time.  Skin: No rash noted. He is not diaphoretic.  Nursing note and vitals reviewed.   ED Course  Procedures (including critical care time) Labs Review Labs Reviewed  BASIC METABOLIC PANEL - Abnormal; Notable for the following:    Glucose, Bld 129 (*)    Creatinine, Ser 1.39 (*)    GFR calc non Af Amer 51 (*)    GFR calc Af Amer 59 (*)    All other components within normal limits  CBC - Abnormal; Notable for the following:    Hemoglobin 10.7 (*)    HCT 34.6 (*)    MCV 77.1 (*)    MCH 23.8 (*)    RDW 16.9 (*)    All other components within normal limits  CBG MONITORING, ED - Abnormal; Notable for the following:    Glucose-Capillary 146 (*)    All other components within normal limits  URINALYSIS, ROUTINE W REFLEX MICROSCOPIC (NOT AT [facility])   I-STAT TROPOININ, ED    Imaging Review No results found. I have personally reviewed and evaluated these images and lab results as part of my medical decision-making.   EKG Interpretation   Date/Time:  Monday February 28 2015 17:23:02 EDT Ventricular Rate:  82 PR Interval:  140 QRS Duration: 86 QT Interval:  370 QTC Calculation: 432 R Axis:   47 Text Interpretation:  Normal sinus rhythm Inferior infarct , age  undetermined Abnormal ECG Mild inferior T wave changes Confirmed by Mingo Amber   MD, [place] ([id number]) on 02/28/2015 11:42:54 PM      MDM   Final diagnoses:  Dizziness    35 show male here with one episode  of dizziness. Brief, describes lightheadedness. Did not have any syncopal event. Did not have any near-syncope. No chest pain, fundus of breath. His blood sugars elevated home at [address] [facility] for this happened. He has not had any vomiting, diarrhea. No abdominal pain. He is well-appearing here with normal blood sugars. Labs look normal. His EKG just shows some inferior T-wave inversions. Will check a troponin and orthostatic vitals. Without any active chest pain or shortness of breath or dizziness and normal labs, I do not feel he needs to come into the hospital. With his chronic dizziness, I do not feel his dizziness today is much different than normal. He is orthostatic, but he said he feels normal. He denies feeling like he's going to pass out. He is now recommended a hospital in IV for fluids. We ambulated him and he did well. He is able to drink fluids here. Instructed to follow-up with his PCP. Stable for discharge. Troponin is normal. He does have some mild EKG changes, but with no active chest pain or shortness of breath and symptoms appear chronic, do not feel he warrants admission for this.  Evelina Bucy, MD 02/28/15 [phone]

## 2015-02-28 NOTE — Discharge Instructions (Signed)

## 2015-03-01 LAB — CBG MONITORING, ED: Glucose-Capillary: 107 mg/dL — ABNORMAL HIGH (ref 65–99)

## 2015-03-04 ENCOUNTER — Telehealth: Payer: Self-pay | Admitting: Family

## 2015-03-04 NOTE — Telephone Encounter (Signed)
Pt called request refill for acarbose (PRECOSE) 25 MG 1 month supply to be send to [facility] on [address]. Pt was wondering if we can check with the assistant if they send this medication to the [place] yet? Please call pt, he is out of this med.

## 2015-03-04 NOTE — Telephone Encounter (Signed)
Please advise, thanks.

## 2015-03-08 NOTE — Telephone Encounter (Signed)
LVM for pt to call back.

## 2015-03-09 ENCOUNTER — Other Ambulatory Visit: Payer: Self-pay

## 2015-03-09 MED ORDER — ACARBOSE 25 MG PO TABS
ORAL_TABLET | ORAL | Status: DC
Start: 2015-03-09 — End: 2015-08-22

## 2015-03-09 MED ORDER — ACARBOSE 25 MG PO TABS: ORAL_TABLET | ORAL | Status: DC

## 2015-03-09 NOTE — Telephone Encounter (Signed)
[facility] month supply sent to [facility] on [address]

## 2015-05-16 ENCOUNTER — Telehealth: Payer: Self-pay | Admitting: Pulmonary Disease

## 2015-05-16 NOTE — Telephone Encounter (Signed)
I called and spoke with him.   

## 2015-05-16 NOTE — Telephone Encounter (Signed)
Patient says that at times he gets very short of breath.  He says that if he forgets to take his medication he gets very short of breath.  Wants to know what is causing the blood to collect in his lungs?  Where is the blood coming from that is getting into his lungs?  Patient says that he does not want to take medications for the rest of his life and he would like to find out where the blood is coming from and stop it from flowing into his lungs so he doesn't have to take the medications and he doesn't have SOB anymore.  Dr. Vaughan Browner, please advise.

## 2015-06-24 ENCOUNTER — Encounter (HOSPITAL_COMMUNITY): Payer: Self-pay | Admitting: Emergency Medicine

## 2015-06-24 ENCOUNTER — Emergency Department (HOSPITAL_COMMUNITY)
Admission: EM | Admit: 2015-06-24 | Discharge: 2015-06-24 | Disposition: A | Discharge status: Home / Self Care | Payer: Non-veteran care | Attending: Emergency Medicine | Admitting: Emergency Medicine

## 2015-06-24 DIAGNOSIS — E7800 Pure hypercholesterolemia, unspecified: Secondary | ICD-10-CM | POA: Diagnosis not present

## 2015-06-24 DIAGNOSIS — Z7901 Long term (current) use of anticoagulants: Secondary | ICD-10-CM | POA: Diagnosis not present

## 2015-06-24 DIAGNOSIS — Z8781 Personal history of (healed) traumatic fracture: Secondary | ICD-10-CM | POA: Insufficient documentation

## 2015-06-24 DIAGNOSIS — Z86711 Personal history of pulmonary embolism: Secondary | ICD-10-CM | POA: Insufficient documentation

## 2015-06-24 DIAGNOSIS — Z85118 Personal history of other malignant neoplasm of bronchus and lung: Secondary | ICD-10-CM | POA: Diagnosis not present

## 2015-06-24 DIAGNOSIS — Y998 Other external cause status: Secondary | ICD-10-CM | POA: Diagnosis not present

## 2015-06-24 DIAGNOSIS — Z87891 Personal history of nicotine dependence: Secondary | ICD-10-CM | POA: Insufficient documentation

## 2015-06-24 DIAGNOSIS — Z79899 Other long term (current) drug therapy: Secondary | ICD-10-CM | POA: Diagnosis not present

## 2015-06-24 DIAGNOSIS — E119 Type 2 diabetes mellitus without complications: Secondary | ICD-10-CM | POA: Diagnosis not present

## 2015-06-24 DIAGNOSIS — S61210A Laceration without foreign body of right index finger without damage to nail, initial encounter: Secondary | ICD-10-CM | POA: Insufficient documentation

## 2015-06-24 DIAGNOSIS — Y9289 Other specified places as the place of occurrence of the external cause: Secondary | ICD-10-CM | POA: Diagnosis not present

## 2015-06-24 DIAGNOSIS — Y9389 Activity, other specified: Secondary | ICD-10-CM | POA: Insufficient documentation

## 2015-06-24 DIAGNOSIS — W268XXA Contact with other sharp object(s), not elsewhere classified, initial encounter: Secondary | ICD-10-CM | POA: Diagnosis not present

## 2015-06-24 DIAGNOSIS — M109 Gout, unspecified: Secondary | ICD-10-CM | POA: Insufficient documentation

## 2015-06-24 DIAGNOSIS — IMO0002 Reserved for concepts with insufficient information to code with codable children: Secondary | ICD-10-CM

## 2015-06-24 MED ORDER — LIDOCAINE HCL (PF) 1 % IJ SOLN
5.0000 mL | Freq: Once | INTRAMUSCULAR | Status: AC
  Administered 2015-06-24: 5 mL
  Filled 2015-06-24: qty 5

## 2015-06-24 NOTE — ED Notes (Signed)
Pt st's he cut his finger on a ring cutter,  Pt has lac to right index finger  At the dip joint,  Pt is currently on Xarelto   But no active bleeding at this time

## 2015-06-24 NOTE — ED Provider Notes (Signed)
CSN: [id number]     Arrival date & time 06/24/15  1537 History  By signing my name below, I, Soijett Blue, attest that this documentation has been prepared under the direction and in the presence of Lyvia Mondesir, PA-C Electronically Signed: Soijett Blue, ED Scribe. 06/24/2015. 4:21 PM.    Chief Complaint  Patient presents with  . Laceration      The history is provided by the patient. No language interpreter was used.    Harold Morris is a 65 y.o. male with a medical hx of DM, high cholesterol, lung CA, who presents to the Emergency Department complaining of right index finger laceration onset PTA. He notes that he was squeezing rings when he slipped and cut his right index finger. Pt notes that he is right hand dominant. He reports that he is UTD on his tetanus and he has had it within the past 5 years. He states that he has tried applying pressure for the relief for his symptoms. He denies any other symptoms. Pt is currently taking xarelto at this time.   PCP: Mauricio Po, FNP   Past Medical History  Diagnosis Date  . Lung cancer ([place])   . Diabetes mellitus without complication ([place])   . High cholesterol   . Gout   . Fractured patella   . Low back pain   . Neck pain   . Pulmonary embolism [facility])    Past Surgical History  Procedure Laterality Date  . Back surgery      x4  . Joint replacement Left     Knee  . Cholecystectomy    . Lung cancer surgery Left 06/2009   Family History  Problem Relation Age of Onset  . Diabetes Mother   . Cancer Brother   . Cancer Sister   . Cancer - Prostate Brother    Social History  Substance Use Topics  . Smoking status: Former Research scientist (life sciences)  . Smokeless tobacco: Never Used  . Alcohol Use: 0.0 oz/week    0 Standard drinks or equivalent per week     Comment: Rarely    Review of Systems  Constitutional: Negative for fever.  Musculoskeletal: Negative for joint swelling.  Skin: Positive for wound (Right index finger laceration). Negative  for color change.  Neurological: Negative for weakness and numbness.      Allergies  Epinephrine; Sulfa antibiotics; Oxycontin; and Morphine  Home Medications   Prior to Admission medications   Medication Sig Start Date End Date Taking? Authorizing Provider  acarbose (PRECOSE) 25 MG tablet TAKE 1 TABLET (25 MG TOTAL) BY MOUTH 3 (THREE) TIMES DAILY WITH MEALS. 03/09/15   Golden Circle, FNP  allopurinol (ZYLOPRIM) 300 MG tablet Take 300 mg by mouth daily.    Historical Provider, MD  atorvastatin (LIPITOR) 80 MG tablet Take 80 mg by mouth daily.    Historical Provider, MD  Cyanocobalamin (VITAMIN B-12 CR) 1000 MCG TBCR Take 1,000 mg by mouth daily.    Historical Provider, MD  docusate sodium (COLACE) 100 MG capsule Take 100 mg by mouth 2 (two) times daily.    Historical Provider, MD  Eluxadoline (VIBERZI) 75 MG TABS Take 75 mg by mouth 2 (two) times daily. Patient not taking: Reported on 02/28/2015 02/18/15   Golden Circle, FNP  ERGOCALCIFEROL PO Take 1 tablet by mouth daily.    Historical Provider, MD  fenofibrate (TRICOR) 145 MG tablet Take 145 mg by mouth daily.    Historical Provider, MD  gabapentin (NEURONTIN) 600 MG  tablet Take 600-900 mg by mouth See admin instructions. Takes 2 caps in am and 3 caps at bedtime      HYDROcodone-acetaminophen (NORCO) 10-325 MG per tablet Take 1 tablet by mouth every 6 (six) hours as needed for moderate pain.    Historical Provider, MD  magnesium oxide (MAG-OX) 400 MG tablet Take 400 mg by mouth 2 (two) times daily.    Historical Provider, MD  meclizine (ANTIVERT) 25 MG tablet Take 25 mg by mouth daily as needed for dizziness.     Historical Provider, MD  methocarbamol (ROBAXIN) 500 MG tablet Take 1 tablet (500 mg total) by mouth 2 (two) times daily as needed for muscle spasms. 01/09/15   Waynetta Pean, PA-C  omeprazole (PRILOSEC) 20 MG capsule Take 20 mg by mouth daily.    Historical Provider, MD  Rivaroxaban (XARELTO) 15 MG TABS tablet Take 15 mg  by mouth daily.     Historical Provider, MD  sertraline (ZOLOFT) 100 MG tablet Take 100 mg by mouth daily.    Historical Provider, MD   BP 106/90 mmHg  Pulse 85  Temp(Src) 97.6 F (36.4 C) (Oral)  Resp 16  Ht '5\' 9"'$  (1.753 m)  Wt 108.863 kg  BMI 35.43 kg/m2  SpO2 100% Physical Exam  Physical Exam  Constitutional: Pt is oriented to person, place, and time. Appears well-developed and well-nourished. No distress.  HENT:  Head: Normocephalic and atraumatic.  Eyes: Conjunctivae are normal.   Cardiovascular: Normal rate, regular rhythm, Capillary refill < 2 sec  Pulmonary/Chest: Effort normal. No respiratory distress.  Musculoskeletal: Normal range of motion. Exhibits no edema.  ROM: 5/5  Neurological: Pt is alert and oriented to person, place, and time.  Sensation: 5/5 Strength: 5/5  Skin: Skin is warm and dry. Pt. is not diaphoretic. 1.5 cm straight laceration at the DIP joint on the right second finger on the palmar surface. Circulation, motor, and sensory intact distal to the wound. Bottom of wound visualized with a clear field. Bleeding controlled prior to this exam.  Nursing note and vitals reviewed.   ED Course  Procedures (including critical care time) DIAGNOSTIC STUDIES: Oxygen Saturation is 100% on RA, nl by my interpretation.    COORDINATION OF CARE: 4:12 PM Discussed treatment plan with pt at bedside which includes laceration repair and pt agreed to plan.   LACERATION REPAIR PROCEDURE NOTE The patient's identification was confirmed and consent was obtained. This procedure was performed by Tawanna Sat, MD, [place] Resident, under the supervision of Arlean Hopping, [place] at 4:21 PM. Site: DIP joint at right second finger Sterile procedures observed: YES Anesthetic used (type and amt): 1 % Lidocaine without Epinephrine and 4 ml used Suture type/size:5-0 Prolene Length: 1.5 cm # of Sutures: 5 Technique:Simple interrupted Complexity: SImple Antibx  ointment applied: bacitracin Tetanus UTD or ordered: UTD Site anesthetized, irrigated with NS, bottom of the wound was visualized with clear field, explored without evidence of foreign body, wound well approximated, site covered with dry, sterile dressing.   a finger splint was also applied to prevent harmful movements and to protect the finger against further trauma. Patient tolerated procedure well without complications. Instructions for care discussed verbally and patient provided with additional written instructions for homecare and f/u.  Tawanna Sat, MD 06/24/2015, 4:52 PM PGY-3, [place] - No data to display  Imaging Review No results found.    EKG Interpretation None      MDM  Final diagnoses:  Laceration    Tetanus UTD. Laceration occurred < 12 hours prior to repair. Discussed laceration care with pt and answered questions. Pt to f-u for suture removal in 10 days with a wound check in 3 days by his PCP. Patient advised to return sooner should there be signs of dehiscence or infection. Pt is hemodynamically stable with no complaints prior to dc.  Patient was given instructions for home care as well as detailed return precautions. Patient voiced understanding of these instructions, accepts the plan, and is comfortable with discharge. Patient meets no criteria for antibiotic use or expert consult.   I personally performed the services described in this documentation, which was scribed in my presence. The recorded information has been reviewed and is accurate.    Lorayne Bender, PA-C 06/24/15 [place], MD 06/26/15 Shelah Lewandowsky

## 2015-06-24 NOTE — Discharge Instructions (Signed)
You have been seen today for a finger laceration. Return to the ED or go to your PCP in 3 days for a wound check. Return to the ED or go to your PCP in 10 days for suture removal. Return to ED should symptoms worsen or signs of infection arise, as detailed in the following documents. Use Tylenol or ibuprofen for pain relief.

## 2015-07-08 ENCOUNTER — Telehealth: Payer: Self-pay | Admitting: Pulmonary Disease

## 2015-07-08 NOTE — Telephone Encounter (Signed)
Called spoke with pt. He is currently on xarelto and is being seen by the [place] as well. He is wanting to know for how long he will need to be on xarelto for his PE?  Please advise Dr. Vaughan Browner thanks

## 2015-07-11 NOTE — Telephone Encounter (Signed)
Dr. Vaughan Browner, please advise

## 2015-07-12 NOTE — Telephone Encounter (Signed)
I called and spoke with the pt. He will continue the xarelto until he sees me in office when we can discuss the risks/benefits of stopping it.

## 2015-08-19 ENCOUNTER — Telehealth: Payer: Self-pay | Admitting: Pulmonary Disease

## 2015-08-19 NOTE — Telephone Encounter (Signed)
Patient was supposed to be scheduled for PFT prior to visit 08/22/15 with PM.  Last seen 01/2015 and PFT was never scheduled.  Called [facility] and was able to get PFT scheduled for 11:00  Called pt and LM to return call regarding his appt Monday.  Will send to [facility] to follow up.

## 2015-08-22 ENCOUNTER — Ambulatory Visit (HOSPITAL_COMMUNITY)
Admission: RE | Admit: 2015-08-22 | Discharge: 2015-08-22 | Disposition: A | Discharge status: Home / Self Care | Payer: Medicare Other | Source: Ambulatory Visit | Attending: Pulmonary Disease | Admitting: Pulmonary Disease

## 2015-08-22 ENCOUNTER — Ambulatory Visit (INDEPENDENT_AMBULATORY_CARE_PROVIDER_SITE_OTHER): Payer: Medicare Other | Admitting: Pulmonary Disease

## 2015-08-22 ENCOUNTER — Encounter: Payer: Self-pay | Admitting: Pulmonary Disease

## 2015-08-22 ENCOUNTER — Other Ambulatory Visit: Payer: Medicare Other

## 2015-08-22 VITALS — BP 122/68 | HR 78 | Ht 69.0 in | Wt 240.0 lb

## 2015-08-22 DIAGNOSIS — I2699 Other pulmonary embolism without acute cor pulmonale: Secondary | ICD-10-CM

## 2015-08-22 LAB — PULMONARY FUNCTION TEST
DL/VA % pred: 90 %
DL/VA: 4.1 ml/min/mmHg/L
DLCO UNC % PRED: 61 %
DLCO UNC: 18.95 ml/min/mmHg
FEF 25-75 Post: 3.12 L/sec
FEF 25-75 Pre: 2.45 L/sec
FEF2575-%Change-Post: 27 %
FEF2575-%PRED-POST: 123 %
FEF2575-%Pred-Pre: 97 %
FEV1-%CHANGE-POST: 6 %
FEV1-%PRED-POST: 79 %
FEV1-%PRED-PRE: 75 %
FEV1-POST: 2.58 L
FEV1-Pre: 2.43 L
FEV1FVC-%Change-Post: 1 %
FEV1FVC-%Pred-Pre: 107 %
FEV6-%Change-Post: 4 %
FEV6-%PRED-POST: 77 %
FEV6-%PRED-PRE: 73 %
FEV6-POST: 3.18 L
FEV6-PRE: 3.04 L
FEV6FVC-%CHANGE-POST: 0 %
FEV6FVC-%PRED-PRE: 106 %
FEV6FVC-%Pred-Post: 106 %
FVC-%Change-Post: 4 %
FVC-%PRED-POST: 73 %
FVC-%Pred-Pre: 70 %
FVC-Post: 3.19 L
FVC-Pre: 3.06 L
POST FEV1/FVC RATIO: 81 %
POST FEV6/FVC RATIO: 100 %
PRE FEV6/FVC RATIO: 100 %
Pre FEV1/FVC ratio: 79 %
RV % PRED: 77 %
RV: 1.83 L
TLC % PRED: 74 %
TLC: 5.09 L

## 2015-08-22 MED ORDER — ALBUTEROL SULFATE (2.5 MG/3ML) 0.083% IN NEBU
2.5000 mg | INHALATION_SOLUTION | Freq: Once | RESPIRATORY_TRACT | Status: AC
  Administered 2015-08-22: 2.5 mg via RESPIRATORY_TRACT

## 2015-08-22 NOTE — Patient Instructions (Signed)
It is okay to stop the Xarelto as long as we accept the risk of recurrent PE.  Return to clinic in 6 months.

## 2015-08-22 NOTE — Progress Notes (Signed)
Subjective:    Patient ID: Harold Morris, male    DOB: 01-10-1951, 65 y.o.   MRN: [id number]  HPI   Follow for management of PE  Harold Morris is a 65 year old male who gets most of his care at the [facility]. He has history of lung cancer diagnosed at 2010 at the [place]. This was treated with the left lower lobectomy and chemotherapy in 2011. As per the patient yesterday to follow-up at the [place] and no apparent recurrence of the cancer.  He has been complaining of worsening dyspnea ever since his lung resection. This has worsened more acutely last month. He was admitted on 12/31/14 with a diagnosis of of right-sided PE. There is evidence of mild RV strain with a RV/LV ratio of 1:1. There is mild dilation of RV on echocardiogram but Troponin levels were normal. There is no evidence of DVT. There are no precipitating reasons for PE. No recent travel immobilization or surgery. There is no family history of clots or hypercoagulable state.   DATA: Protein C > 200 Protein S 138  PFTs 08/22/15 FVC 2.06 [70%) And FEV1 2.43 [75%) F/F 79 TLC 74% DLCO 61%.  Social History: H/O smoking. 1ppd for 12 years. Quit in 1985.  Family History:  Brother- Cancer Sister- Cancer Mother- Diabetes   Past Medical History  Diagnosis Date  . Lung cancer ([place])   . Diabetes mellitus without complication ([place])   . High cholesterol   . Gout   . Fractured patella   . Low back pain   . Neck pain   . Pulmonary embolism ([place])      Current outpatient prescriptions:  .  allopurinol (ZYLOPRIM) 300 MG tablet, Take 300 mg by mouth daily., Disp: , Rfl:  .  atorvastatin (LIPITOR) 80 MG tablet, Take 80 mg by mouth daily., Disp: , Rfl:  .  Cyanocobalamin (VITAMIN B-12 CR) 1000 MCG TBCR, Take 1,000 mcg by mouth daily., Disp: , Rfl:  .  docusate sodium (COLACE) 100 MG capsule, Per bottle as needed, Disp: , Rfl:  .  ERGOCALCIFEROL PO, Take 1 tablet by mouth daily., Disp: , Rfl:  .  fenofibrate (TRICOR) 145 MG  tablet, Take 145 mg by mouth daily., Disp: , Rfl:  .  fluocinonide cream (LIDEX) 0.05 %, Apply as needed, Disp: , Rfl:  .  gabapentin (NEURONTIN) 600 MG tablet, Take 600-900 mg by mouth See admin instructions. Takes 2 caps in am and 3 caps at bedtime, Disp: , Rfl:  .  HYDROcodone-acetaminophen (NORCO) 10-325 MG per tablet, Take 1 tablet by mouth every 6 (six) hours as needed for moderate pain., Disp: , Rfl:  .  magnesium oxide (MAG-OX) 400 MG tablet, Take 400 mg by mouth 2 (two) times daily., Disp: , Rfl:  .  meclizine (ANTIVERT) 25 MG tablet, Take 25 mg by mouth daily as needed for dizziness. , Disp: , Rfl:  .  methocarbamol (ROBAXIN) 500 MG tablet, Take 1 tablet (500 mg total) by mouth 2 (two) times daily as needed for muscle spasms., Disp: 20 tablet, Rfl: 0 .  omeprazole (PRILOSEC) 20 MG capsule, Take 20 mg by mouth daily., Disp: , Rfl:  .  Rivaroxaban (XARELTO) 15 MG TABS tablet, Take 15 mg by mouth daily. , Disp: , Rfl:  .  sertraline (ZOLOFT) 100 MG tablet, Take 100 mg by mouth daily., Disp: , Rfl:  .  triamcinolone cream (KENALOG) 0.1 %, Apply as needed, Disp: , Rfl:  .  Eluxadoline (VIBERZI) 75  MG TABS, Take 75 mg by mouth 2 (two) times daily. (Patient not taking: Reported on 08/22/2015), Disp: 180 tablet, Rfl: 0  Review of Systems  Constitutional: Positive for unexpected weight change. Negative for fever.  HENT: Positive for congestion and sinus pressure. Negative for dental problem, ear pain, nosebleeds, postnasal drip, rhinorrhea, sneezing, sore throat and trouble swallowing.   Eyes: Negative for redness and itching.  Respiratory: Positive for shortness of breath. Negative for cough, chest tightness and wheezing.   Cardiovascular: Positive for palpitations. Negative for leg swelling.  Gastrointestinal: Negative for nausea and vomiting.  Genitourinary: Negative for dysuria.  Musculoskeletal: Negative for joint swelling.  Skin: Negative for rash.  Neurological: Positive for headaches.    Hematological: Does not bruise/bleed easily.  Psychiatric/Behavioral: Negative for dysphoric mood. The patient is not nervous/anxious.    Blood pressure 122/68, pulse 78, height '5\' 9"'$  (1.753 m), weight 240 lb (108.863 kg), SpO2 98 %.     Objective:   Physical Exam  Constitutional: He is oriented to person, place, and time. He appears well-developed and well-nourished.  HENT:  Mouth/Throat: Oropharynx is clear and moist.  Eyes: Pupils are equal, round, and reactive to light.  Neck: Normal range of motion. Neck supple.  Cardiovascular: Normal rate and regular rhythm.   Pulmonary/Chest: Effort normal and breath sounds normal.  Abdominal: Soft. Bowel sounds are normal.  Neurological: He is alert and oriented to person, place, and time.  Skin: Skin is warm and dry.      Assessment & Plan:   Right-sided PE. This is apparently unprovoked. Although he has history of lung cancer, this has been resected with no evidence of recurrence. He has just completed 7 months of anticoagulation and would really like to stop Xarelto. In fact he's taken himself off it 2 weeks ago. He has history of chronic spine problems and would like to resume pain injections when he is off the Xarelto. He is convinced that he had the lung clot since his cancer resection in 2011 as his symptoms started at that time. He reports an improvement in dyspnea while on the Xarelto.  We discussed the risk benefits of lifelong anticoagulation. He could be considered a candidate for that in this case of unprovoked PE. But he has risk factors for bleeding including age, diabetes mellitus, unsteadiness of gait, falls. He will stop Xarelto and continue to monitor symptoms. His hypercoagulable panel shows normal protein C, S however the factor V Leiden and antithrombin tests were not done. We will sends these off today.  PFTs show some restriction which is likely secondary to his lung resection.  Plan: - Hypercoagulable panel. - Stop  Xarelto  Return to clinic in 6 months.  Marshell Garfinkel MD [facility] Pager [phone] If no answer or after 3pm call: [phone] 08/22/2015, 1:48 PM

## 2015-08-23 LAB — ANTITHROMBIN PANEL
AT III AG PPP IMM-ACNC: 116 % (ref 72–124)
AntiThromb III Func: 111 % (ref 75–135)

## 2015-08-24 NOTE — Telephone Encounter (Signed)
PFT was done 3.27.17 at [facility] and pt seen by PM Will sign off

## 2015-08-30 LAB — PROTHROMBIN GENE MUTATION

## 2015-08-30 LAB — FACTOR 5 LEIDEN

## 2016-01-04 DIAGNOSIS — R109 Unspecified abdominal pain: Secondary | ICD-10-CM | POA: Diagnosis not present

## 2016-01-04 DIAGNOSIS — G8929 Other chronic pain: Secondary | ICD-10-CM | POA: Diagnosis not present

## 2016-01-04 DIAGNOSIS — K529 Noninfective gastroenteritis and colitis, unspecified: Secondary | ICD-10-CM | POA: Diagnosis not present

## 2016-01-04 DIAGNOSIS — Z86010 Personal history of colonic polyps: Secondary | ICD-10-CM | POA: Diagnosis not present

## 2016-01-16 DIAGNOSIS — Z86010 Personal history of colonic polyps: Secondary | ICD-10-CM | POA: Diagnosis not present

## 2016-01-16 DIAGNOSIS — R197 Diarrhea, unspecified: Secondary | ICD-10-CM | POA: Diagnosis not present

## 2016-01-16 DIAGNOSIS — D124 Benign neoplasm of descending colon: Secondary | ICD-10-CM | POA: Diagnosis not present

## 2016-01-16 DIAGNOSIS — D122 Benign neoplasm of ascending colon: Secondary | ICD-10-CM | POA: Diagnosis not present

## 2016-02-21 ENCOUNTER — Encounter: Payer: Self-pay | Admitting: Pulmonary Disease

## 2016-02-21 ENCOUNTER — Ambulatory Visit (INDEPENDENT_AMBULATORY_CARE_PROVIDER_SITE_OTHER): Payer: Medicare Other | Admitting: Pulmonary Disease

## 2016-02-21 VITALS — BP 124/74 | HR 87 | Ht 69.0 in | Wt 234.4 lb

## 2016-02-21 DIAGNOSIS — I2699 Other pulmonary embolism without acute cor pulmonale: Secondary | ICD-10-CM

## 2016-02-21 NOTE — Progress Notes (Signed)
Subjective:    Patient ID: Harold Morris, male    DOB: [birth year], 65 y.o.   MRN: [id number]  HPI   Follow for management of PE  Mr. Boody is a 65 year old male who gets most of his care at the [facility]. He has history of lung cancer diagnosed at 2010 at the [place]. This was treated with the left lower lobectomy and chemotherapy in 2011. As per the patient yesterday to follow-up at the [place] and no apparent recurrence of the cancer.  He has been complaining of worsening dyspnea ever since his lung resection. This has worsened more acutely last month. He was admitted on 12/31/14 with a diagnosis of of right-sided PE. There is evidence of mild RV strain with a RV/LV ratio of 1:1. There is mild dilation of RV on echocardiogram but Troponin levels were normal. There is no evidence of DVT. There are no precipitating reasons for PE. No recent travel immobilization or surgery. There is no family history of clots or hypercoagulable state.  At last visit he took himself off anticoagulation since he felt he did not need it. Does not report any new complaints since then. He denies any chest pain, dyspnea, wheezing, cough, sputum production. He does not have any lower extremity edema, tenderness, pain.   DATA: 02/16/15 Protein C > 200 Protein S 138  08/22/15 AT activity 111 Prothormbin gene mutation, Factor V leidin mutation- Negative  PFTs 08/22/15 FVC 2.06 [70%) And FEV1 2.43 [75%) F/F 79 TLC 74% DLCO 61%.  Social History: H/O smoking. 1ppd for 12 years. Quit in 1985.  Family History:  Brother- Cancer Sister- Cancer Mother- Diabetes   Past Medical History:  Diagnosis Date  . Diabetes mellitus without complication ([place])   . Fractured patella   . Gout   . High cholesterol   . Low back pain   . Lung cancer ([place])   . Neck pain   . Pulmonary embolism ([place])      Current Outpatient Prescriptions:  .  allopurinol (ZYLOPRIM) 300 MG tablet, Take 300 mg by mouth daily., Disp: ,  Rfl:  .  atorvastatin (LIPITOR) 80 MG tablet, Take 80 mg by mouth daily., Disp: , Rfl:  .  Cyanocobalamin (VITAMIN B-12 CR) 1000 MCG TBCR, Take 1,000 mcg by mouth daily., Disp: , Rfl:  .  docusate sodium (COLACE) 100 MG capsule, Per bottle as needed, Disp: , Rfl:  .  Eluxadoline (VIBERZI) 75 MG TABS, Take 75 mg by mouth 2 (two) times daily., Disp: 180 tablet, Rfl: 0 .  ERGOCALCIFEROL PO, Take 1 tablet by mouth daily., Disp: , Rfl:  .  fenofibrate (TRICOR) 145 MG tablet, Take 145 mg by mouth daily., Disp: , Rfl:  .  fluocinonide cream (LIDEX) 0.05 %, Apply as needed, Disp: , Rfl:  .  gabapentin (NEURONTIN) 600 MG tablet, Take 600-900 mg by mouth See admin instructions. Takes 2 caps in am and 3 caps at bedtime, Disp: , Rfl:  .  HYDROcodone-acetaminophen (NORCO) 10-325 MG per tablet, Take 1 tablet by mouth every 6 (six) hours as needed for moderate pain., Disp: , Rfl:  .  magnesium oxide (MAG-OX) 400 MG tablet, Take 400 mg by mouth 2 (two) times daily., Disp: , Rfl:  .  meclizine (ANTIVERT) 25 MG tablet, Take 25 mg by mouth daily as needed for dizziness. , Disp: , Rfl:  .  methocarbamol (ROBAXIN) 500 MG tablet, Take 1 tablet (500 mg total) by mouth 2 (two) times daily as needed  for muscle spasms., Disp: 20 tablet, Rfl: 0 .  omeprazole (PRILOSEC) 20 MG capsule, Take 20 mg by mouth daily., Disp: , Rfl:  .  sertraline (ZOLOFT) 100 MG tablet, Take 100 mg by mouth daily., Disp: , Rfl:  .  triamcinolone cream (KENALOG) 0.1 %, Apply as needed, Disp: , Rfl:   Review of Systems  Constitutional: Negative for fever and unexpected weight change.  HENT: Negative for congestion, dental problem, ear pain, nosebleeds, postnasal drip, rhinorrhea, sinus pressure, sneezing, sore throat and trouble swallowing.   Eyes: Negative for redness and itching.  Respiratory: Negative for cough, chest tightness, shortness of breath and wheezing.   Cardiovascular: Positive for palpitations. Negative for leg swelling.    Gastrointestinal: Negative for nausea and vomiting.  Genitourinary: Negative for dysuria.  Musculoskeletal: Negative for joint swelling.  Skin: Negative for rash.  Neurological: Negative for headaches.  Hematological: Does not bruise/bleed easily.  Psychiatric/Behavioral: Negative for dysphoric mood. The patient is not nervous/anxious.    Blood pressure 122/68, pulse 78, height '5\' 9"'$  (1.753 m), weight 240 lb (108.863 kg), SpO2 98 %.     Objective:   Physical Exam  Constitutional: He is oriented to person, place, and time. He appears well-developed and well-nourished.  HENT:  Mouth/Throat: Oropharynx is clear and moist.  Eyes: Pupils are equal, round, and reactive to light.  Neck: Normal range of motion. Neck supple.  Cardiovascular: Normal rate and regular rhythm.   Pulmonary/Chest: Effort normal and breath sounds normal.  Abdominal: Soft. Bowel sounds are normal.  Neurological: He is alert and oriented to person, place, and time.  Skin: Skin is warm and dry.      Assessment & Plan:   Right-sided PE. This is apparently unprovoked. Although he has history of lung cancer, this has been resected with no evidence of recurrence. He has completed 7 months of anticoagulation. As per his preference he is off xarelto since early 2017. He has history of chronic spine problems and would like to resume pain injections. He is convinced that he had the lung clot since his cancer resection in 2011 as his symptoms started at that time.   We discussed the risk benefits of lifelong anticoagulation. He could be considered a candidate for that in this case of unprovoked PE. But he has risk factors for bleeding including age, diabetes mellitus, unsteadiness of gait, falls.   He will continue off the anticoagulation. He know to get revaluated for PE if he develops  LE edema, dyspnea, chest pain.   Plan: - Observe off Xarelto. - Return to clinic as needed.  Marshell Garfinkel MD  Pulmonary and  Critical Care Pager [phone] If no answer or after 3pm call: [phone] 02/21/2016, 1:48 PM

## 2017-06-15 IMAGING — DX DG CHEST 2V
2 series · 2 of 2 positions shown · non-contrast
Comparison: Chest radiograph and CTA of the chest performed
12/31/2014

CLINICAL DATA: Acute onset of generalized chest and neck pain.
Shortness of breath. High blood pressure. Initial encounter.

EXAM:
CHEST  2 VIEW

[chest pa]
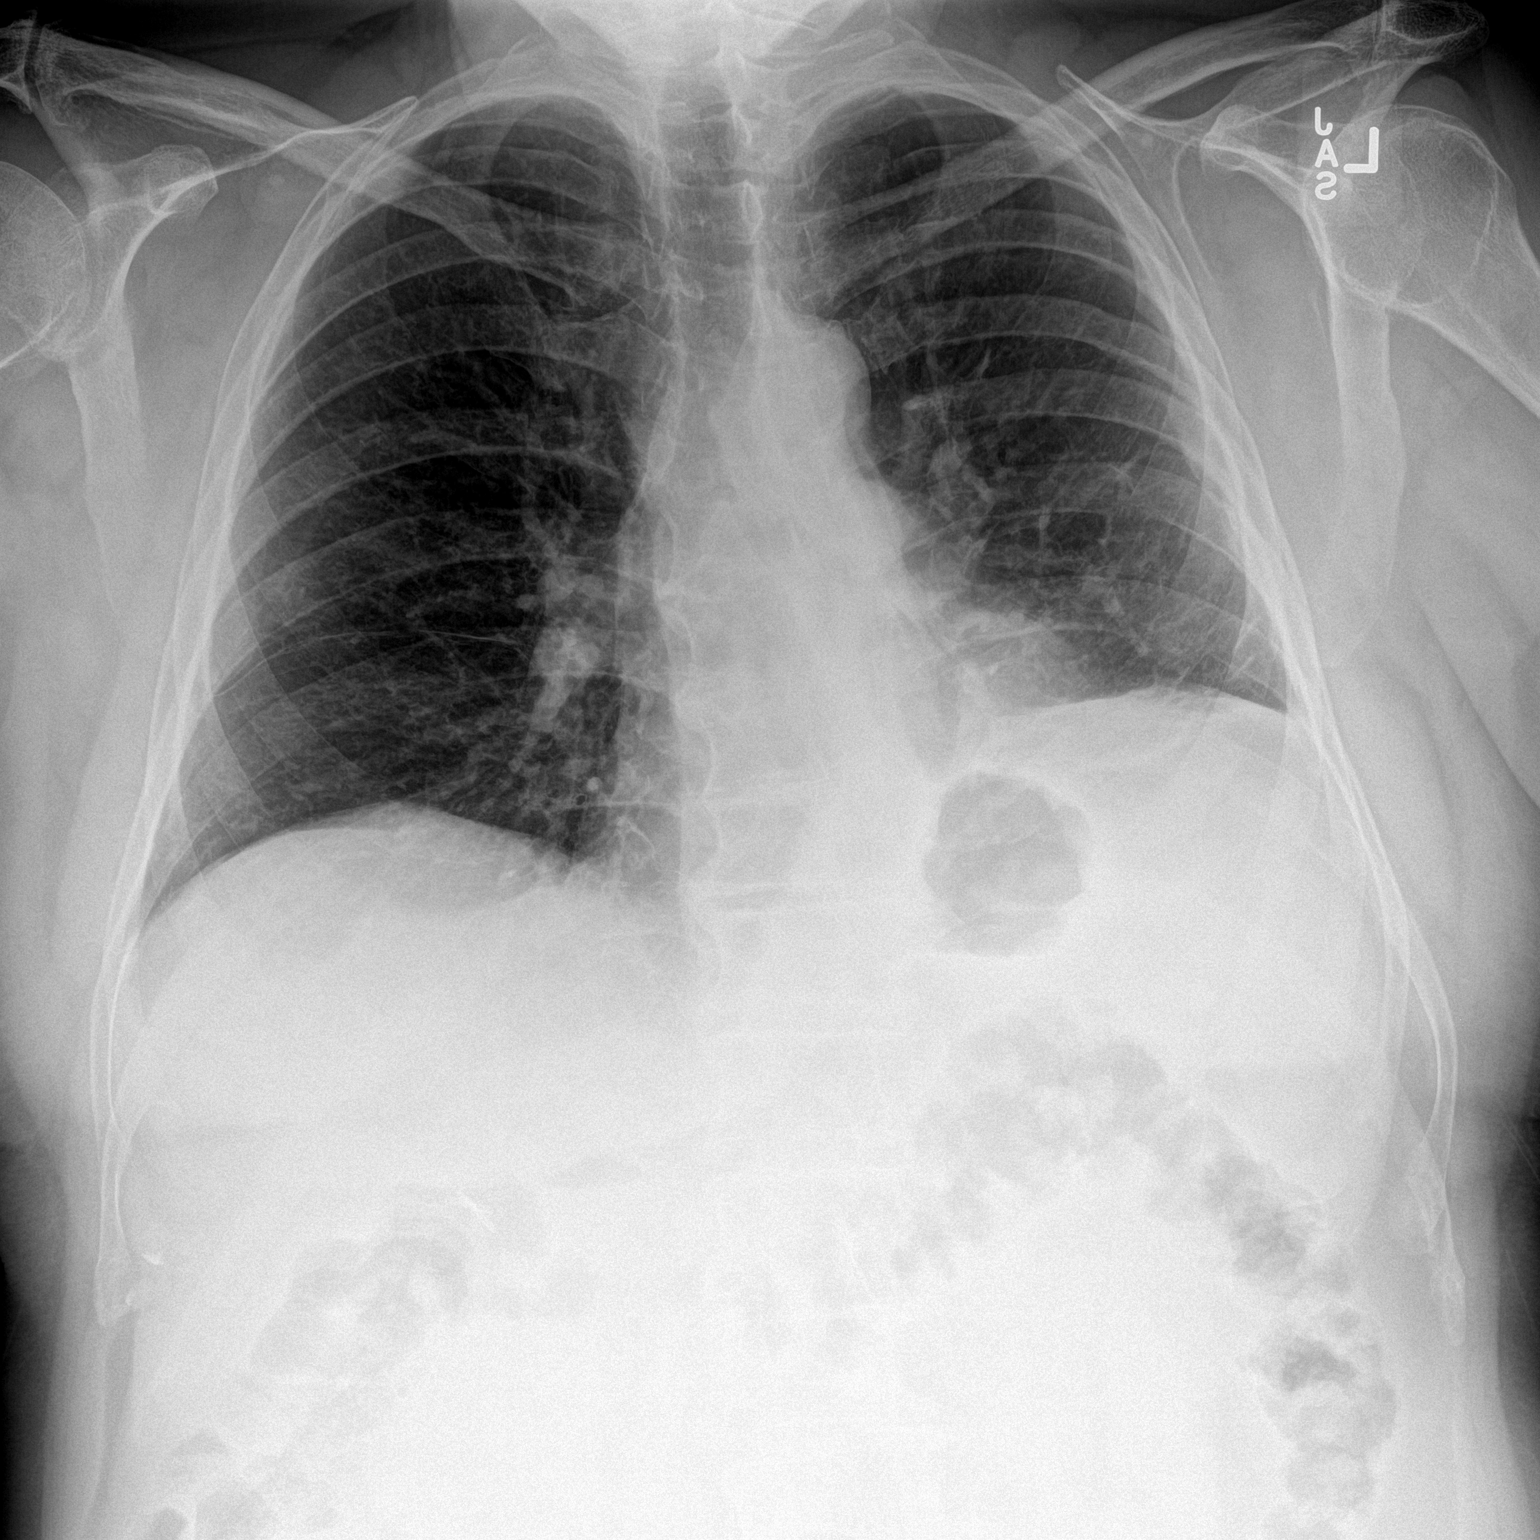

[chest lat]
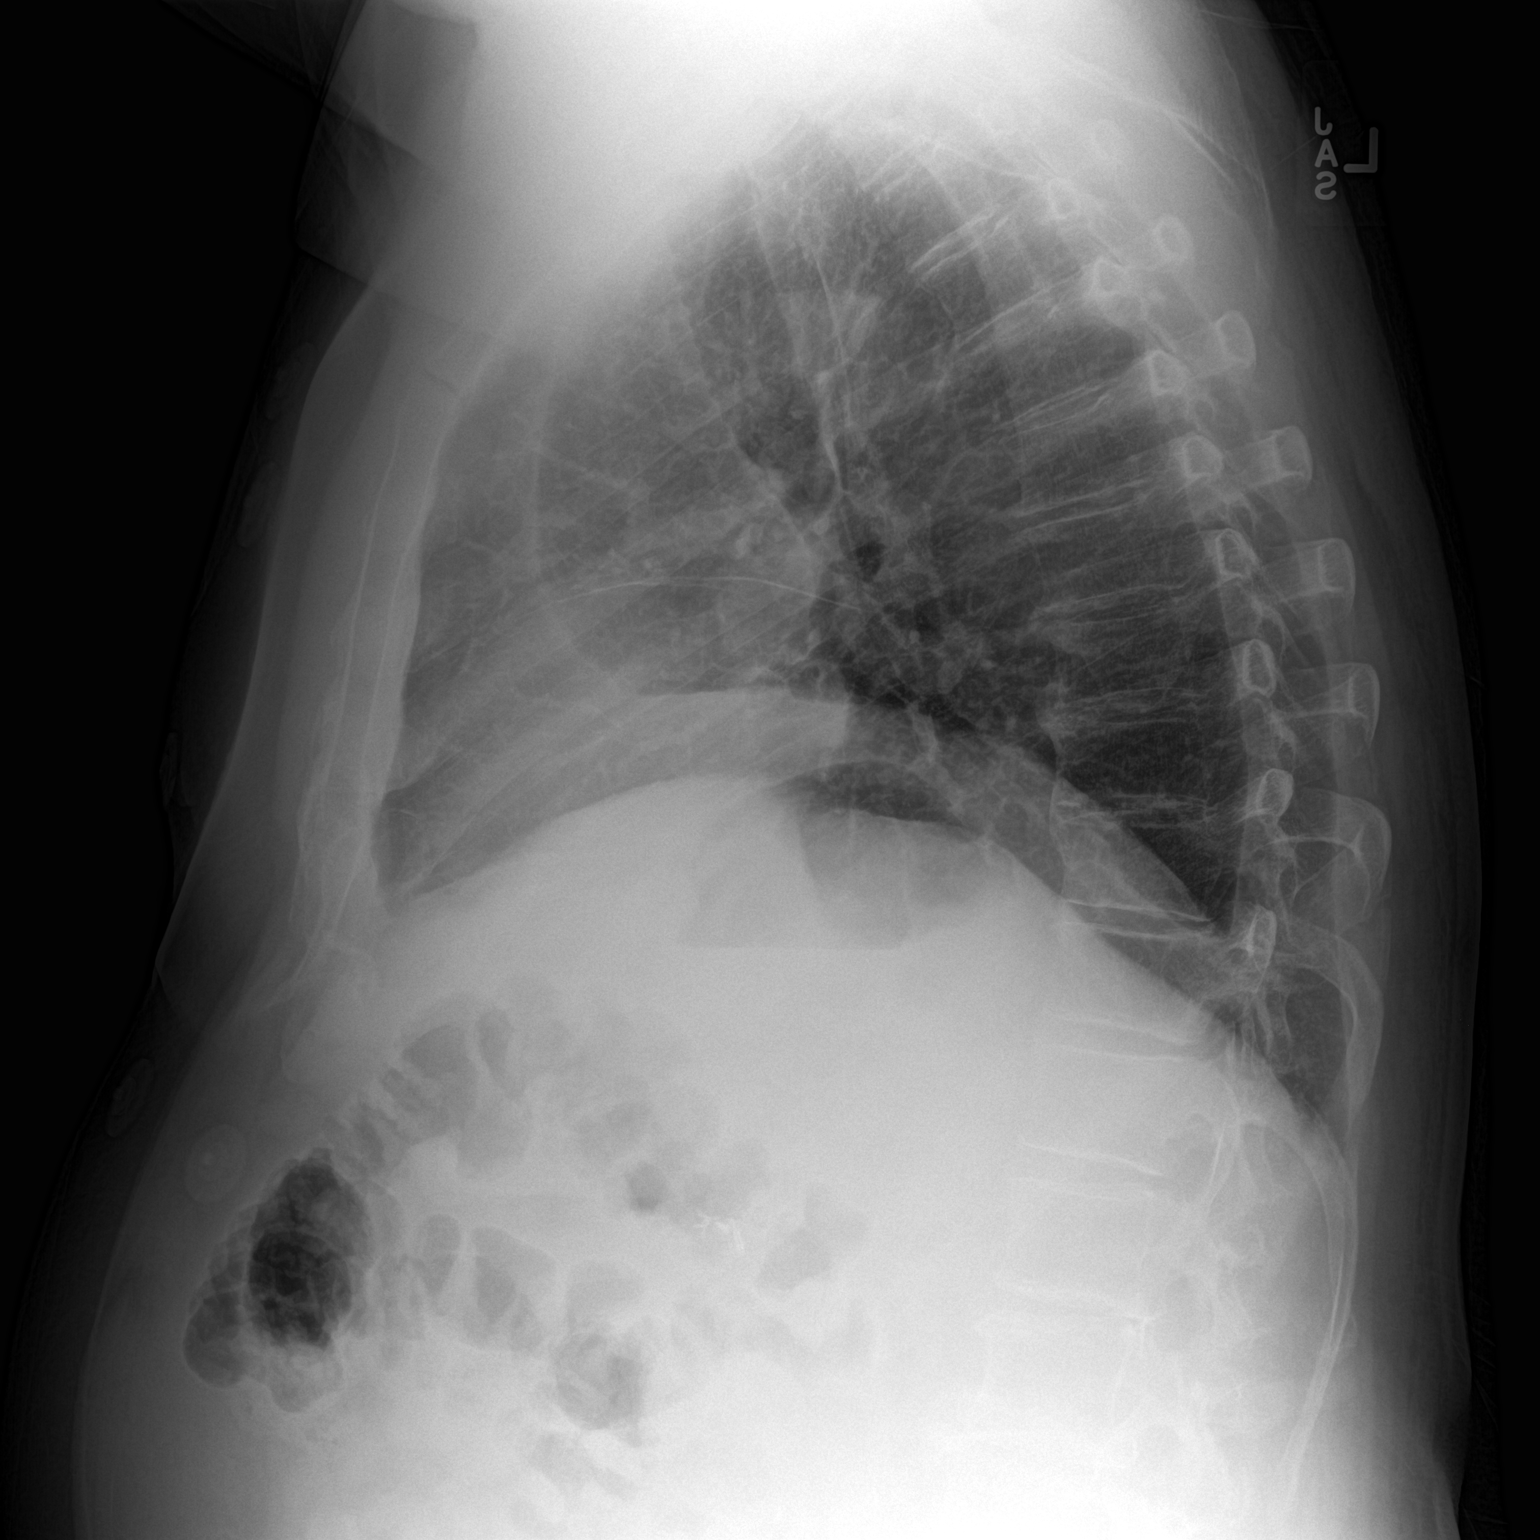

[2 of 2 positions shown; findings below may reference images not displayed]

FINDINGS: The lungs are hypoexpanded. There is elevation of the left
hemidiaphragm, with left basilar atelectasis. There is no evidence
of pleural effusion or pneumothorax.

The heart is normal in size; the mediastinal contour is within
normal limits. No acute osseous abnormalities are seen.

Clips are noted within the right upper quadrant, reflecting prior
cholecystectomy.
IMPRESSION: Lungs hypoexpanded. Elevation of the left hemidiaphragm, with left
basilar atelectasis.

## 2017-06-15 IMAGING — CT CT ABD-PELV W/ CM
1 series · 1 of 1 positions shown · IV contrast (omnipaque)
Comparison: No priors.

CLINICAL DATA: 65-year-old male with right lower quadrant abdominal
pain and tenderness. Epigastric pain.

EXAM:
CT ABDOMEN AND PELVIS WITH CONTRAST
TECHNIQUE: Multidetector CT imaging of the abdomen and pelvis was performed
using the standard protocol following bolus administration of
intravenous contrast.
CONTRAST:  100mL OMNIPAQUE IOHEXOL 300 MG/ML  SOLN

[Series 100: scout · sagittal · 0.6mm · 0.98mm/px · 1 of 1 slices shown]
[im 1/1]
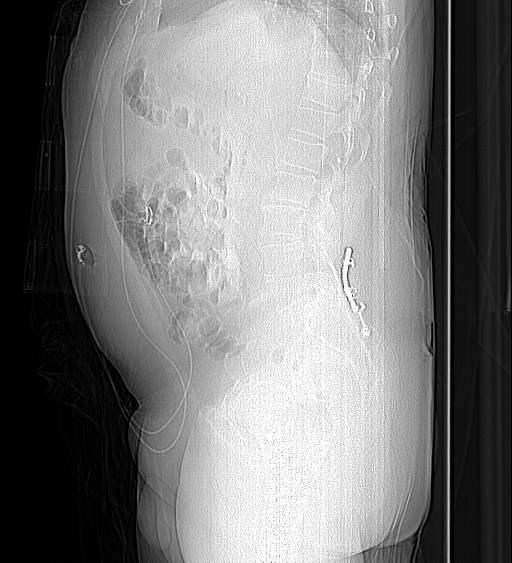

[1 of 1 positions shown; findings below may reference images not displayed]

FINDINGS: Lower chest:  Mild scarring in the lung bases bilaterally.

Hepatobiliary: No cystic or solid hepatic lesions. No intra or
extrahepatic biliary ductal dilatation. Status post cholecystectomy.

Pancreas: No pancreatic mass. No pancreatic ductal dilatation. No
pancreatic or peripancreatic fluid or inflammatory changes.

Spleen: Unremarkable.

Adrenals/Urinary Tract: Bilateral adrenal glands and bilateral
kidneys are normal in appearance. No hydroureteronephrosis. 2.6 cm
diverticulum from the right side of the urinary bladder again noted.
Bilateral adrenal glands are normal in appearance.

Stomach/Bowel: Normal appearance of the stomach. No pathologic
dilatation of small bowel or colon. Normal appendix.

Vascular/Lymphatic: Atherosclerosis throughout the abdominal and
pelvic vasculature, including fusiform aneurysmal dilatation of the
infrarenal abdominal aorta which measures up to 3.2 x 3.2 cm. No
lymphadenopathy noted in the abdomen or pelvis.

Reproductive: Prostate gland and seminal vesicles are unremarkable
in appearance.

Other: No significant volume of ascites.  No pneumoperitoneum.

Musculoskeletal: Posterior fixation device in position extending
from L4-S2. Status post laminectomy at L3. There are no aggressive
appearing lytic or blastic lesions noted in the visualized portions
of the skeleton.
IMPRESSION: 1. No acute findings in the abdomen or pelvis.
2. Specifically, the appendix is normal.
3. Small right-sided bladder wall diverticulum.
4. Atherosclerosis, including a 3.2 cm infrarenal abdominal aortic
aneurysm. Recommend followup by ultrasound in 3 years. This
recommendation follows ACR consensus guidelines: White Paper of the
ACR Incidental Findings Committee II on Vascular Findings. [HOSPITAL] 9732; [DATE].
5. Additional incidental findings, as above.

## 2017-06-15 IMAGING — CT CT HEAD W/O CM
1 series · 16 of 30 positions shown, 20 images · non-contrast
Comparison: CT scan of April 08, 2009.

CLINICAL DATA: Left-sided weakness.

EXAM:
CT HEAD WITHOUT CONTRAST
TECHNIQUE: Contiguous axial images were obtained from the base of the skull
through the vertex without intravenous contrast.

[Series 2: head 5.0 h30s · axial · 0.46mm/px · z∈[-64,+76]mm · 16 of 32 slices shown, 20 images]
[im 2/32  brain]
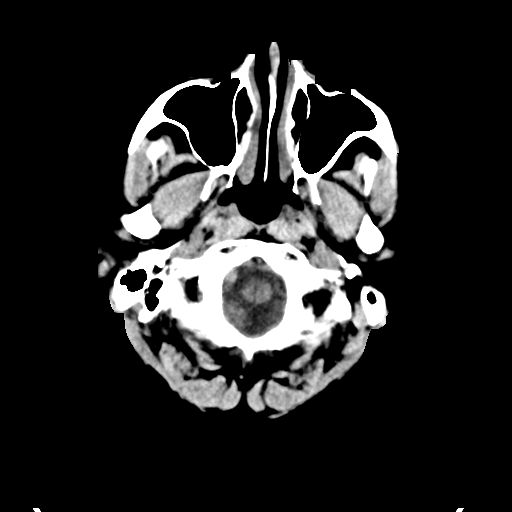
[im 2/32  bone]
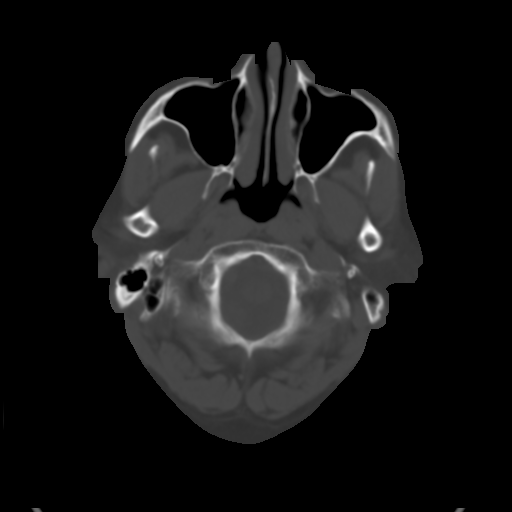
[im 4/32  brain]
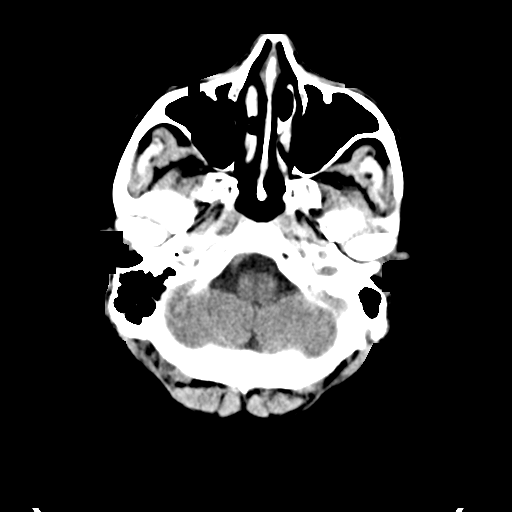
[im 6/32  brain]
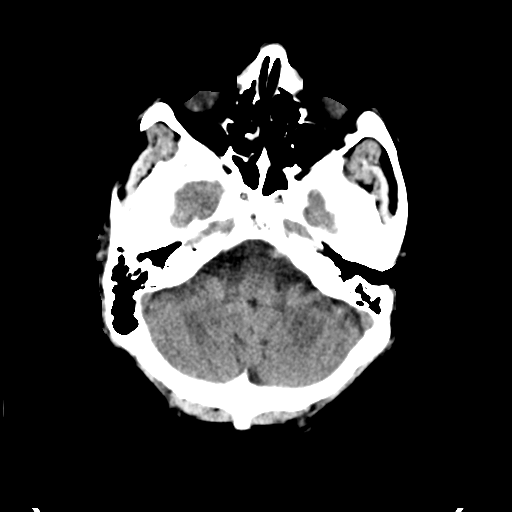
[im 8/32  brain]
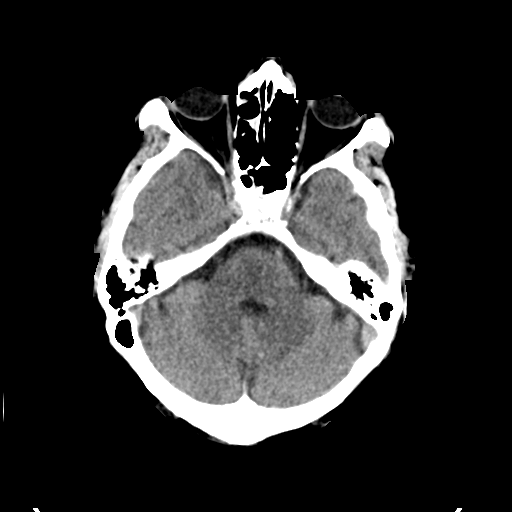
[im 9/32  brain]
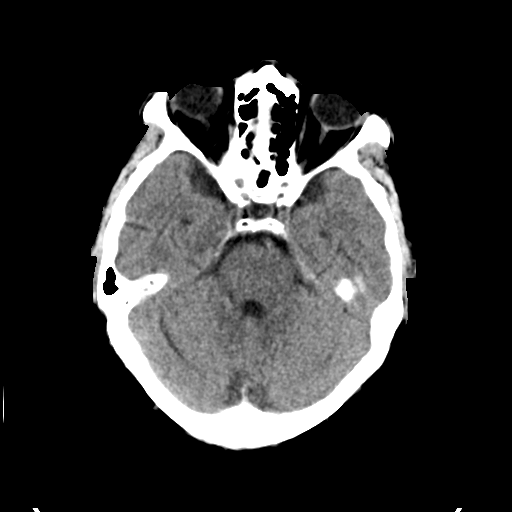
[im 9/32  bone]
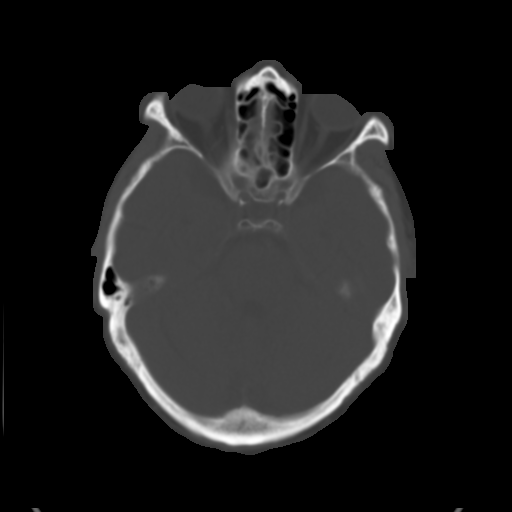
[im 11/32  brain]
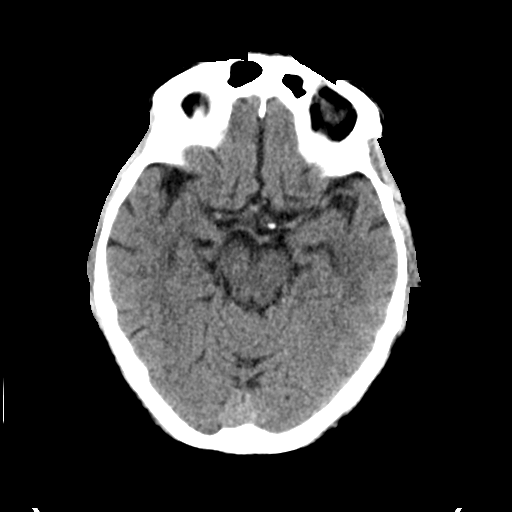
[im 13/32  brain]
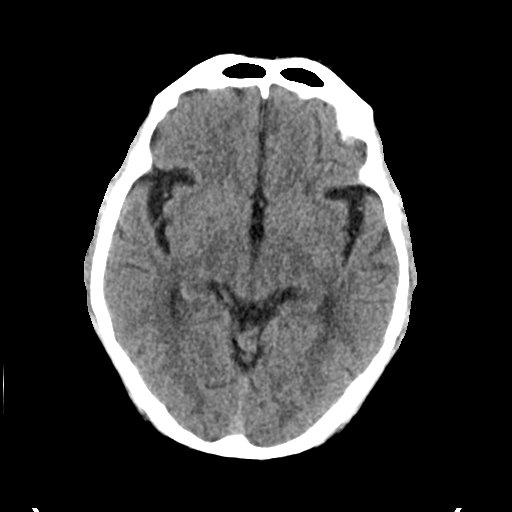
[im 15/32  brain]
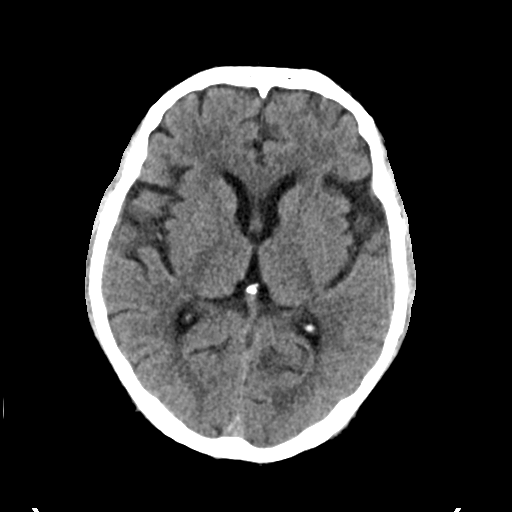
[im 17/32  brain]
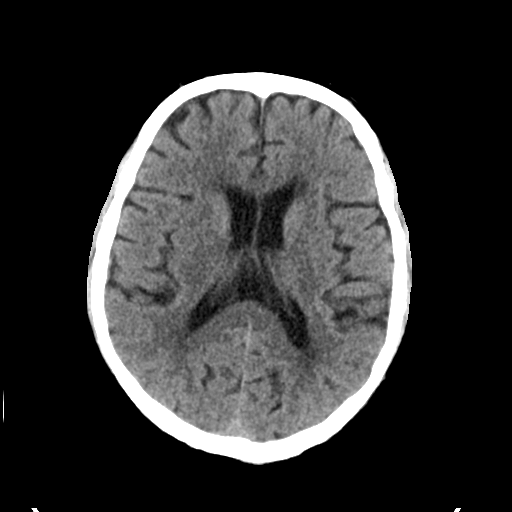
[im 17/32  bone]
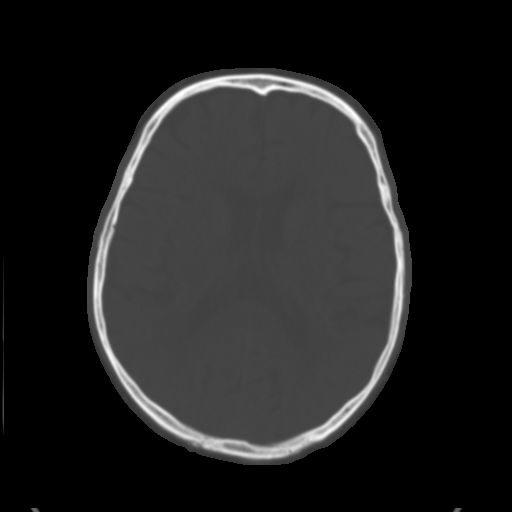
[im 19/32  brain]
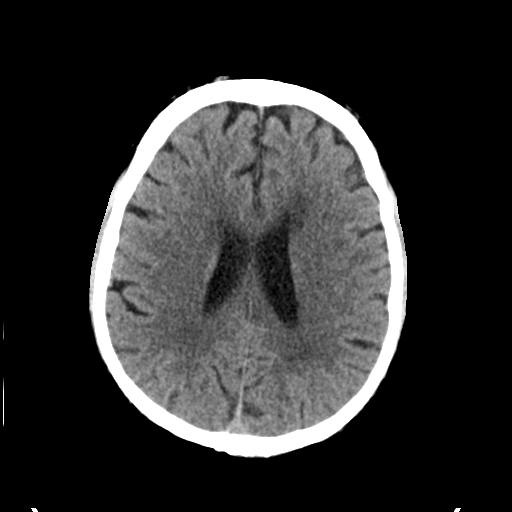
[im 21/32  brain]
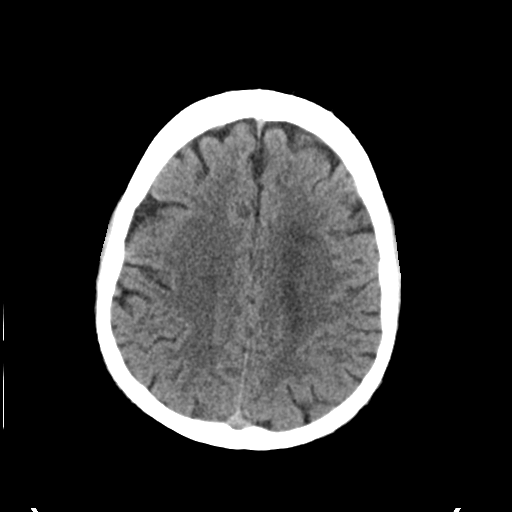
[im 23/32  brain]
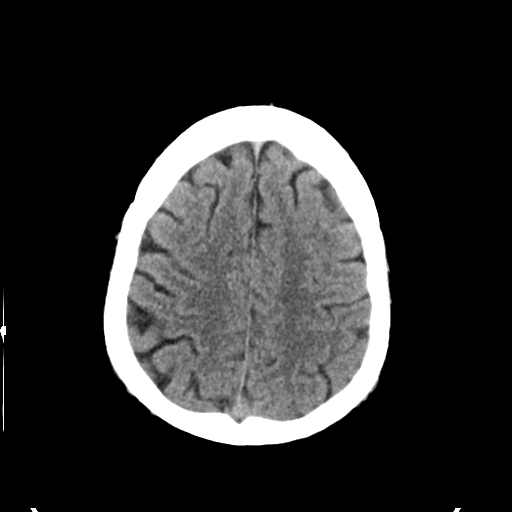
[im 24/32  brain]
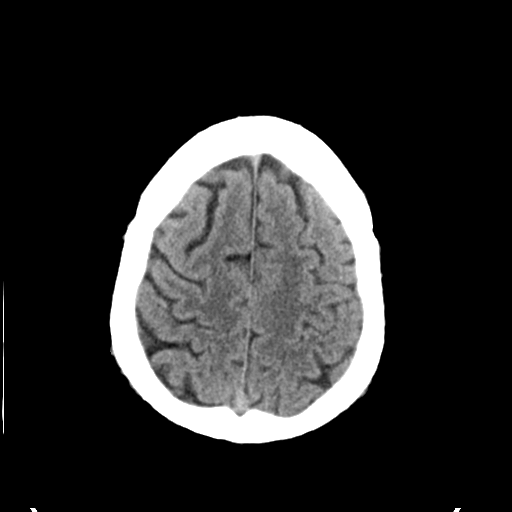
[im 24/32  bone]
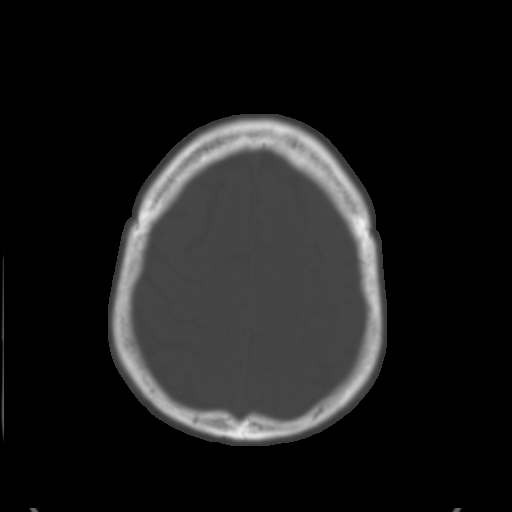
[im 26/32  brain]
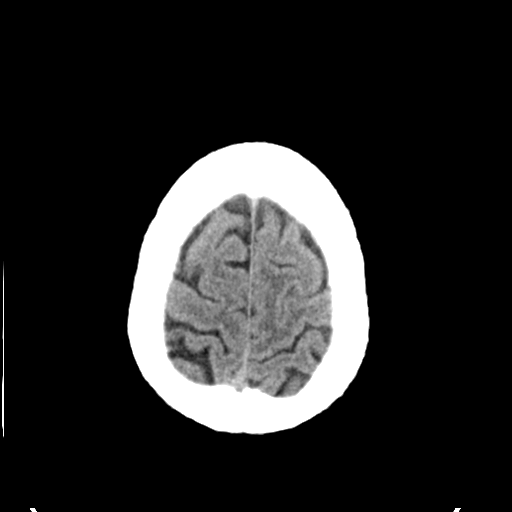
[im 28/32  brain]
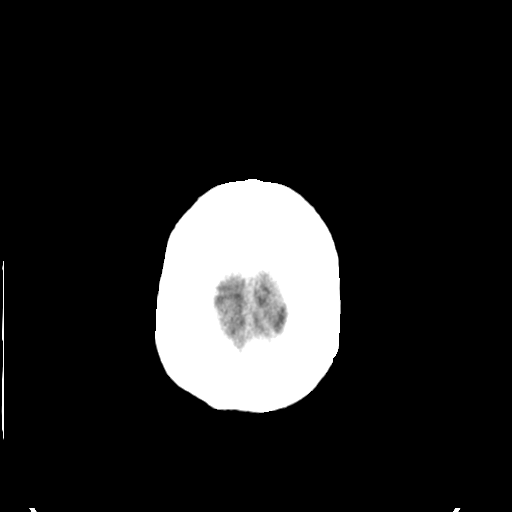
[im 30/32  brain]
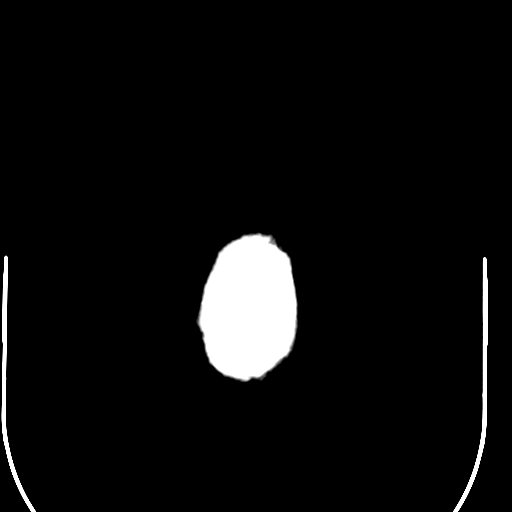

[16 of 30 positions shown; findings below may reference images not displayed]

FINDINGS: Bony calvarium appears intact. Mild chronic ischemic white matter
disease is noted. No mass effect or midline shift is noted.
Ventricular size is within normal limits. There is no evidence of
mass lesion, hemorrhage or acute infarction.
IMPRESSION: Mild chronic ischemic white matter disease. No acute intracranial
abnormality seen.

## 2017-08-05 DIAGNOSIS — M542 Cervicalgia: Secondary | ICD-10-CM | POA: Diagnosis not present

## 2017-08-05 DIAGNOSIS — M48061 Spinal stenosis, lumbar region without neurogenic claudication: Secondary | ICD-10-CM | POA: Diagnosis not present

## 2017-08-05 DIAGNOSIS — M5136 Other intervertebral disc degeneration, lumbar region: Secondary | ICD-10-CM | POA: Diagnosis not present

## 2017-09-11 ENCOUNTER — Encounter (HOSPITAL_COMMUNITY): Payer: Self-pay | Admitting: Emergency Medicine

## 2017-09-11 ENCOUNTER — Emergency Department (HOSPITAL_COMMUNITY)
Admission: EM | Admit: 2017-09-11 | Discharge: 2017-09-12 | Disposition: A | Discharge status: Home / Self Care | Payer: Medicare Other | Attending: Emergency Medicine | Admitting: Emergency Medicine

## 2017-09-11 ENCOUNTER — Emergency Department (HOSPITAL_COMMUNITY): Payer: Medicare Other

## 2017-09-11 ENCOUNTER — Other Ambulatory Visit: Payer: Self-pay

## 2017-09-11 DIAGNOSIS — Z79899 Other long term (current) drug therapy: Secondary | ICD-10-CM | POA: Diagnosis not present

## 2017-09-11 DIAGNOSIS — K5730 Diverticulosis of large intestine without perforation or abscess without bleeding: Secondary | ICD-10-CM | POA: Diagnosis not present

## 2017-09-11 DIAGNOSIS — I714 Abdominal aortic aneurysm, without rupture: Secondary | ICD-10-CM | POA: Insufficient documentation

## 2017-09-11 DIAGNOSIS — R1012 Left upper quadrant pain: Secondary | ICD-10-CM | POA: Diagnosis present

## 2017-09-11 DIAGNOSIS — E119 Type 2 diabetes mellitus without complications: Secondary | ICD-10-CM | POA: Insufficient documentation

## 2017-09-11 DIAGNOSIS — K5900 Constipation, unspecified: Secondary | ICD-10-CM | POA: Diagnosis not present

## 2017-09-11 DIAGNOSIS — R1032 Left lower quadrant pain: Secondary | ICD-10-CM | POA: Diagnosis not present

## 2017-09-11 DIAGNOSIS — R1084 Generalized abdominal pain: Secondary | ICD-10-CM

## 2017-09-11 DIAGNOSIS — R319 Hematuria, unspecified: Secondary | ICD-10-CM | POA: Diagnosis not present

## 2017-09-11 DIAGNOSIS — Z87891 Personal history of nicotine dependence: Secondary | ICD-10-CM | POA: Insufficient documentation

## 2017-09-11 LAB — CBC
HCT: 41.6 % (ref 39.0–52.0)
HEMOGLOBIN: 13.5 g/dL (ref 13.0–17.0)
MCH: 27.4 pg (ref 26.0–34.0)
MCHC: 32.5 g/dL (ref 30.0–36.0)
MCV: 84.6 fL (ref 78.0–100.0)
Platelets: 236 10*3/uL (ref 150–400)
RBC: 4.92 MIL/uL (ref 4.22–5.81)
RDW: 16.6 % — ABNORMAL HIGH (ref 11.5–15.5)
WBC: 8.1 10*3/uL (ref 4.0–10.5)

## 2017-09-11 LAB — COMPREHENSIVE METABOLIC PANEL
ALK PHOS: 132 U/L — AB (ref 38–126)
ALT: 15 U/L — ABNORMAL LOW (ref 17–63)
ANION GAP: 10 (ref 5–15)
AST: 17 U/L (ref 15–41)
Albumin: 3.8 g/dL (ref 3.5–5.0)
BUN: 12 mg/dL (ref 6–20)
CO2: 24 mmol/L (ref 22–32)
Calcium: 9.5 mg/dL (ref 8.9–10.3)
Chloride: 104 mmol/L (ref 101–111)
Creatinine, Ser: 1.11 mg/dL (ref 0.61–1.24)
GFR calc non Af Amer: >60 mL/min (ref >60)
Glucose, Bld: 129 mg/dL — ABNORMAL HIGH (ref 65–99)
Potassium: 4.3 mmol/L (ref 3.5–5.1)
SODIUM: 138 mmol/L (ref 135–145)
TOTAL PROTEIN: 6.7 g/dL (ref 6.5–8.1)
Total Bilirubin: 0.8 mg/dL (ref 0.3–1.2)

## 2017-09-11 LAB — URINALYSIS, ROUTINE W REFLEX MICROSCOPIC
Bilirubin Urine: NEGATIVE
Glucose, UA: NEGATIVE mg/dL
Hgb urine dipstick: NEGATIVE
Ketones, ur: NEGATIVE mg/dL
LEUKOCYTES UA: NEGATIVE
NITRITE: NEGATIVE
Protein, ur: NEGATIVE mg/dL
SPECIFIC GRAVITY, URINE: 1.014 (ref 1.005–1.030)
pH: 5 (ref 5.0–8.0)

## 2017-09-11 LAB — LIPASE, BLOOD: Lipase: 25 U/L (ref 11–51)

## 2017-09-11 MED ORDER — ACETAMINOPHEN 500 MG PO TABS
1000.0000 mg | ORAL_TABLET | Freq: Once | ORAL | Status: AC
  Administered 2017-09-11: 1000 mg via ORAL
  Filled 2017-09-11: qty 2

## 2017-09-11 MED ORDER — IOPAMIDOL (ISOVUE-300) INJECTION 61%
INTRAVENOUS | Status: AC
  Filled 2017-09-11: qty 100

## 2017-09-11 MED ORDER — IOPAMIDOL (ISOVUE-300) INJECTION 61%
100.0000 mL | Freq: Once | INTRAVENOUS | Status: AC | PRN
  Administered 2017-09-11: 100 mL via INTRAVENOUS

## 2017-09-11 NOTE — ED Provider Notes (Signed)
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 He also endorses left-sided mid-back pain.  He states that he had an episode of "red, muddy urine" 3-4 days ago that has since resolved. He states "my urine flow just isn't right."  He denies dysuria, chest pain, dyspnea, fever, chills, nausea, vomiting, or diarrhea.  He was diagnosed and treated for a PE in 2017. No precipitating factors.  States that he took himself off of anticoagulation because his neurosurgeon would not do surgery on his neck because he was taking a blood thinner.  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  Right lower quadrant is nontender to palpation.  He has left CVA tenderness.  No right CVA tenderness.  No rebound tenderness.  Abdomen is soft, nondistended.  No bruits or palpable abdominal masses.   Initiation of care has begun. The patient has been counseled on the process, plan, and necessity for staying for the completion/evaluation, and the remainder of the medical screening examination    Joanne Gavel, PA-C 09/11/17 1623    Dorie Rank, MD 09/12/17 1236

## 2017-09-11 NOTE — ED Triage Notes (Signed)
Pt c/o abdominal pain and constipation. LBM yesterday. Tenderness to the LUQ. Denies nausea/vomiting.

## 2017-09-11 NOTE — ED Notes (Signed)
Pt. Was sent to [place] from [facility] for further Evaluation of abdominal Pain.  [phone]

## 2017-09-11 NOTE — ED Provider Notes (Signed)
[facility] EMERGENCY DEPARTMENT Provider Note   CSN: [id number] Arrival date & time: 09/11/17  1419     History   Chief Complaint Chief Complaint  Patient presents with  . Abdominal Pain    HPI Harold Morris is a 67 y.o. male with a history of diabetes, PE, lung cancer, DM 2, AAA, who presents today for evaluation of abdominal pain and constipation.  He reports that for the past few years he has been having trouble with constipation.  He reports that he has been seen for this at [facility] and is taking "little red pills."  Without any improvement.  He denies fevers or chills.  No nausea vomiting or diarrhea.  He reports pain generally in his abdomen.  No recent illness or sickness.  Called his PCP at [facility] today who told him to go to the hospital for evaluation.    Patient is a poor historian.   HPI  Past Medical History:  Diagnosis Date  . Diabetes mellitus without complication ([place])   . Fractured patella   . Gout   . High cholesterol   . Low back pain   . Lung cancer ([place])   . Neck pain   . Pulmonary embolism [facility])     Patient Active Problem List   Diagnosis Date Noted  . Chronic diarrhea 02/18/2015  . Hip pain 01/18/2015  . Acute pulmonary embolism ([place]) 01/01/2015  . DM type 2 (diabetes mellitus, type 2) ([place]) 01/01/2015  . Gout 01/01/2015  . History of lung cancer 01/01/2015    Past Surgical History:  Procedure Laterality Date  . BACK SURGERY     x4  . CHOLECYSTECTOMY    . JOINT REPLACEMENT Left    Knee  . LUNG CANCER SURGERY Left 06/2009        Home Medications    Prior to Admission medications   Medication Sig Start Date End Date Taking? Authorizing Provider  allopurinol (ZYLOPRIM) 300 MG tablet Take 300 mg by mouth daily.    [provider]  atorvastatin (LIPITOR) 80 MG tablet Take 80 mg by mouth daily.    [provider]  Cyanocobalamin (VITAMIN B-12 CR) 1000 MCG TBCR Take 1,000 mcg by mouth daily.     [provider]  docusate sodium (COLACE) 100 MG capsule Per bottle as needed    [provider]  Eluxadoline (VIBERZI) 75 MG TABS Take 75 mg by mouth 2 (two) times daily. 02/18/15   Golden Circle, FNP  ERGOCALCIFEROL PO Take 1 tablet by mouth daily.    [provider]  fenofibrate (TRICOR) 145 MG tablet Take 145 mg by mouth daily.    [provider]  fluocinonide cream (LIDEX) 0.05 % Apply as needed    [provider]  gabapentin (NEURONTIN) 600 MG tablet Take 600-900 mg by mouth See admin instructions. Takes 2 caps in am and 3 caps at bedtime      HYDROcodone-acetaminophen (NORCO) 10-325 MG per tablet Take 1 tablet by mouth every 6 (six) hours as needed for moderate pain.    [provider]  magnesium oxide (MAG-OX) 400 MG tablet Take 400 mg by mouth 2 (two) times daily.    [provider]  meclizine (ANTIVERT) 25 MG tablet Take 25 mg by mouth daily as needed for dizziness.     [provider]  methocarbamol (ROBAXIN) 500 MG tablet Take 1 tablet (500 mg total) by mouth 2 (two) times daily as needed for  muscle spasms. 01/09/15   Waynetta Pean, PA-C  omeprazole (PRILOSEC) 20 MG capsule Take 20 mg by mouth daily.    [provider]  sertraline (ZOLOFT) 100 MG tablet Take 100 mg by mouth daily.    [provider]  triamcinolone cream (KENALOG) 0.1 % Apply as needed    [provider]    Family History Family History  Problem Relation Age of Onset  . Diabetes Mother   . Cancer Brother   . Cancer Sister   . Cancer - Prostate Brother     Social History Social History   Tobacco Use  . Smoking status: Former Smoker    Packs/day: 0.25    Years: 4.00    Pack years: 1.00    Types: Cigarettes  . Smokeless tobacco: Current User    Types: Snuff  . Tobacco comment: started smoking when he was in the [place] War, also did chewing tobacco  Substance Use Topics  . Alcohol use: Yes     Alcohol/week: 0.0 oz    Comment: Rarely  . Drug use: No     Allergies   Epinephrine; Sulfa antibiotics; Oxycontin [oxycodone hcl]; and Morphine   Review of Systems Review of Systems  Constitutional: Negative for chills and fever.  HENT: Negative for congestion.   Respiratory: Negative for chest tightness and shortness of breath.   Gastrointestinal: Positive for abdominal pain and constipation. Negative for abdominal distention, nausea and vomiting.  Genitourinary: Positive for hematuria. Negative for dysuria and urgency.  Musculoskeletal: Negative for arthralgias and neck pain.  Skin: Negative for color change and wound.  Neurological: Negative for dizziness and weakness.  All other systems reviewed and are negative.    Physical Exam Updated Vital Signs BP 120/69 (BP Location: Right Arm)   Pulse 67   Temp 97.7 F (36.5 C) (Oral)   Resp 18   Ht 5' 9.5" (1.765 m)   Wt 109.8 kg (242 lb)   SpO2 97%   BMI 35.22 kg/m   Physical Exam  Constitutional: He is oriented to person, place, and time. He appears well-developed and well-nourished. No distress.  HENT:  Head: Normocephalic and atraumatic.  Eyes: Conjunctivae are normal.  Neck: Neck supple.  Cardiovascular: Normal rate, regular rhythm, normal heart sounds and intact distal pulses.  No murmur heard. Pulmonary/Chest: Effort normal and breath sounds normal. No respiratory distress.  Abdominal: Soft. Normal appearance. Bowel sounds are decreased. There is generalized tenderness. There is no rigidity, no rebound and no guarding.  Musculoskeletal: He exhibits no edema.  Neurological: He is alert and oriented to person, place, and time.  Skin: Skin is warm and dry.  Psychiatric: He has a normal mood and affect.  Nursing note and vitals reviewed.    ED Treatments / Results  Labs (all labs ordered are listed, but only abnormal results are displayed) Labs Reviewed  COMPREHENSIVE METABOLIC PANEL - Abnormal; Notable for  the following components:      Result Value   Glucose, Bld 129 (*)    ALT 15 (*)    Alkaline Phosphatase 132 (*)    All other components within normal limits  CBC - Abnormal; Notable for the following components:   RDW 16.6 (*)    All other components within normal limits  URINALYSIS, ROUTINE W REFLEX MICROSCOPIC - Abnormal; Notable for the following components:   APPearance HAZY (*)    All other components within normal limits  URINE CULTURE  LIPASE, BLOOD    EKG None  Radiology  Ct Abdomen Pelvis W Contrast  Result Date: 09/11/2017 CLINICAL DATA:  LEFT upper quadrant pain.  Pain for 24 hours EXAM: CT ABDOMEN AND PELVIS WITH CONTRAST TECHNIQUE: Multidetector CT imaging of the abdomen and pelvis was performed using the standard protocol following bolus administration of intravenous contrast. CONTRAST:  151mL ISOVUE-300 IOPAMIDOL (ISOVUE-300) INJECTION 61% COMPARISON:  01/09/2015 FINDINGS: Lower chest: Lung bases are clear. Hepatobiliary: No focal hepatic lesion. Postcholecystectomy. No biliary dilatation. Pancreas: Pancreas is normal. No ductal dilatation. No pancreatic inflammation. Spleen: Normal spleen Adrenals/urinary tract: Adrenal glands and kidneys are normal. The ureters and bladder normal. Small diverticulum along the posterior RIGHT aspect bladder Stomach/Bowel: Stomach, small bowel, appendix, and cecum are normal. There multiple diverticular the descending colon without acute inflammation. Vascular/Lymphatic: New eccentric aneurysmal dilatation of the infrarenal abdominal aorta to 40 mm x 38 mm. Small intimal flap within the eccentric clot (image 48/4). No evidence of acute inflammation. No retroperitoneal periportal adenopathy. No pelvic lymphadenopathy. Reproductive: Prostate normal Other: No free fluid. Musculoskeletal: No aggressive osseous lesion. IMPRESSION: 1. LEFT colon diverticulosis without evidence acute diverticulitis. 2. 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 No acute findings evident. Recommend followup by ultrasound in 1 year. This recommendation follows ACR consensus guidelines: White Paper of the ACR Incidental Findings Committee II on Vascular Findings. J Am Coll Radiol 2013; 10:789-794. If persistent or increasing abdominal pain consider CTA abdomen. 3. Electronically Signed   By: Suzy Bouchard M.D.   On: 09/11/2017 22:33    Procedures Procedures (including critical care time)  Medications Ordered in ED Medications  iopamidol (ISOVUE-300) 61 % injection (has no administration in time range)  acetaminophen (TYLENOL) tablet 1,000 mg (1,000 mg Oral Given 09/11/17 1610)  iopamidol (ISOVUE-300) 61 % injection 100 mL (100 mLs Intravenous Contrast Given 09/11/17 2200)     Initial Impression / Assessment and Plan / ED Course  I have reviewed the triage vital signs and the nursing notes.  Pertinent labs & imaging results that were available during my care of the patient were reviewed by me and considered in my medical decision making (see chart for details).    Patient is nontoxic, nonseptic appearing, in no apparent distress.  Patient's pain and other symptoms adequately managed in emergency department.  Fluid bolus given.  Labs, imaging and vitals reviewed.  Patient does not meet the SIRS or Sepsis criteria.  On repeat exam patient does not have a surgical abdomen and there are no peritoneal signs.  No indication of appendicitis, bowel obstruction, bowel perforation, cholecystitis, diverticulitis.  Patient does have diverticulosis without evidence of diverticulitis.  Patient has a AAA on CT scan.  This is known.  Patient reports that he is not following up with anyone for this and was recommended that he follow-up with vascular for this.  No evidence of acute dissection or rupture.  Patient discharged home with symptomatic treatment and given strict instructions for follow-up with their primary care physician.   I have also discussed reasons to return immediately to the ER.  Patient expresses understanding and agrees with plan.  Final Clinical Impressions(s) / ED Diagnoses   Final diagnoses:  Generalized abdominal pain    ED Discharge Orders    None       Ollen Gross 09/12/17 0123    Dorie Rank, MD 09/12/17 1228

## 2017-09-11 NOTE — ED Notes (Signed)
Pt was unable to urinate.

## 2017-09-12 NOTE — Discharge Instructions (Addendum)
Your CT scan showed diverticulosis without evidence of diverticulitis.  The CT scan also showed your aortic aneurysm.  Please make sure that you follow-up with vascular surgery about this.  Please follow-up with your primary care provider about the diverticulosis.  They recommend follow up ultrasound in 1 year for your AAA.

## 2017-09-13 LAB — URINE CULTURE: Culture: <10000 — AB

## 2017-09-23 ENCOUNTER — Telehealth: Payer: Self-pay | Admitting: Surgery

## 2017-09-23 NOTE — Telephone Encounter (Signed)
Sched appt 03/24/18; lab at 8:00 and MD at 9:00. Mailed new pt ppw.

## 2017-09-23 NOTE — Telephone Encounter (Signed)
-----   Message from Mena Goes, RN sent at 09/23/2017 11:13 AM EDT ----- Regarding: new patient appt in 6 months   ----- Message ----- From: Serafina Mitchell, MD Sent: 09/22/2017   8:45 PM To: Vvs Charge Pool  Patient needs a new patietn appointment in 6 months with AAA u/s

## 2017-09-25 ENCOUNTER — Other Ambulatory Visit: Payer: Self-pay

## 2017-09-25 DIAGNOSIS — I714 Abdominal aortic aneurysm, without rupture, unspecified: Secondary | ICD-10-CM

## 2018-02-17 DIAGNOSIS — L539 Erythematous condition, unspecified: Secondary | ICD-10-CM | POA: Diagnosis not present

## 2018-02-17 DIAGNOSIS — T63441A Toxic effect of venom of bees, accidental (unintentional), initial encounter: Secondary | ICD-10-CM | POA: Diagnosis not present

## 2018-03-24 ENCOUNTER — Encounter: Payer: Medicare Other | Admitting: Surgery

## 2018-03-24 ENCOUNTER — Other Ambulatory Visit (HOSPITAL_COMMUNITY): Payer: Medicare Other

## 2018-04-30 DIAGNOSIS — G4730 Sleep apnea, unspecified: Secondary | ICD-10-CM | POA: Diagnosis not present

## 2018-04-30 DIAGNOSIS — Z96652 Presence of left artificial knee joint: Secondary | ICD-10-CM | POA: Diagnosis not present

## 2018-04-30 DIAGNOSIS — K5900 Constipation, unspecified: Secondary | ICD-10-CM | POA: Diagnosis not present

## 2018-04-30 DIAGNOSIS — M48061 Spinal stenosis, lumbar region without neurogenic claudication: Secondary | ICD-10-CM | POA: Diagnosis not present

## 2018-04-30 DIAGNOSIS — Z7982 Long term (current) use of aspirin: Secondary | ICD-10-CM | POA: Diagnosis not present

## 2018-04-30 DIAGNOSIS — E785 Hyperlipidemia, unspecified: Secondary | ICD-10-CM | POA: Diagnosis not present

## 2018-04-30 DIAGNOSIS — E119 Type 2 diabetes mellitus without complications: Secondary | ICD-10-CM | POA: Diagnosis not present

## 2018-04-30 DIAGNOSIS — Z9181 History of falling: Secondary | ICD-10-CM | POA: Diagnosis not present

## 2018-04-30 DIAGNOSIS — G4739 Other sleep apnea: Secondary | ICD-10-CM | POA: Diagnosis not present

## 2018-04-30 DIAGNOSIS — M546 Pain in thoracic spine: Secondary | ICD-10-CM | POA: Diagnosis not present

## 2018-04-30 DIAGNOSIS — M62838 Other muscle spasm: Secondary | ICD-10-CM | POA: Diagnosis not present

## 2018-04-30 DIAGNOSIS — G4733 Obstructive sleep apnea (adult) (pediatric): Secondary | ICD-10-CM | POA: Diagnosis not present

## 2018-04-30 DIAGNOSIS — M810 Age-related osteoporosis without current pathological fracture: Secondary | ICD-10-CM | POA: Diagnosis not present

## 2018-04-30 DIAGNOSIS — Z9989 Dependence on other enabling machines and devices: Secondary | ICD-10-CM | POA: Diagnosis not present

## 2018-04-30 DIAGNOSIS — M1990 Unspecified osteoarthritis, unspecified site: Secondary | ICD-10-CM | POA: Diagnosis not present

## 2018-04-30 DIAGNOSIS — G309 Alzheimer's disease, unspecified: Secondary | ICD-10-CM | POA: Diagnosis not present

## 2018-04-30 DIAGNOSIS — W19XXXD Unspecified fall, subsequent encounter: Secondary | ICD-10-CM | POA: Diagnosis not present

## 2018-04-30 DIAGNOSIS — I714 Abdominal aortic aneurysm, without rupture: Secondary | ICD-10-CM | POA: Diagnosis not present

## 2018-04-30 DIAGNOSIS — E559 Vitamin D deficiency, unspecified: Secondary | ICD-10-CM | POA: Diagnosis not present

## 2018-04-30 DIAGNOSIS — M792 Neuralgia and neuritis, unspecified: Secondary | ICD-10-CM | POA: Diagnosis not present

## 2018-04-30 DIAGNOSIS — Z7984 Long term (current) use of oral hypoglycemic drugs: Secondary | ICD-10-CM | POA: Diagnosis not present

## 2018-04-30 DIAGNOSIS — S22080D Wedge compression fracture of T11-T12 vertebra, subsequent encounter for fracture with routine healing: Secondary | ICD-10-CM | POA: Diagnosis not present

## 2018-04-30 DIAGNOSIS — L299 Pruritus, unspecified: Secondary | ICD-10-CM | POA: Diagnosis not present

## 2018-05-04 DIAGNOSIS — M1990 Unspecified osteoarthritis, unspecified site: Secondary | ICD-10-CM | POA: Diagnosis not present

## 2018-05-04 DIAGNOSIS — E119 Type 2 diabetes mellitus without complications: Secondary | ICD-10-CM | POA: Diagnosis not present

## 2018-05-04 DIAGNOSIS — G4739 Other sleep apnea: Secondary | ICD-10-CM | POA: Diagnosis not present

## 2018-05-04 DIAGNOSIS — S22080D Wedge compression fracture of T11-T12 vertebra, subsequent encounter for fracture with routine healing: Secondary | ICD-10-CM | POA: Diagnosis not present

## 2018-05-04 DIAGNOSIS — M810 Age-related osteoporosis without current pathological fracture: Secondary | ICD-10-CM | POA: Diagnosis not present

## 2018-05-10 DIAGNOSIS — E119 Type 2 diabetes mellitus without complications: Secondary | ICD-10-CM | POA: Diagnosis not present

## 2018-05-10 DIAGNOSIS — Z9181 History of falling: Secondary | ICD-10-CM | POA: Diagnosis not present

## 2018-05-10 DIAGNOSIS — G309 Alzheimer's disease, unspecified: Secondary | ICD-10-CM | POA: Diagnosis not present

## 2018-05-10 DIAGNOSIS — E785 Hyperlipidemia, unspecified: Secondary | ICD-10-CM | POA: Diagnosis not present

## 2018-05-10 DIAGNOSIS — S22080D Wedge compression fracture of T11-T12 vertebra, subsequent encounter for fracture with routine healing: Secondary | ICD-10-CM | POA: Diagnosis not present

## 2018-05-10 DIAGNOSIS — Z7984 Long term (current) use of oral hypoglycemic drugs: Secondary | ICD-10-CM | POA: Diagnosis not present

## 2018-05-10 DIAGNOSIS — Z7982 Long term (current) use of aspirin: Secondary | ICD-10-CM | POA: Diagnosis not present

## 2018-05-10 DIAGNOSIS — Z96652 Presence of left artificial knee joint: Secondary | ICD-10-CM | POA: Diagnosis not present

## 2018-05-10 DIAGNOSIS — M810 Age-related osteoporosis without current pathological fracture: Secondary | ICD-10-CM | POA: Diagnosis not present

## 2018-05-10 DIAGNOSIS — E559 Vitamin D deficiency, unspecified: Secondary | ICD-10-CM | POA: Diagnosis not present

## 2018-05-10 DIAGNOSIS — M48061 Spinal stenosis, lumbar region without neurogenic claudication: Secondary | ICD-10-CM | POA: Diagnosis not present

## 2018-05-10 DIAGNOSIS — M1990 Unspecified osteoarthritis, unspecified site: Secondary | ICD-10-CM | POA: Diagnosis not present

## 2018-05-10 DIAGNOSIS — G4733 Obstructive sleep apnea (adult) (pediatric): Secondary | ICD-10-CM | POA: Diagnosis not present

## 2018-05-20 DIAGNOSIS — E119 Type 2 diabetes mellitus without complications: Secondary | ICD-10-CM | POA: Diagnosis not present

## 2018-05-20 DIAGNOSIS — E785 Hyperlipidemia, unspecified: Secondary | ICD-10-CM | POA: Diagnosis not present

## 2018-05-20 DIAGNOSIS — M1990 Unspecified osteoarthritis, unspecified site: Secondary | ICD-10-CM | POA: Diagnosis not present

## 2018-05-20 DIAGNOSIS — M810 Age-related osteoporosis without current pathological fracture: Secondary | ICD-10-CM | POA: Diagnosis not present

## 2018-05-20 DIAGNOSIS — E559 Vitamin D deficiency, unspecified: Secondary | ICD-10-CM | POA: Diagnosis not present

## 2018-05-20 DIAGNOSIS — G309 Alzheimer's disease, unspecified: Secondary | ICD-10-CM | POA: Diagnosis not present

## 2018-05-20 DIAGNOSIS — Z9181 History of falling: Secondary | ICD-10-CM | POA: Diagnosis not present

## 2018-05-20 DIAGNOSIS — S22080D Wedge compression fracture of T11-T12 vertebra, subsequent encounter for fracture with routine healing: Secondary | ICD-10-CM | POA: Diagnosis not present

## 2018-05-20 DIAGNOSIS — Z7982 Long term (current) use of aspirin: Secondary | ICD-10-CM | POA: Diagnosis not present

## 2018-05-20 DIAGNOSIS — Z96652 Presence of left artificial knee joint: Secondary | ICD-10-CM | POA: Diagnosis not present

## 2018-05-20 DIAGNOSIS — G4733 Obstructive sleep apnea (adult) (pediatric): Secondary | ICD-10-CM | POA: Diagnosis not present

## 2018-05-20 DIAGNOSIS — M48061 Spinal stenosis, lumbar region without neurogenic claudication: Secondary | ICD-10-CM | POA: Diagnosis not present

## 2018-05-20 DIAGNOSIS — Z7984 Long term (current) use of oral hypoglycemic drugs: Secondary | ICD-10-CM | POA: Diagnosis not present

## 2018-05-22 DIAGNOSIS — E785 Hyperlipidemia, unspecified: Secondary | ICD-10-CM | POA: Diagnosis not present

## 2018-05-22 DIAGNOSIS — E119 Type 2 diabetes mellitus without complications: Secondary | ICD-10-CM | POA: Diagnosis not present

## 2018-05-22 DIAGNOSIS — M1990 Unspecified osteoarthritis, unspecified site: Secondary | ICD-10-CM | POA: Diagnosis not present

## 2018-05-22 DIAGNOSIS — M810 Age-related osteoporosis without current pathological fracture: Secondary | ICD-10-CM | POA: Diagnosis not present

## 2018-05-22 DIAGNOSIS — Z7984 Long term (current) use of oral hypoglycemic drugs: Secondary | ICD-10-CM | POA: Diagnosis not present

## 2018-05-22 DIAGNOSIS — S22080D Wedge compression fracture of T11-T12 vertebra, subsequent encounter for fracture with routine healing: Secondary | ICD-10-CM | POA: Diagnosis not present

## 2018-05-22 DIAGNOSIS — Z9181 History of falling: Secondary | ICD-10-CM | POA: Diagnosis not present

## 2018-05-22 DIAGNOSIS — Z96652 Presence of left artificial knee joint: Secondary | ICD-10-CM | POA: Diagnosis not present

## 2018-05-22 DIAGNOSIS — Z7982 Long term (current) use of aspirin: Secondary | ICD-10-CM | POA: Diagnosis not present

## 2018-05-22 DIAGNOSIS — G309 Alzheimer's disease, unspecified: Secondary | ICD-10-CM | POA: Diagnosis not present

## 2018-05-22 DIAGNOSIS — M48061 Spinal stenosis, lumbar region without neurogenic claudication: Secondary | ICD-10-CM | POA: Diagnosis not present

## 2018-05-22 DIAGNOSIS — E559 Vitamin D deficiency, unspecified: Secondary | ICD-10-CM | POA: Diagnosis not present

## 2018-05-22 DIAGNOSIS — G4733 Obstructive sleep apnea (adult) (pediatric): Secondary | ICD-10-CM | POA: Diagnosis not present

## 2018-08-20 ENCOUNTER — Other Ambulatory Visit: Payer: Self-pay | Admitting: Family Medicine

## 2018-09-09 ENCOUNTER — Other Ambulatory Visit: Payer: Self-pay

## 2018-09-09 DIAGNOSIS — I714 Abdominal aortic aneurysm, without rupture, unspecified: Secondary | ICD-10-CM

## 2018-09-23 ENCOUNTER — Ambulatory Visit (HOSPITAL_COMMUNITY)
Admission: RE | Admit: 2018-09-23 | Discharge: 2018-09-23 | Disposition: A | Discharge status: Home / Self Care | Payer: Medicare Other | Source: Ambulatory Visit | Attending: Surgery | Admitting: Surgery

## 2018-09-23 ENCOUNTER — Other Ambulatory Visit: Payer: Self-pay

## 2018-09-23 DIAGNOSIS — I714 Abdominal aortic aneurysm, without rupture, unspecified: Secondary | ICD-10-CM

## 2019-02-17 ENCOUNTER — Encounter
Payer: No Typology Code available for payment source | Attending: Physical Medicine and Rehabilitation | Admitting: Physical Medicine and Rehabilitation

## 2019-02-17 ENCOUNTER — Emergency Department (HOSPITAL_COMMUNITY)
Admission: EM | Admit: 2019-02-17 | Discharge: 2019-02-17 | Disposition: A | Discharge status: Home / Self Care | Payer: No Typology Code available for payment source | Attending: Emergency Medicine | Admitting: Emergency Medicine

## 2019-02-17 ENCOUNTER — Encounter: Payer: Self-pay | Admitting: Physical Medicine and Rehabilitation

## 2019-02-17 ENCOUNTER — Emergency Department (HOSPITAL_COMMUNITY): Payer: No Typology Code available for payment source

## 2019-02-17 ENCOUNTER — Telehealth: Payer: Self-pay

## 2019-02-17 ENCOUNTER — Other Ambulatory Visit: Payer: Self-pay

## 2019-02-17 VITALS — BP 110/75 | HR 91 | Temp 97.7°F | Ht 64.0 in | Wt 233.0 lb

## 2019-02-17 DIAGNOSIS — Z85118 Personal history of other malignant neoplasm of bronchus and lung: Secondary | ICD-10-CM | POA: Diagnosis not present

## 2019-02-17 DIAGNOSIS — Z79899 Other long term (current) drug therapy: Secondary | ICD-10-CM | POA: Insufficient documentation

## 2019-02-17 DIAGNOSIS — I714 Abdominal aortic aneurysm, without rupture: Secondary | ICD-10-CM | POA: Diagnosis not present

## 2019-02-17 DIAGNOSIS — M546 Pain in thoracic spine: Secondary | ICD-10-CM | POA: Diagnosis not present

## 2019-02-17 DIAGNOSIS — R197 Diarrhea, unspecified: Secondary | ICD-10-CM | POA: Diagnosis not present

## 2019-02-17 DIAGNOSIS — M545 Low back pain: Secondary | ICD-10-CM | POA: Insufficient documentation

## 2019-02-17 DIAGNOSIS — Z87891 Personal history of nicotine dependence: Secondary | ICD-10-CM | POA: Diagnosis not present

## 2019-02-17 DIAGNOSIS — E119 Type 2 diabetes mellitus without complications: Secondary | ICD-10-CM | POA: Diagnosis not present

## 2019-02-17 DIAGNOSIS — N319 Neuromuscular dysfunction of bladder, unspecified: Secondary | ICD-10-CM

## 2019-02-17 DIAGNOSIS — K592 Neurogenic bowel, not elsewhere classified: Secondary | ICD-10-CM

## 2019-02-17 DIAGNOSIS — G834 Cauda equina syndrome: Secondary | ICD-10-CM

## 2019-02-17 DIAGNOSIS — R1010 Upper abdominal pain, unspecified: Secondary | ICD-10-CM | POA: Diagnosis not present

## 2019-02-17 DIAGNOSIS — Z96652 Presence of left artificial knee joint: Secondary | ICD-10-CM | POA: Diagnosis not present

## 2019-02-17 LAB — CBC WITH DIFFERENTIAL/PLATELET
Abs Immature Granulocytes: 0.03 10*3/uL (ref 0.00–0.07)
Basophils Absolute: 0 10*3/uL (ref 0.0–0.1)
Basophils Relative: 0 %
Eosinophils Absolute: 0.3 10*3/uL (ref 0.0–0.5)
Eosinophils Relative: 3 %
HCT: 43.4 % (ref 39.0–52.0)
Hemoglobin: 14.3 g/dL (ref 13.0–17.0)
Immature Granulocytes: 0 %
Lymphocytes Relative: 28 %
Lymphs Abs: 2.5 10*3/uL (ref 0.7–4.0)
MCH: 29.2 pg (ref 26.0–34.0)
MCHC: 32.9 g/dL (ref 30.0–36.0)
MCV: 88.8 fL (ref 80.0–100.0)
Monocytes Absolute: 1.1 10*3/uL — ABNORMAL HIGH (ref 0.1–1.0)
Monocytes Relative: 12 %
Neutro Abs: 5.1 10*3/uL (ref 1.7–7.7)
Neutrophils Relative %: 57 %
Platelets: 192 10*3/uL (ref 150–400)
RBC: 4.89 MIL/uL (ref 4.22–5.81)
RDW: 15.3 % (ref 11.5–15.5)
WBC: 9 10*3/uL (ref 4.0–10.5)
nRBC: 0 % (ref 0.0–0.2)

## 2019-02-17 LAB — COMPREHENSIVE METABOLIC PANEL
ALT: 17 U/L (ref 0–44)
AST: 19 U/L (ref 15–41)
Albumin: 3.9 g/dL (ref 3.5–5.0)
Alkaline Phosphatase: 136 U/L — ABNORMAL HIGH (ref 38–126)
Anion gap: 10 (ref 5–15)
BUN: 12 mg/dL (ref 8–23)
CO2: 25 mmol/L (ref 22–32)
Calcium: 9.3 mg/dL (ref 8.9–10.3)
Chloride: 103 mmol/L (ref 98–111)
Creatinine, Ser: 1.18 mg/dL (ref 0.61–1.24)
GFR calc Af Amer: >60 mL/min (ref >60)
GFR calc non Af Amer: >60 mL/min (ref >60)
Glucose, Bld: 108 mg/dL — ABNORMAL HIGH (ref 70–99)
Potassium: 3.9 mmol/L (ref 3.5–5.1)
Sodium: 138 mmol/L (ref 135–145)
Total Bilirubin: 0.6 mg/dL (ref 0.3–1.2)
Total Protein: 7.3 g/dL (ref 6.5–8.1)

## 2019-02-17 LAB — LIPASE, BLOOD: Lipase: 27 U/L (ref 11–51)

## 2019-02-17 MED ORDER — IOHEXOL 300 MG/ML  SOLN
100.0000 mL | Freq: Once | INTRAMUSCULAR | Status: AC | PRN
  Administered 2019-02-17: 22:00:00 100 mL via INTRAVENOUS

## 2019-02-17 MED ORDER — SODIUM CHLORIDE 0.9 % IV BOLUS
1000.0000 mL | Freq: Once | INTRAVENOUS | Status: AC
Start: 2019-02-17 — End: 2019-02-17
  Administered 2019-02-17: 1000 mL via INTRAVENOUS
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
## 2019-02-17 NOTE — ED Notes (Signed)
Pt verbalized understanding of discharge instructions and denies any further questions at this time.   
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
## 2019-02-17 NOTE — Discharge Instructions (Addendum)
Follow up with your primary doctor or gastroenterologist in the next 1-2 weeks.
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
## 2019-02-17 NOTE — Telephone Encounter (Signed)
error 

## 2019-02-18 ENCOUNTER — Telehealth: Payer: Self-pay | Admitting: Physical Medicine and Rehabilitation

## 2019-02-18 DIAGNOSIS — R159 Full incontinence of feces: Secondary | ICD-10-CM

## 2019-02-18 DIAGNOSIS — R32 Unspecified urinary incontinence: Secondary | ICD-10-CM

## 2019-02-18 NOTE — Telephone Encounter (Signed)
Talked to Harold Morris on the phone and he was telling me that they did not do what Dr. Dagoberto Ligas told them to do and he was upset about it. He wants Dr. Dagoberto Ligas to call him back as soon as possible.

## 2019-02-18 NOTE — Telephone Encounter (Signed)
[facility] called stating patient went to ER on MRI of back done, ER said they don't take walk ins for MRI. Need referral put in for MRI.

## 2019-02-19 ENCOUNTER — Ambulatory Visit (HOSPITAL_COMMUNITY)
Admission: RE | Admit: 2019-02-19 | Discharge: 2019-02-19 | Disposition: A | Discharge status: Home / Self Care | Payer: No Typology Code available for payment source | Source: Ambulatory Visit | Attending: Physical Medicine and Rehabilitation | Admitting: Physical Medicine and Rehabilitation

## 2019-02-19 ENCOUNTER — Other Ambulatory Visit: Payer: Self-pay

## 2019-02-19 DIAGNOSIS — R159 Full incontinence of feces: Secondary | ICD-10-CM | POA: Insufficient documentation

## 2019-02-19 DIAGNOSIS — R32 Unspecified urinary incontinence: Secondary | ICD-10-CM | POA: Diagnosis present

## 2019-02-19 MED ORDER — GADOBUTROL 1 MMOL/ML IV SOLN
10.0000 mL | Freq: Once | INTRAVENOUS | Status: AC | PRN
  Administered 2019-02-19: 18:00:00 10 mL via INTRAVENOUS

## 2019-02-19 NOTE — Telephone Encounter (Signed)
If you will go ahead and put the referral in for the MRI, I will take care of it and get this gentleman scheduled.  Can you put it in for [facility]?  I can call them and get it scheduled sooner hopefully.  Thanks.

## 2019-02-19 NOTE — Telephone Encounter (Signed)
Called pt and discussed the ER visit.  He specifically said he told ER doctor that "a male doctor" asked for MRI to be done for back- due to his cognitive impairment, wasn't able to explain about bowel and bladder, but was clear on phone today that whenever he walks, he's having bowel accidents and getting "much" worse. Wife agreed.   Will try to get MRIs at [facility] via outpatient MRI, not ER and if positive, will call [facility].  Explained this to pt- he agreed and asked if they can be done tomorrow.

## 2019-02-23 ENCOUNTER — Telehealth: Payer: Self-pay

## 2019-02-23 ENCOUNTER — Telehealth: Payer: Self-pay | Admitting: *Deleted

## 2019-02-23 NOTE — Telephone Encounter (Signed)
Patient left voicemail at 3:09 pm Friday 9/2 wanting results and also states he needs to contact Attorney Sammy? - wasn't clear on what that meant.  FYI - [facility] - even though it's already performed - but I'll deal with that - MRI is on patient chart

## 2019-02-23 NOTE — Telephone Encounter (Signed)
Patient wants to know if Dr. Dagoberto Ligas has found him a surgeon yet?Marland Kitchen  He is asking for a call back

## 2019-02-23 NOTE — Telephone Encounter (Signed)
Footnote - recd call from [facility] stating [facility] is denying - I faxed copy of medical record and copy of referral showing MRI approved for episode of care to [facility] 10.28.2010:25am lk

## 2019-02-24 NOTE — Telephone Encounter (Signed)
I have patient scheduled next Tuesday with Dr. Letta Pate, but he was asking about some other injections that you were going to do for him.  Are we supposed to be scheduling him something else?  He would like a call back.

## 2019-02-24 NOTE — Telephone Encounter (Signed)
Called home number- got pt's wife.  Have spoken to 2 Neurosurgeons- they both said it's too far out for pt to have success with a kyphoplasty/surgery for his compression fractures.  However, they did give permission for pt to get epidural steroid injections, esp for most painful spot at T12/compression fracture.  He had allergic reaction to Norco- itching/rash all over and didn't have any success with tramadol. Currently on Zanaflex and Robaxin and lidocaine cream- patches don't stick.  Will see if Dr Letta Pate can see patient for epidural steroid injections, esp T12 pain- was OK'd by [facility] to get those injections again- and has been approved by [facility] for epidural injections.  Spoke to wife about ALL of this- she said Harold Morris would likely call back, but just need to reiterate as above what we are going to do.

## 2019-02-25 NOTE — Telephone Encounter (Signed)
Has epidural steroid injections scheduled next week.  Needs Lawyer to get ahold of records- explained to him to have medical records get those records for him. At [facility].   Pt also wanted to make sure it was documented that needed to go to ER at last visit, so [facility] would [facility] that [facility]- explained since I sent him from the appointment that [facility] sent him here for, [facility] SHOULD cover it, even though they didn't listen to him at the [facility] ER and did an Abd CT scan, not the back MRI I asked for.    Also noted has been getting injections in upper back/neck and hands- wants to make sure doesn't interfere with epidural injections. Hasn't been done lately per wife esp due to COVID. Said it shouldn't interfere, since it's been so long since they were done.  Will have pt see Dr Letta Pate next week for T12 epidural injection and maybe T6 as well.

## 2019-03-03 ENCOUNTER — Other Ambulatory Visit: Payer: Self-pay

## 2019-03-03 ENCOUNTER — Encounter
Payer: No Typology Code available for payment source | Attending: Physical Medicine and Rehabilitation | Admitting: Physical Medicine & Rehabilitation

## 2019-03-03 ENCOUNTER — Encounter: Payer: Self-pay | Admitting: Physical Medicine & Rehabilitation

## 2019-03-03 VITALS — BP 121/81 | HR 79 | Temp 97.3°F | Resp 14 | Ht 69.0 in | Wt 235.0 lb

## 2019-03-03 DIAGNOSIS — M549 Dorsalgia, unspecified: Secondary | ICD-10-CM | POA: Diagnosis not present

## 2019-03-03 DIAGNOSIS — M533 Sacrococcygeal disorders, not elsewhere classified: Secondary | ICD-10-CM

## 2019-03-03 NOTE — Patient Instructions (Addendum)
Sacroiliac injection was performed today.  The injection was done under x-ray guidance. This procedure has been performed to help reduce low back and buttocks pain as well as potentially hip pain. The duration of this injection is variable .  It may repeated if needed. If only short-term relief is obtained, a radiofrequency procedure may be helpful in obtaining longer-term relief. You will need to follow-up with your main physical medicine and rehab doctor to assess the effect of your injections

## 2019-03-03 NOTE — Progress Notes (Signed)
  [facility]   Name: Harold Morris DOB:06/16/1950 MRN: [id number]  Date:03/03/2019  Physician: Alysia Penna, MD    Nurse/CMA: Wessling, CMA  Allergies:  Allergies  Allergen Reactions  . Epinephrine Anaphylaxis  . Sulfa Antibiotics Other (See Comments)    unknown  . Oxycontin [Oxycodone Hcl]   . Hydrocodone Itching and Rash    High strength dosing causes reaction  . Morphine Other (See Comments)    hallucinations    Consent Signed: Yes.    Is patient diabetic? Yes.    CBG today? 53  Pregnant: No. LMP: No LMP for male patient. (age 56-55)  Anticoagulants: no Anti-inflammatory: no Antibiotics: no  Procedure: sacroiliac steroid injection  Position: Prone Start Time: 2:15pm End Time:  2:19pm        Fluoro Time: 25s  RN/CMA Wessling, CMA Wessling, CMA    Time 145pm 2:20pm    BP 121/81 142/86    Pulse 79 75    Respirations 14 14    O2 Sat 96 96    S/S 76 6    Pain Level 6/10 3/10     D/C home with wife, patient A & O X 3, D/C instructions reviewed, and sits independently.      Left sacroiliac injection under fluoroscopic guidance  Indication: Left Low back and buttocks pain not relieved by medication management and other conservative care.  Informed consent was obtained after describing risks and benefits of the procedure with the patient, this includes bleeding, bruising, infection, paralysis and medication side effects. The patient wishes to proceed and has given written consent. The patient was placed in a prone position. The lumbar and sacral area was marked and prepped with Betadine. A 25-gauge 1-1/2 inch needle was inserted into the skin and subcutaneous tissue and 1 mL of 1% lidocaine was injected. Then a 25-gauge 3 inch spinal needle was inserted under fluoroscopic guidance into the left sacroiliac joint. AP and lateral images were utilized. Isovue 200x0.5 mL under live fluoroscopy demonstrated no  intravascular uptake. Then a solution containing 2% lidocaine MPF was injected x1.5 mL. Patient tolerated the procedure well. Post procedure instructions were given. Please see post procedure form.  If this injection provides a greater than 50% short-term relief next step may be L5 dorsal ramus S1-S2-S3 lateral branch blocks

## 2019-03-17 ENCOUNTER — Encounter: Payer: No Typology Code available for payment source | Admitting: Physical Medicine and Rehabilitation

## 2019-04-01 ENCOUNTER — Encounter

## 2019-04-01 ENCOUNTER — Encounter
Payer: No Typology Code available for payment source | Attending: Physical Medicine and Rehabilitation | Admitting: Physical Medicine and Rehabilitation

## 2019-04-01 ENCOUNTER — Encounter: Payer: Self-pay | Admitting: Physical Medicine and Rehabilitation

## 2019-04-01 ENCOUNTER — Other Ambulatory Visit: Payer: Self-pay

## 2019-04-01 VITALS — BP 126/80 | HR 79 | Temp 97.2°F | Ht 69.0 in | Wt 234.0 lb

## 2019-04-01 DIAGNOSIS — M546 Pain in thoracic spine: Secondary | ICD-10-CM

## 2019-04-01 DIAGNOSIS — M549 Dorsalgia, unspecified: Secondary | ICD-10-CM | POA: Diagnosis not present

## 2019-04-01 DIAGNOSIS — S22000A Wedge compression fracture of unspecified thoracic vertebra, initial encounter for closed fracture: Secondary | ICD-10-CM | POA: Diagnosis not present

## 2019-04-01 DIAGNOSIS — M7918 Myalgia, other site: Secondary | ICD-10-CM | POA: Insufficient documentation

## 2019-04-01 MED ORDER — LIDOCAINE 5 % EX PTCH: 3.0000 | MEDICATED_PATCH | Freq: Two times a day (BID) | CUTANEOUS | 5 refills | Status: DC

## 2019-04-01 NOTE — Patient Instructions (Signed)
Pt is a 68 yr old male with T6 and T12 subacute wedge compression fractures with severe back pain and chronic bowel and bladder incontinence. Additional myofascial pain.   1. Will have Dr Letta Pate- will have it scheduled- L5 dorsal ramus S1-S2-S3 lateral branch blocks.  2. Lidocaine patches- 2-3 patches 12 hrs on;12 hrs off- will order from [place]- has gotten for patients there in past.   3. Would benefit from trigger point injection- Can also use lidocaine patches on neck. Will get him back in the next 1-2 weeks.   4. Theracane- [facility] and [facility]- - per pt has it, and doesn't use it.  5. F/U with Dr Margretta Sidle for trigger point injections.
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
## 2019-04-02 ENCOUNTER — Encounter (HOSPITAL_COMMUNITY): Payer: Self-pay | Admitting: Emergency Medicine

## 2019-04-02 ENCOUNTER — Emergency Department (HOSPITAL_COMMUNITY)
Admission: EM | Admit: 2019-04-02 | Discharge: 2019-04-03 | Disposition: A | Discharge status: Home / Self Care | Payer: No Typology Code available for payment source | Attending: Emergency Medicine | Admitting: Emergency Medicine

## 2019-04-02 DIAGNOSIS — M25512 Pain in left shoulder: Secondary | ICD-10-CM | POA: Insufficient documentation

## 2019-04-02 DIAGNOSIS — Y939 Activity, unspecified: Secondary | ICD-10-CM | POA: Diagnosis not present

## 2019-04-02 DIAGNOSIS — S0990XA Unspecified injury of head, initial encounter: Secondary | ICD-10-CM

## 2019-04-02 DIAGNOSIS — R0789 Other chest pain: Secondary | ICD-10-CM | POA: Diagnosis not present

## 2019-04-02 DIAGNOSIS — S60221A Contusion of right hand, initial encounter: Secondary | ICD-10-CM | POA: Diagnosis not present

## 2019-04-02 DIAGNOSIS — N210 Calculus in bladder: Secondary | ICD-10-CM

## 2019-04-02 DIAGNOSIS — N323 Diverticulum of bladder: Secondary | ICD-10-CM

## 2019-04-02 DIAGNOSIS — R079 Chest pain, unspecified: Secondary | ICD-10-CM | POA: Diagnosis not present

## 2019-04-02 DIAGNOSIS — R109 Unspecified abdominal pain: Secondary | ICD-10-CM | POA: Diagnosis not present

## 2019-04-02 DIAGNOSIS — M542 Cervicalgia: Secondary | ICD-10-CM | POA: Insufficient documentation

## 2019-04-02 DIAGNOSIS — Y999 Unspecified external cause status: Secondary | ICD-10-CM | POA: Diagnosis not present

## 2019-04-02 DIAGNOSIS — Y92524 Gas station as the place of occurrence of the external cause: Secondary | ICD-10-CM | POA: Insufficient documentation

## 2019-04-02 DIAGNOSIS — R519 Headache, unspecified: Secondary | ICD-10-CM | POA: Diagnosis not present

## 2019-04-02 DIAGNOSIS — T07XXXA Unspecified multiple injuries, initial encounter: Secondary | ICD-10-CM | POA: Diagnosis present
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
## 2019-04-03 ENCOUNTER — Encounter: Payer: No Typology Code available for payment source | Admitting: Physical Medicine and Rehabilitation

## 2019-04-03 ENCOUNTER — Emergency Department (HOSPITAL_COMMUNITY): Payer: No Typology Code available for payment source

## 2019-04-03 ENCOUNTER — Encounter (HOSPITAL_COMMUNITY): Payer: Self-pay | Admitting: Radiology

## 2019-04-03 LAB — COMPREHENSIVE METABOLIC PANEL
ALT: 19 U/L (ref 0–44)
AST: 23 U/L (ref 15–41)
Albumin: 4.3 g/dL (ref 3.5–5.0)
Alkaline Phosphatase: 139 U/L — ABNORMAL HIGH (ref 38–126)
Anion gap: 13 (ref 5–15)
BUN: 15 mg/dL (ref 8–23)
CO2: 24 mmol/L (ref 22–32)
Calcium: 9.7 mg/dL (ref 8.9–10.3)
Chloride: 100 mmol/L (ref 98–111)
Creatinine, Ser: 1.07 mg/dL (ref 0.61–1.24)
GFR calc Af Amer: >60 mL/min (ref >60)
GFR calc non Af Amer: >60 mL/min (ref >60)
Glucose, Bld: 106 mg/dL — ABNORMAL HIGH (ref 70–99)
Potassium: 3.9 mmol/L (ref 3.5–5.1)
Sodium: 137 mmol/L (ref 135–145)
Total Bilirubin: 1.1 mg/dL (ref 0.3–1.2)
Total Protein: 7.9 g/dL (ref 6.5–8.1)

## 2019-04-03 LAB — URINALYSIS, ROUTINE W REFLEX MICROSCOPIC
Bacteria, UA: NONE SEEN
Bilirubin Urine: NEGATIVE
Glucose, UA: NEGATIVE mg/dL
Ketones, ur: NEGATIVE mg/dL
Leukocytes,Ua: NEGATIVE
Nitrite: NEGATIVE
Protein, ur: NEGATIVE mg/dL
Specific Gravity, Urine: 1.029 (ref 1.005–1.030)
pH: 5 (ref 5.0–8.0)

## 2019-04-03 LAB — I-STAT CHEM 8, ED
BUN: 17 mg/dL (ref 8–23)
Calcium, Ion: 1.22 mmol/L (ref 1.15–1.40)
Chloride: 102 mmol/L (ref 98–111)
Creatinine, Ser: 0.9 mg/dL (ref 0.61–1.24)
Glucose, Bld: 105 mg/dL — ABNORMAL HIGH (ref 70–99)
HCT: 49 % (ref 39.0–52.0)
Hemoglobin: 16.7 g/dL (ref 13.0–17.0)
Potassium: 4.1 mmol/L (ref 3.5–5.1)
Sodium: 140 mmol/L (ref 135–145)
TCO2: 26 mmol/L (ref 22–32)

## 2019-04-03 LAB — CBC WITH DIFFERENTIAL/PLATELET
Abs Immature Granulocytes: 0.04 10*3/uL (ref 0.00–0.07)
Basophils Absolute: 0.1 10*3/uL (ref 0.0–0.1)
Basophils Relative: 1 %
Eosinophils Absolute: 0.3 10*3/uL (ref 0.0–0.5)
Eosinophils Relative: 3 %
HCT: 47.8 % (ref 39.0–52.0)
Hemoglobin: 15.4 g/dL (ref 13.0–17.0)
Immature Granulocytes: 0 %
Lymphocytes Relative: 28 %
Lymphs Abs: 3.1 10*3/uL (ref 0.7–4.0)
MCH: 28.6 pg (ref 26.0–34.0)
MCHC: 32.2 g/dL (ref 30.0–36.0)
MCV: 88.7 fL (ref 80.0–100.0)
Monocytes Absolute: 1.1 10*3/uL — ABNORMAL HIGH (ref 0.1–1.0)
Monocytes Relative: 10 %
Neutro Abs: 6.6 10*3/uL (ref 1.7–7.7)
Neutrophils Relative %: 58 %
Platelets: 200 10*3/uL (ref 150–400)
RBC: 5.39 MIL/uL (ref 4.22–5.81)
RDW: 14.9 % (ref 11.5–15.5)
WBC: 11.1 10*3/uL — ABNORMAL HIGH (ref 4.0–10.5)
nRBC: 0 % (ref 0.0–0.2)

## 2019-04-03 MED ORDER — ONDANSETRON HCL 4 MG/2ML IJ SOLN
4.0000 mg | Freq: Once | INTRAMUSCULAR | Status: AC
  Administered 2019-04-03: 06:00:00 4 mg via INTRAVENOUS
  Filled 2019-04-03: qty 2

## 2019-04-03 MED ORDER — TAMSULOSIN HCL 0.4 MG PO CAPS: 0.4000 mg | ORAL_CAPSULE | Freq: Every day | ORAL | 0 refills | Status: AC

## 2019-04-03 MED ORDER — FENTANYL CITRATE (PF) 100 MCG/2ML IJ SOLN
50.0000 ug | Freq: Once | INTRAMUSCULAR | Status: AC
  Administered 2019-04-03: 50 ug via INTRAVENOUS
  Filled 2019-04-03: qty 2

## 2019-04-03 MED ORDER — IOHEXOL 300 MG/ML  SOLN
100.0000 mL | Freq: Once | INTRAMUSCULAR | Status: AC | PRN
  Administered 2019-04-03: 100 mL via INTRAVENOUS

## 2019-04-03 MED ORDER — SODIUM CHLORIDE 0.9 % IV BOLUS (SEPSIS)
1000.0000 mL | Freq: Once | INTRAVENOUS | Status: AC
  Administered 2019-04-03: 1000 mL via INTRAVENOUS

## 2019-04-03 NOTE — ED Notes (Signed)
Patient transported to CT scan . 
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
## 2019-04-03 NOTE — ED Notes (Signed)
Pt ambulated a somewhat steady gait. Pt stated that he felt a little off balance due to laying down for so long. Pt also stated that he was sore. After pt was up and walking for a few minutes his gait was steady but still needing some standby assistance.
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
## 2019-04-03 NOTE — ED Notes (Signed)
Patient verbalizes understanding of discharge instructions. Opportunity for questioning and answers were provided. Armband removed by staff, pt discharged from ED.  

## 2019-04-03 NOTE — Discharge Instructions (Addendum)
You may take your chronic pain medication as needed for pain.  Your labs, x-rays, CT imaging today showed no fractures, bleeding or other acute abnormality.  Your CT did incidentally show that you have a infrarenal abdominal aortic aneurysm measuring 4.3 cm without significant change. Recommend followup by ultrasound in 1 year.  This is something that can be followed by her primary care physician.  This is not due to your salt.  We will refer you to Urology for your urinary symptoms, bladder diverticula and bladder stone.

## 2019-04-04 LAB — URINE CULTURE: Culture: NO GROWTH

## 2019-04-07 DIAGNOSIS — R350 Frequency of micturition: Secondary | ICD-10-CM | POA: Diagnosis not present

## 2019-04-07 DIAGNOSIS — R3912 Poor urinary stream: Secondary | ICD-10-CM | POA: Diagnosis not present

## 2019-04-10 ENCOUNTER — Other Ambulatory Visit: Payer: Self-pay

## 2019-04-10 ENCOUNTER — Encounter: Payer: Self-pay | Admitting: Physical Medicine and Rehabilitation

## 2019-04-10 ENCOUNTER — Encounter (HOSPITAL_BASED_OUTPATIENT_CLINIC_OR_DEPARTMENT_OTHER): Payer: No Typology Code available for payment source | Admitting: Physical Medicine and Rehabilitation

## 2019-04-10 VITALS — BP 121/80 | HR 72 | Temp 96.6°F | Ht 69.0 in | Wt 225.0 lb

## 2019-04-10 DIAGNOSIS — S22000A Wedge compression fracture of unspecified thoracic vertebra, initial encounter for closed fracture: Secondary | ICD-10-CM

## 2019-04-10 DIAGNOSIS — M546 Pain in thoracic spine: Secondary | ICD-10-CM

## 2019-04-10 DIAGNOSIS — M549 Dorsalgia, unspecified: Secondary | ICD-10-CM | POA: Diagnosis not present

## 2019-04-10 DIAGNOSIS — M7918 Myalgia, other site: Secondary | ICD-10-CM
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
## 2019-04-30 ENCOUNTER — Encounter
Payer: No Typology Code available for payment source | Attending: Physical Medicine and Rehabilitation | Admitting: Physical Medicine & Rehabilitation

## 2019-04-30 ENCOUNTER — Encounter: Payer: Self-pay | Admitting: Physical Medicine & Rehabilitation

## 2019-04-30 ENCOUNTER — Other Ambulatory Visit: Payer: Self-pay

## 2019-04-30 VITALS — BP 120/82 | HR 75 | Temp 98.0°F | Ht 69.0 in | Wt 227.0 lb

## 2019-04-30 DIAGNOSIS — M549 Dorsalgia, unspecified: Secondary | ICD-10-CM | POA: Diagnosis not present

## 2019-04-30 DIAGNOSIS — M533 Sacrococcygeal disorders, not elsewhere classified: Secondary | ICD-10-CM

## 2019-04-30 NOTE — Progress Notes (Signed)
L5 dorsal ramus S1-S2-S3 lateral branch blocks under fluoroscopic guidance Left side  Informed consent was obtained after describing risks and benefits of the procedure with patient these include bleeding bruising and infection.  He elects to proceed and has given written consent.  Patient placed prone on fluoroscopy table Betadine prep sterile drape a 25-gauge 1.5 inch needle was used to anesthetize skin and subcu tissue with 1% lidocaine 1 cc into each of 4 sites.  Then a 22-gauge 3.5" needle was inserted under fluoroscopic guidance for starting the S1 SAP sacral ala junction.  Bone contact made.  Isovue 200 x 0.5 mL demonstrated no intravascular uptake then 0.5 mL of 2% lidocaine was injected.  Then the lateral aspect of the S1, S2, S3 foramen was targeted.  Bone contact made out, Isovue-200 times 0.5 mL demonstrated no nerve root or intravascular uptake within 0.5 mL of 2% lidocaine solution was injected after negative drawback for blood.  Patient tolerated procedure well.  Postinjection instructions given.

## 2019-04-30 NOTE — Progress Notes (Signed)
  PROCEDURE RECORD [facility]   Name: Harold Morris DOB:10/05/1950 MRN: [id number]  Date:04/30/2019  Physician: Alysia Penna, MD    Nurse/CMA: Betti Cruz  Allergies:  Allergies  Allergen Reactions  . Epinephrine Anaphylaxis  . Sulfa Antibiotics Other (See Comments)    unknown  . Oxycontin [Oxycodone Hcl]   . Hydrocodone Itching and Rash    High strength dosing causes reaction  . Morphine Other (See Comments)    hallucinations    Consent Signed: Yes.    Is patient diabetic? Yes.    CBG today? 145  Pregnant: No. LMP: No LMP for male patient. (age 3-55)  Anticoagulants: no Anti-inflammatory: no Antibiotics: no  Procedure: Bilateral Lateral Branch Block Position: Prone Start Time: 1:10pm End Time: 1:24pm Fluoro Time: 5  RN/CMA Truman Hayward, CMA Dena Esperanza, CMA    Time 1:10pm 1:56pm    BP 120/82 127/84    Pulse 75 77    Respirations 16 16    O2 Sat 97 97    S/S 6 6    Pain Level 5/10 1/10     D/C home with wife, patient A & O X 3, D/C instructions reviewed, and sits independently.

## 2019-04-30 NOTE — Patient Instructions (Signed)
Sacroiliac nerve blocks were performed today.  If he had a good response to this block and it wears off after days or weeks, you would be a good candidate for radiofrequency neurotomy of the same nerves.

## 2019-05-05 ENCOUNTER — Other Ambulatory Visit: Payer: Self-pay

## 2019-05-05 MED ORDER — LIDOCAINE 5 % EX PTCH: 3.0000 | MEDICATED_PATCH | Freq: Two times a day (BID) | CUTANEOUS | 5 refills | Status: DC

## 2019-05-05 NOTE — Telephone Encounter (Signed)
Original prescription sent to wrong [place] pharmacy, reordering and printing to send to correct [facility] pharmacy

## 2019-05-08 ENCOUNTER — Encounter: Payer: Self-pay | Admitting: Physical Medicine and Rehabilitation

## 2019-05-08 ENCOUNTER — Encounter (HOSPITAL_BASED_OUTPATIENT_CLINIC_OR_DEPARTMENT_OTHER): Payer: No Typology Code available for payment source | Admitting: Physical Medicine and Rehabilitation

## 2019-05-08 ENCOUNTER — Other Ambulatory Visit: Payer: Self-pay

## 2019-05-08 VITALS — BP 124/82 | HR 73 | Temp 97.2°F | Ht 69.0 in | Wt 227.0 lb

## 2019-05-08 DIAGNOSIS — M7918 Myalgia, other site: Secondary | ICD-10-CM

## 2019-05-08 DIAGNOSIS — K592 Neurogenic bowel, not elsewhere classified: Secondary | ICD-10-CM

## 2019-05-08 DIAGNOSIS — K529 Noninfective gastroenteritis and colitis, unspecified: Secondary | ICD-10-CM

## 2019-05-08 DIAGNOSIS — M549 Dorsalgia, unspecified: Secondary | ICD-10-CM | POA: Diagnosis not present

## 2019-05-08 DIAGNOSIS — R200 Anesthesia of skin: Secondary | ICD-10-CM | POA: Insufficient documentation

## 2019-05-08 DIAGNOSIS — S22000A Wedge compression fracture of unspecified thoracic vertebra, initial encounter for closed fracture: Secondary | ICD-10-CM
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
## 2019-05-13 DIAGNOSIS — R350 Frequency of micturition: Secondary | ICD-10-CM | POA: Diagnosis not present

## 2019-05-13 DIAGNOSIS — R3912 Poor urinary stream: Secondary | ICD-10-CM | POA: Diagnosis not present

## 2019-05-26 DIAGNOSIS — R350 Frequency of micturition: Secondary | ICD-10-CM | POA: Diagnosis not present

## 2019-06-04 ENCOUNTER — Encounter
Payer: No Typology Code available for payment source | Attending: Physical Medicine and Rehabilitation | Admitting: Physical Medicine & Rehabilitation

## 2019-06-04 ENCOUNTER — Encounter: Payer: Self-pay | Admitting: Physical Medicine & Rehabilitation

## 2019-06-04 ENCOUNTER — Other Ambulatory Visit: Payer: Self-pay

## 2019-06-04 VITALS — BP 111/75 | HR 76 | Temp 97.8°F | Ht 69.0 in | Wt 234.0 lb

## 2019-06-04 DIAGNOSIS — G8929 Other chronic pain: Secondary | ICD-10-CM | POA: Diagnosis not present

## 2019-06-04 DIAGNOSIS — M549 Dorsalgia, unspecified: Secondary | ICD-10-CM | POA: Diagnosis present

## 2019-06-04 DIAGNOSIS — M533 Sacrococcygeal disorders, not elsewhere classified: Secondary | ICD-10-CM

## 2019-06-04 NOTE — Progress Notes (Signed)
S1-S2-S3 lateral branch blocks under fluoroscopic guidance Bilateral  Informed consent was obtained after describing risks and benefits of the procedure with patient these include bleeding bruising and infection.  He elects to proceed and has given written consent.  Patient placed prone on fluoroscopy table Betadine prep sterile drape a 25-gauge 1.5 inch needle was used to anesthetize skin and subcu tissue with 1% lidocaine 1 cc into each of 4 sites.  Then a 22-gauge 3.5" needle was inserted under fluoroscopic guidance for starting the  Left S1 SAP sacral ala junction.  Bone contact made.  Isovue 200 x 0.5 mL demonstrated no intravascular uptake then 0.5 mL of 2% lidocaine was injected.  Then the lateral aspect of the S1, S2, S3 foramen was targeted.  Bone contact made out, Isovue-200 times 0.5 mL demonstrated no nerve root or intravascular uptake within 0.5 mL of 2% lidocaine solution was injected after negative drawback for blood.THe same procedure was performed on the Right side except for the L5 Dorsal ramus which could not be accessed due to bone graft material.   Patient tolerated procedure well.  Postinjection instructions given.

## 2019-06-04 NOTE — Patient Instructions (Signed)
Discuss the results of the procedure with Dr. Alice Rieger at your next visit.  If you had at least 50% relief on both sides the next step would be a radiofrequency neurotomy of the same nerves.

## 2019-06-04 NOTE — Progress Notes (Signed)
  [facility]   Name: PAYSON EVRARD DOB:11-29-1950 MRN: [id number]  Date:06/04/2019  Physician: Alysia Penna, MD    Nurse/CMA: Cordarro Spinnato CMA  Allergies:  Allergies  Allergen Reactions  . Epinephrine Anaphylaxis  . Sulfa Antibiotics Other (See Comments)    unknown  . Oxycontin [Oxycodone Hcl]   . Hydrocodone Itching and Rash    High strength dosing causes reaction  . Morphine Other (See Comments)    hallucinations    Consent Signed: Yes.    Is patient diabetic? Yes.    CBG today? 139   Pregnant: No. LMP: No LMP for male patient. (age 71-55)  Anticoagulants: no Anti-inflammatory:yes, robaxin Antibiotics: no  Procedure: Branch Blocks Position: Prone   Start Time: 131pm End Time: 145pm Fluoro Time: 54s  RN/CMA Teiara Baria CMA Jamiere Gulas CMA    Time 107pm 155pm    BP 111/75 121/79    Pulse 76 75    Respirations 16 16    O2 Sat 96 97    S/S 6 6    Pain Level 6/10 0/10     D/C home with self, patient A & O X 3, D/C instructions reviewed, and sits independently.

## 2019-06-08 ENCOUNTER — Encounter (HOSPITAL_BASED_OUTPATIENT_CLINIC_OR_DEPARTMENT_OTHER): Payer: No Typology Code available for payment source | Admitting: Physical Medicine and Rehabilitation

## 2019-06-08 ENCOUNTER — Encounter: Payer: Self-pay | Admitting: Physical Medicine and Rehabilitation

## 2019-06-08 ENCOUNTER — Other Ambulatory Visit: Payer: Self-pay

## 2019-06-08 VITALS — BP 145/89 | HR 80 | Temp 97.5°F | Ht 69.0 in | Wt 235.0 lb

## 2019-06-08 DIAGNOSIS — G8929 Other chronic pain: Secondary | ICD-10-CM

## 2019-06-08 DIAGNOSIS — M7918 Myalgia, other site: Secondary | ICD-10-CM

## 2019-06-08 DIAGNOSIS — G834 Cauda equina syndrome: Secondary | ICD-10-CM | POA: Diagnosis not present

## 2019-06-08 DIAGNOSIS — M549 Dorsalgia, unspecified: Secondary | ICD-10-CM | POA: Diagnosis not present

## 2019-06-08 DIAGNOSIS — M533 Sacrococcygeal disorders, not elsewhere classified: Secondary | ICD-10-CM | POA: Diagnosis not present

## 2019-06-08 DIAGNOSIS — S22000A Wedge compression fracture of unspecified thoracic vertebra, initial encounter for closed fracture: Secondary | ICD-10-CM | POA: Diagnosis not present

## 2019-06-08 DIAGNOSIS — K592 Neurogenic bowel, not elsewhere classified: Secondary | ICD-10-CM

## 2019-06-08 DIAGNOSIS — N319 Neuromuscular dysfunction of bladder, unspecified: Secondary | ICD-10-CM

## 2019-06-08 DIAGNOSIS — R200 Anesthesia of skin: Secondary | ICD-10-CM
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
## 2019-06-10 ENCOUNTER — Telehealth: Payer: Self-pay

## 2019-06-10 NOTE — Telephone Encounter (Signed)
Patient wife called stating fax prescription for gloves, Dulcolax suppositories and surgical lube to [place] [phone]

## 2019-06-11 NOTE — Telephone Encounter (Signed)
Faxed today to the [facility]

## 2019-06-11 NOTE — Telephone Encounter (Signed)
Asked team to fax other the counter "rx" to [place] for these items- thank you

## 2019-06-22 ENCOUNTER — Ambulatory Visit: Payer: Medicare Other | Attending: Internal Medicine

## 2019-06-22 DIAGNOSIS — N210 Calculus in bladder: Secondary | ICD-10-CM | POA: Diagnosis not present

## 2019-06-22 DIAGNOSIS — Z23 Encounter for immunization: Secondary | ICD-10-CM | POA: Insufficient documentation

## 2019-06-22 DIAGNOSIS — R3912 Poor urinary stream: Secondary | ICD-10-CM | POA: Diagnosis not present

## 2019-06-22 NOTE — Progress Notes (Signed)
   Covid-19 Vaccination Clinic  Name:  Harold Morris    MRN: [id number] DOB: 1950-08-25  06/22/2019  [name] was observed post Covid-19 immunization for 15 minutes without incidence. He was provided with Vaccine Information Sheet and instruction to access the V-Safe system.   [name] was instructed to call 911 with any severe reactions post vaccine: Marland Kitchen Difficulty breathing  . Swelling of your face and throat  . A fast heartbeat  . A bad rash all over your body  . Dizziness and weakness    Immunizations Administered    Name Date Dose VIS Date Route   Pfizer COVID-19 Vaccine 06/22/2019 12:10 PM 0.3 mL 05/08/2019 Intramuscular   Manufacturer: [place]   Lot: [id number]   [place]: [id number]

## 2019-07-08 ENCOUNTER — Encounter
Payer: No Typology Code available for payment source | Attending: Physical Medicine and Rehabilitation | Admitting: Physical Medicine and Rehabilitation

## 2019-07-08 ENCOUNTER — Other Ambulatory Visit: Payer: Self-pay

## 2019-07-08 ENCOUNTER — Encounter: Payer: Self-pay | Admitting: Physical Medicine and Rehabilitation

## 2019-07-08 VITALS — BP 117/76 | HR 81 | Temp 97.7°F | Ht 69.0 in | Wt 235.0 lb

## 2019-07-08 DIAGNOSIS — S22000A Wedge compression fracture of unspecified thoracic vertebra, initial encounter for closed fracture: Secondary | ICD-10-CM | POA: Diagnosis not present

## 2019-07-08 DIAGNOSIS — M549 Dorsalgia, unspecified: Secondary | ICD-10-CM | POA: Insufficient documentation

## 2019-07-08 DIAGNOSIS — G834 Cauda equina syndrome: Secondary | ICD-10-CM

## 2019-07-08 DIAGNOSIS — K592 Neurogenic bowel, not elsewhere classified: Secondary | ICD-10-CM | POA: Diagnosis not present

## 2019-07-08 DIAGNOSIS — M7918 Myalgia, other site: Secondary | ICD-10-CM | POA: Diagnosis not present

## 2019-07-08 DIAGNOSIS — M546 Pain in thoracic spine: Secondary | ICD-10-CM

## 2019-07-08 NOTE — Patient Instructions (Signed)
Plan: 1. Suggest suppository inserter-  Also has a digital stimulator device as well.  Can get form [place].    2. Patient here for trigger point injections for chronic pain  Consent done and on chart.  Cleaned areas with alcohol and injected using a 27 gauge 1.5 inch needle  Injected 6cc Using 1% Lidocaine with no EPI  Upper traps B/L Levators B/L- lot of twitch responses Posterior scalenes B/L Middle scalenes Splenius Capitus Pectoralis Major B/L Rhomboids B/L x2 B/L Infraspinatus Teres Major/minor Thoracic paraspinals on L Lumbar paraspinals B/L at T12/L1 Other injections-    Patient's level of pain prior was 5/10 Current level of pain after injections is 4/10- give it a little bit. Still impaired ROM.   There was no bleeding or complications.  Patient was advised to drink a lot of water on day after injections to flush system Will have increased soreness for 12-48 hours after injections.  Can use Lidocaine patches the day AFTER injections Can use theracane on day of injections in places didn't inject Can use heating pad 4-6 hours AFTER injections  3. Yolanda Bonine is building pt an AR-15- grandson is blind.   4. Good on refills.  5. TrP injections making enough difference that wants to continue them.   6. F/U in 4 weeks.
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
## 2019-07-13 ENCOUNTER — Ambulatory Visit: Payer: Medicare Other | Attending: Internal Medicine

## 2019-07-13 DIAGNOSIS — Z23 Encounter for immunization: Secondary | ICD-10-CM | POA: Insufficient documentation

## 2019-07-13 NOTE — Progress Notes (Signed)
   Covid-19 Vaccination Clinic  Name:  ALIC HILBURN    MRN: [id number] DOB: 02/12/1951  07/13/2019  Mr. Lina was observed post Covid-19 immunization for 15 minutes without incidence. He was provided with Vaccine Information Sheet and instruction to access the V-Safe system.   Mr. Leiner was instructed to call 911 with any severe reactions post vaccine: Marland Kitchen Difficulty breathing  . Swelling of your face and throat  . A fast heartbeat  . A bad rash all over your body  . Dizziness and weakness    Immunizations Administered    Name Date Dose VIS Date Route   Pfizer COVID-19 Vaccine 07/13/2019 11:46 AM 0.3 mL 05/08/2019 Intramuscular   Manufacturer: [place]   Lot: [id number]   [place]: [id number]

## 2019-07-23 ENCOUNTER — Other Ambulatory Visit: Payer: Self-pay | Admitting: Urology

## 2019-07-28 DIAGNOSIS — N210 Calculus in bladder: Secondary | ICD-10-CM | POA: Diagnosis not present

## 2019-07-28 DIAGNOSIS — R8271 Bacteriuria: Secondary | ICD-10-CM | POA: Diagnosis not present

## 2019-08-04 ENCOUNTER — Other Ambulatory Visit: Payer: Self-pay

## 2019-08-04 ENCOUNTER — Encounter (HOSPITAL_BASED_OUTPATIENT_CLINIC_OR_DEPARTMENT_OTHER): Payer: Self-pay | Admitting: Urology

## 2019-08-04 NOTE — Progress Notes (Addendum)
ADDENDUM:  Chart reviewed by anesthesia, Konrad Felix PA,  Including cardiac clearance from [place] in [place] received via fax from Dr Diona Fanti.  Ok to proceed.  Clearance is with chart.   Spoke w/ via phone for pre-op interview--- Pt and pt wife, Jocelyn Lamer Lab needs dos----   Istat 8            Lab results--- current ekg/ chest CT in epic/ chart COVID test ------ 08-06-2019 @ 1230 Arrive at ------- 1245 NPO after ------ MN w/ exception clear liquids until 0830 then nothing by mouth, this includes no chew tobacco (no cream/ milk products) Medications to take morning of surgery ----- Diabetic medication ----- do not take any diabetic medication morning of surgery Patient Special Instructions ----- n/a Pre-Op special Istructions ----- n/a Patient and patient wife verbalized understanding of instructions that were given at this phone interview. Patient denies shortness of breath, chest pain, fever, cough a this phone interview.   Anesthesia Review:  Hx lung cancer s/p LLLobecotomy 02/ 2011 (pt stated no recurrence) completed chemo 2011 and no radiation,  Hx PE 12-31-2014 (admission in epic) completed xarelto 07-2015 now that ASA;  AAA measures 4.3cm per last CT 04-03-2019 in epic.  Updated history in epic. Chart to be reviewed by anesthesia.  PCP:   Pt is followed by [facility] in [facility] Cardiologist : no Chest x-ray :  CT chest 04-03-2019 epic EKG :  04-03-2019 epic Echo : 01-02-2015 epic Stress test:  Pt stated had stress test approx. 2015 was told ok (unsure where or what type Cardiac Cath :  no Sleep Study/ CPAP : YES/ intolerant to cpap Fasting Blood Sugar :  140-150    / Checks Blood Sugar -- times a day:   Checks every other day in AM Blood Thinner/ Instructions /Last Dose: NO ASA / Instructions/ Last Dose : ASA 81mg /  Per Dr Alyson Ingles pt to remain on ASA, pt aware

## 2019-08-05 ENCOUNTER — Encounter
Payer: No Typology Code available for payment source | Attending: Physical Medicine and Rehabilitation | Admitting: Physical Medicine and Rehabilitation

## 2019-08-05 ENCOUNTER — Encounter: Payer: Self-pay | Admitting: Physical Medicine and Rehabilitation

## 2019-08-05 ENCOUNTER — Other Ambulatory Visit: Payer: Self-pay

## 2019-08-05 VITALS — BP 123/82 | HR 74 | Temp 97.8°F | Ht 69.0 in | Wt 236.0 lb

## 2019-08-05 DIAGNOSIS — K592 Neurogenic bowel, not elsewhere classified: Secondary | ICD-10-CM | POA: Diagnosis not present

## 2019-08-05 DIAGNOSIS — R200 Anesthesia of skin: Secondary | ICD-10-CM | POA: Diagnosis not present

## 2019-08-05 DIAGNOSIS — S22000A Wedge compression fracture of unspecified thoracic vertebra, initial encounter for closed fracture: Secondary | ICD-10-CM

## 2019-08-05 DIAGNOSIS — M7918 Myalgia, other site: Secondary | ICD-10-CM

## 2019-08-05 DIAGNOSIS — M549 Dorsalgia, unspecified: Secondary | ICD-10-CM | POA: Insufficient documentation
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
## 2019-08-05 NOTE — Patient Instructions (Signed)
Plan: 1. Patient here for trigger point injections for myofascial trigger points  Consent done and on chart.  Cleaned areas with alcohol and injected using a 27 gauge 1.5 inch needle  Injected 6cc  Using 1% Lidocaine with no EPI  Upper traps B/L Levators B/L Posterior scalenes B/L Middle scalenes Splenius Capitus B/L Pectoralis Major Rhomboids B/L Infraspinatus Teres Major/minor Thoracic paraspinals Lumbar paraspinals B/: x2 Other injections-    Patient's level of pain prior was 6/10 Current level of pain after injections is 4/10  There was no bleeding or complications.  Patient was advised to drink a lot of water on day after injections to flush system Will have increased soreness for 12-48 hours after injections.  Can use Lidocaine patches the day AFTER injections Can use theracane on day of injections in places didn't inject Can use heating pad 4-6 hours AFTER injections  2. Still not doing bowel program, but is going better.   3. Doesn't need refills on meds- changed pain meds, but working- doesn't know current meds. Next time bring bottles.   4. F/U 4-5 weeks for trigger points

## 2019-08-06 ENCOUNTER — Other Ambulatory Visit (HOSPITAL_COMMUNITY)
Admission: RE | Admit: 2019-08-06 | Discharge: 2019-08-06 | Disposition: A | Discharge status: Home / Self Care | Payer: Medicare Other | Source: Ambulatory Visit | Attending: Urology | Admitting: Urology

## 2019-08-06 DIAGNOSIS — Z20822 Contact with and (suspected) exposure to covid-19: Secondary | ICD-10-CM | POA: Insufficient documentation

## 2019-08-06 DIAGNOSIS — Z01812 Encounter for preprocedural laboratory examination: Secondary | ICD-10-CM | POA: Insufficient documentation

## 2019-08-06 LAB — SARS CORONAVIRUS 2 (TAT 6-24 HRS): SARS Coronavirus 2: NEGATIVE

## 2019-08-09 NOTE — Anesthesia Preprocedure Evaluation (Addendum)
Anesthesia Evaluation  Patient identified by MRN, date of birth, ID band Patient awake    Reviewed: Allergy & Precautions, NPO status , Patient's Chart, lab work & pertinent test results  History of Anesthesia Complications Negative for: history of anesthetic complications  Airway Mallampati: II  TM Distance: >3 FB Neck ROM: Full    Dental  (+) Edentulous Lower, Edentulous Upper   Pulmonary sleep apnea , former smoker,  Hx of lung ca s/p lobectomy 2011, Hx of submassive PE 2016   Pulmonary exam normal        Cardiovascular Normal cardiovascular exam  AAA (4.3cm in 2020)   Neuro/Psych    GI/Hepatic GERD  Medicated and Controlled,  Endo/Other  diabetes, Type 2, Oral Hypoglycemic Agents  Renal/GU    BPH    Musculoskeletal  (+) Arthritis ,   Abdominal   Peds  Hematology   Anesthesia Other Findings   Reproductive/Obstetrics                           Anesthesia Physical Anesthesia Plan  ASA: III  Anesthesia Plan: General   Post-op Pain Management:    Induction: Intravenous  PONV Risk Score and Plan: 3 and Treatment may vary due to age or medical condition and Ondansetron  Airway Management Planned: LMA  Additional Equipment: None  Intra-op Plan:   Post-operative Plan: Extubation in OR  Informed Consent: I have reviewed the patients History and Physical, chart, labs and discussed the procedure including the risks, benefits and alternatives for the proposed anesthesia with the patient or authorized representative who has indicated his/her understanding and acceptance.     Dental advisory given  Plan Discussed with: CRNA  Anesthesia Plan Comments:       Anesthesia Quick Evaluation

## 2019-08-10 ENCOUNTER — Ambulatory Visit (HOSPITAL_BASED_OUTPATIENT_CLINIC_OR_DEPARTMENT_OTHER): Payer: Medicare Other | Admitting: Physician Assistant

## 2019-08-10 ENCOUNTER — Encounter (HOSPITAL_BASED_OUTPATIENT_CLINIC_OR_DEPARTMENT_OTHER): Payer: Self-pay | Admitting: Urology

## 2019-08-10 ENCOUNTER — Other Ambulatory Visit: Payer: Self-pay

## 2019-08-10 ENCOUNTER — Encounter (HOSPITAL_BASED_OUTPATIENT_CLINIC_OR_DEPARTMENT_OTHER)
Admission: RE | Disposition: A | Discharge status: Home / Self Care | Payer: Self-pay | Source: Ambulatory Visit | Attending: Urology

## 2019-08-10 ENCOUNTER — Ambulatory Visit (HOSPITAL_BASED_OUTPATIENT_CLINIC_OR_DEPARTMENT_OTHER)
Admission: RE | Admit: 2019-08-10 | Discharge: 2019-08-10 | Disposition: A | Discharge status: Home / Self Care | Payer: Medicare Other | Source: Ambulatory Visit | Attending: Urology | Admitting: Urology

## 2019-08-10 DIAGNOSIS — Z902 Acquired absence of lung [part of]: Secondary | ICD-10-CM | POA: Diagnosis not present

## 2019-08-10 DIAGNOSIS — G4733 Obstructive sleep apnea (adult) (pediatric): Secondary | ICD-10-CM | POA: Insufficient documentation

## 2019-08-10 DIAGNOSIS — E119 Type 2 diabetes mellitus without complications: Secondary | ICD-10-CM | POA: Diagnosis not present

## 2019-08-10 DIAGNOSIS — N138 Other obstructive and reflux uropathy: Secondary | ICD-10-CM | POA: Insufficient documentation

## 2019-08-10 DIAGNOSIS — N210 Calculus in bladder: Secondary | ICD-10-CM | POA: Insufficient documentation

## 2019-08-10 DIAGNOSIS — Z85118 Personal history of other malignant neoplasm of bronchus and lung: Secondary | ICD-10-CM | POA: Diagnosis not present

## 2019-08-10 DIAGNOSIS — D494 Neoplasm of unspecified behavior of bladder: Secondary | ICD-10-CM | POA: Diagnosis not present

## 2019-08-10 DIAGNOSIS — K219 Gastro-esophageal reflux disease without esophagitis: Secondary | ICD-10-CM | POA: Insufficient documentation

## 2019-08-10 DIAGNOSIS — Z96652 Presence of left artificial knee joint: Secondary | ICD-10-CM | POA: Insufficient documentation

## 2019-08-10 DIAGNOSIS — I714 Abdominal aortic aneurysm, without rupture: Secondary | ICD-10-CM | POA: Insufficient documentation

## 2019-08-10 DIAGNOSIS — Z86711 Personal history of pulmonary embolism: Secondary | ICD-10-CM | POA: Diagnosis not present

## 2019-08-10 DIAGNOSIS — C674 Malignant neoplasm of posterior wall of bladder: Secondary | ICD-10-CM | POA: Insufficient documentation

## 2019-08-10 DIAGNOSIS — Z87891 Personal history of nicotine dependence: Secondary | ICD-10-CM | POA: Diagnosis not present

## 2019-08-10 DIAGNOSIS — N401 Enlarged prostate with lower urinary tract symptoms: Secondary | ICD-10-CM | POA: Insufficient documentation

## 2019-08-10 DIAGNOSIS — Z7984 Long term (current) use of oral hypoglycemic drugs: Secondary | ICD-10-CM | POA: Insufficient documentation

## 2019-08-10 DIAGNOSIS — C679 Malignant neoplasm of bladder, unspecified: Secondary | ICD-10-CM | POA: Diagnosis not present

## 2019-08-10 HISTORY — DX: Personal history of other malignant neoplasm of bronchus and lung: Z85.118

## 2019-08-10 HISTORY — DX: Type 2 diabetes mellitus without complications: E11.9

## 2019-08-10 HISTORY — DX: Benign prostatic hyperplasia without lower urinary tract symptoms: N40.0

## 2019-08-10 HISTORY — DX: Presence of spectacles and contact lenses: Z97.3

## 2019-08-10 HISTORY — DX: Abdominal aortic aneurysm, without rupture, unspecified: I71.40

## 2019-08-10 HISTORY — PX: CYSTOSCOPY WITH INSERTION OF UROLIFT: SHX6678

## 2019-08-10 HISTORY — DX: Chronic gout, unspecified, without tophus (tophi): M1A.9XX0

## 2019-08-10 HISTORY — DX: Collapsed vertebra, not elsewhere classified, site unspecified, initial encounter for fracture: M48.50XA

## 2019-08-10 HISTORY — DX: Complete loss of teeth, unspecified cause, unspecified class: K08.109

## 2019-08-10 HISTORY — DX: Obstructive sleep apnea (adult) (pediatric): G47.33

## 2019-08-10 HISTORY — DX: Other complications of anesthesia, initial encounter: T88.59XA

## 2019-08-10 HISTORY — PX: CYSTOSCOPY WITH LITHOLAPAXY: SHX1425

## 2019-08-10 HISTORY — DX: Personal history of other medical treatment: Z92.89

## 2019-08-10 HISTORY — DX: Other chronic pain: G89.29

## 2019-08-10 HISTORY — DX: Personal history of other (healed) physical injury and trauma: Z87.828

## 2019-08-10 HISTORY — DX: Mixed hyperlipidemia: E78.2

## 2019-08-10 HISTORY — DX: Other intervertebral disc degeneration, thoracolumbar region: M51.35

## 2019-08-10 HISTORY — DX: Abdominal aortic aneurysm, without rupture: I71.4

## 2019-08-10 HISTORY — DX: Personal history of pulmonary embolism: Z86.711

## 2019-08-10 HISTORY — DX: Diverticulum of bladder: N32.3

## 2019-08-10 LAB — POCT I-STAT, CHEM 8
BUN: 15 mg/dL (ref 8–23)
Calcium, Ion: 1.25 mmol/L (ref 1.15–1.40)
Chloride: 106 mmol/L (ref 98–111)
Creatinine, Ser: 0.9 mg/dL (ref 0.61–1.24)
Glucose, Bld: 126 mg/dL — ABNORMAL HIGH (ref 70–99)
HCT: 42 % (ref 39.0–52.0)
Hemoglobin: 14.3 g/dL (ref 13.0–17.0)
Potassium: 3.9 mmol/L (ref 3.5–5.1)
Sodium: 140 mmol/L (ref 135–145)
TCO2: 26 mmol/L (ref 22–32)

## 2019-08-10 LAB — GLUCOSE, CAPILLARY: Glucose-Capillary: 117 mg/dL — ABNORMAL HIGH (ref 70–99)

## 2019-08-10 SURGERY — CYSTOSCOPY, WITH BLADDER CALCULUS LITHOLAPAXY
Anesthesia: General | Site: Prostate

## 2019-08-10 MED ORDER — MIDAZOLAM HCL 2 MG/2ML IJ SOLN
INTRAMUSCULAR | Status: DC | PRN
  Administered 2019-08-10: 1 mg via INTRAVENOUS

## 2019-08-10 MED ORDER — BELLADONNA ALKALOIDS-OPIUM 16.2-30 MG RE SUPP
30.0000 mg | Freq: Once | RECTAL | Status: DC
  Filled 2019-08-10: qty 1

## 2019-08-10 MED ORDER — TRAMADOL HCL 50 MG PO TABS
ORAL_TABLET | ORAL | Status: AC
  Filled 2019-08-10: qty 1

## 2019-08-10 MED ORDER — BELLADONNA ALKALOIDS-OPIUM 16.2-30 MG RE SUPP
RECTAL | Status: AC
  Filled 2019-08-10: qty 1

## 2019-08-10 MED ORDER — DEXAMETHASONE SODIUM PHOSPHATE 10 MG/ML IJ SOLN
INTRAMUSCULAR | Status: DC | PRN
  Administered 2019-08-10: 4 mg via INTRAVENOUS

## 2019-08-10 MED ORDER — FENTANYL CITRATE (PF) 100 MCG/2ML IJ SOLN
INTRAMUSCULAR | Status: DC | PRN
  Administered 2019-08-10 (×2): 50 ug via INTRAVENOUS

## 2019-08-10 MED ORDER — LACTATED RINGERS IV SOLN
INTRAVENOUS | Status: DC
  Administered 2019-08-10: 1000 mL via INTRAVENOUS
  Filled 2019-08-10: qty 1000

## 2019-08-10 MED ORDER — PROMETHAZINE HCL 25 MG/ML IJ SOLN
6.2500 mg | INTRAMUSCULAR | Status: DC | PRN
  Filled 2019-08-10: qty 1

## 2019-08-10 MED ORDER — ACETAMINOPHEN 500 MG PO TABS
ORAL_TABLET | ORAL | Status: AC
  Filled 2019-08-10: qty 2

## 2019-08-10 MED ORDER — PROPOFOL 10 MG/ML IV BOLUS
INTRAVENOUS | Status: AC
  Filled 2019-08-10: qty 20

## 2019-08-10 MED ORDER — DEXAMETHASONE SODIUM PHOSPHATE 10 MG/ML IJ SOLN
INTRAMUSCULAR | Status: AC
  Filled 2019-08-10: qty 1

## 2019-08-10 MED ORDER — TRAMADOL HCL 50 MG PO TABS: 50.0000 mg | ORAL_TABLET | Freq: Four times a day (QID) | ORAL | 0 refills | Status: DC | PRN

## 2019-08-10 MED ORDER — MIDAZOLAM HCL 2 MG/2ML IJ SOLN
INTRAMUSCULAR | Status: AC
  Filled 2019-08-10: qty 2

## 2019-08-10 MED ORDER — PROPOFOL 10 MG/ML IV BOLUS
INTRAVENOUS | Status: DC | PRN
  Administered 2019-08-10: 170 mg via INTRAVENOUS

## 2019-08-10 MED ORDER — TRAMADOL HCL 50 MG PO TABS
50.0000 mg | ORAL_TABLET | Freq: Once | ORAL | Status: AC
  Administered 2019-08-10: 50 mg via ORAL
  Filled 2019-08-10: qty 1

## 2019-08-10 MED ORDER — STERILE WATER FOR IRRIGATION IR SOLN
Status: DC | PRN
  Administered 2019-08-10: 3000 mL

## 2019-08-10 MED ORDER — FENTANYL CITRATE (PF) 100 MCG/2ML IJ SOLN
INTRAMUSCULAR | Status: AC
  Filled 2019-08-10: qty 2

## 2019-08-10 MED ORDER — CEFAZOLIN SODIUM-DEXTROSE 2-4 GM/100ML-% IV SOLN
INTRAVENOUS | Status: AC
  Filled 2019-08-10: qty 100

## 2019-08-10 MED ORDER — ACETAMINOPHEN 500 MG PO TABS
1000.0000 mg | ORAL_TABLET | Freq: Once | ORAL | Status: AC
  Administered 2019-08-10: 1000 mg via ORAL
  Filled 2019-08-10: qty 2

## 2019-08-10 MED ORDER — LIDOCAINE 2% (20 MG/ML) 5 ML SYRINGE
INTRAMUSCULAR | Status: DC | PRN
  Administered 2019-08-10: 100 mg via INTRAVENOUS

## 2019-08-10 MED ORDER — EPHEDRINE SULFATE-NACL 50-0.9 MG/10ML-% IV SOSY
PREFILLED_SYRINGE | INTRAVENOUS | Status: DC | PRN
  Administered 2019-08-10: 15 mg via INTRAVENOUS

## 2019-08-10 MED ORDER — LIDOCAINE 2% (20 MG/ML) 5 ML SYRINGE
INTRAMUSCULAR | Status: AC
  Filled 2019-08-10: qty 5

## 2019-08-10 MED ORDER — ONDANSETRON HCL 4 MG/2ML IJ SOLN
INTRAMUSCULAR | Status: AC
  Filled 2019-08-10: qty 2

## 2019-08-10 MED ORDER — ONDANSETRON HCL 4 MG/2ML IJ SOLN
INTRAMUSCULAR | Status: DC | PRN
  Administered 2019-08-10: 4 mg via INTRAVENOUS

## 2019-08-10 MED ORDER — FENTANYL CITRATE (PF) 100 MCG/2ML IJ SOLN
25.0000 ug | INTRAMUSCULAR | Status: DC | PRN
  Administered 2019-08-10 (×2): 25 ug via INTRAVENOUS
  Filled 2019-08-10: qty 1

## 2019-08-10 MED ORDER — CEFAZOLIN SODIUM-DEXTROSE 2-4 GM/100ML-% IV SOLN
2.0000 g | INTRAVENOUS | Status: AC
  Administered 2019-08-10: 15:00:00 2 g via INTRAVENOUS
  Filled 2019-08-10: qty 100

## 2019-08-10 SURGICAL SUPPLY — 25 items
BAG DRAIN URO-CYSTO SKYTR STRL (DRAIN) ×4 IMPLANT
BAG DRN RND TRDRP ANRFLXCHMBR (UROLOGICAL SUPPLIES) ×2
BAG DRN UROCATH (DRAIN) ×2
BAG URINE DRAIN 2000ML AR STRL (UROLOGICAL SUPPLIES) ×4 IMPLANT
CATH FOLEY 2WAY SLVR  5CC 16FR (CATHETERS) ×4
CATH FOLEY 2WAY SLVR 5CC 16FR (CATHETERS) ×2 IMPLANT
CLOTH BEACON ORANGE TIMEOUT ST (SAFETY) ×2 IMPLANT
DRSG TELFA 3X8 NADH (GAUZE/BANDAGES/DRESSINGS) ×4 IMPLANT
FIBER LASER FLEXIVA 1000 (UROLOGICAL SUPPLIES) ×2 IMPLANT
FIBER LASER FLEXIVA 550 (UROLOGICAL SUPPLIES) IMPLANT
GLOVE BIO SURGEON STRL SZ8 (GLOVE) ×4 IMPLANT
GOWN STRL REUS W/TWL XL LVL3 (GOWN DISPOSABLE) ×4 IMPLANT
HOLDER FOLEY CATH W/STRAP (MISCELLANEOUS) ×2 IMPLANT
IV NS IRRIG 3000ML ARTHROMATIC (IV SOLUTION) ×4 IMPLANT
KIT TURNOVER CYSTO (KITS) ×4 IMPLANT
MANIFOLD NEPTUNE II (INSTRUMENTS) ×4 IMPLANT
NS IRRIG 500ML POUR BTL (IV SOLUTION) ×4 IMPLANT
PACK CYSTO (CUSTOM PROCEDURE TRAY) ×4 IMPLANT
PAD DRESSING TELFA 3X8 NADH (GAUZE/BANDAGES/DRESSINGS) IMPLANT
SYR 10ML LL (SYRINGE) ×4 IMPLANT
SYSTEM UROLIFT (Male Continence) ×8 IMPLANT
TUBE CONNECTING 12'X1/4 (SUCTIONS) ×1
TUBE CONNECTING 12X1/4 (SUCTIONS) ×3 IMPLANT
TUBING UROLOGY SET (TUBING) ×4 IMPLANT
WATER STERILE IRR 3000ML UROMA (IV SOLUTION) ×2 IMPLANT

## 2019-08-10 NOTE — Anesthesia Postprocedure Evaluation (Signed)
Anesthesia Post Note  Patient: Harold Morris  Procedure(s) Performed: CYSTOSCOPY WITH LITHOLAPAXY, BLADDER BIOPSY, FULGERATION (N/A Bladder) CYSTOSCOPY WITH INSERTION OF UROLIFT (N/A Prostate)     Patient location during evaluation: PACU Anesthesia Type: General Level of consciousness: awake Pain management: pain level controlled Vital Signs Assessment: post-procedure vital signs reviewed and stable Respiratory status: spontaneous breathing Cardiovascular status: stable Postop Assessment: no apparent nausea or vomiting Anesthetic complications: no    Last Vitals:  Vitals:   08/10/19 1545 08/10/19 1600  BP: (!) 152/88 (!) 150/89  Pulse: 66 77  Resp: 16 20  Temp:    SpO2: 100% 97%    Last Pain:  Vitals:   08/10/19 1545  TempSrc:   PainSc: 9                  Lillianah Swartzentruber

## 2019-08-10 NOTE — Anesthesia Procedure Notes (Addendum)
Procedure Name: LMA Insertion Date/Time: 08/10/2019 2:30 PM Performed by: Mechele Claude, CRNA Pre-anesthesia Checklist: Patient identified, Emergency Drugs available, Suction available and Patient being monitored Patient Re-evaluated:Patient Re-evaluated prior to induction Oxygen Delivery Method: Circle system utilized Preoxygenation: Pre-oxygenation with 100% oxygen Induction Type: IV induction Ventilation: Mask ventilation without difficulty LMA: LMA inserted LMA Size: 5.0 Number of attempts: 1 Airway Equipment and Method: Bite block Placement Confirmation: positive ETCO2 Tube secured with: Tape Dental Injury: Teeth and Oropharynx as per pre-operative assessment
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
## 2019-08-10 NOTE — Op Note (Signed)
   PREOPERATIVE DIAGNOSIS: Benign prostatic hypertrophy with bladder outlet obstruction, bladder calculus  POSTOPERATIVE DIAGNOSIS: Benign prostatic hypertrophy with bladder outlet obstruction, bladder calculus, bladder tumor  PROCEDURE: Cystoscopy with implantation of UroLift devices, 4 implants.  SURGEON: Nicolette Bang, M.D.  ANESTHESIA: General  ANTIBIOTICS: ancef  SPECIMEN: 1. Bladder calculus 2. Bladder tumor  DRAINS: A 16-French Foley catheter.  BLOOD LOSS: Minimal.  COMPLICATIONS: None.  FINDINGS: 1cm bladder calculus in right posterior diverticulum. Carpeting papillary lesions in the right posterior wall diverticulum.   INDICATIONS:The Patient is an 69 year old male with BPH and bladder outlet obstruction. He has failed medical therapy and has elected UroLift for definitive treatment.  FINDINGS OF PROCEDURE: He was taken to the operating room where a genral anesthetic was induced. He was placed in lithotomy position and was fitted with PAS hose. His perineum and genitalia were prepped with chlorhexidine, and he was draped in usual sterile fashion.  Cystoscopy was performed using the UroLift scope and 0 degree lens. Examination revealed a normal urethra. The external sphincter was intact. Prostatic urethra was approximately 4 cm in length with lateral lobe enlargement. There was also little bit of bladder neck elevation. Inspection of bladder revealed mild-to-moderate trabeculation. A right posterior wall diverticulum was noted with a 1cm bladder calculus and multiple papillary tumors. Using a 1000nm laser fiber the stone was fragmented and the fragments were evacuated from the bladder. We then used the biopsy forceps to obtain multiple biopsies from the bladder tumor. Using a bugbee the tumor was fulgerated.  After initial cystoscopy, the visual obturator was replaced with the first UroLift device. This was turned to the 9 o'clock position  and pulled back to the veru and then slightly advanced. Pressure was then applied to the right lateral lobe and the UroLift device was deployed.  The second UroLift device was then inserted and applied to the left lateral lobe at 3 o'clock and deployed in the mid prostatic urethra. After this, there was still some apparent obstruction closer to the bladder neck. So a second level of UroLift your left device was applied between the mid urethra and the proximal urethra providing further patency to the prostatic urethra. At this point, there was mild bleeding but the patient did have a spinal anesthetic. So it was thought that a Foley catheter was indicated. The scope was removed and a 16-French Foley catheter was inserted without difficulty. The balloon was filled with 10 mL sterile fluid, and the catheter was placed to straight drainage.  COMPLICATIONS: None   CONDITION: Stable, extubated, transferred to PACU  PLAN: The patient will be discharged home and followup in 2 days for a voiding trial.

## 2019-08-10 NOTE — Progress Notes (Signed)
Pt w known hx OSA. Denies use of CPAP 'I can't use it" . Pt resting in re4cliner, no facial grimaces, admits to feeling "pretty comfortable for now" B&O held per wifes refusal. Pt has anesthesia, tramadol and feels pretty good. Pt and wife informed of concern w added narcotic, which may cause extra drowsiness , w/o cpap use at home. Pt verbalized his understanding, but again states , I cant use that. B&O held. Tramadol appears to have brought some comfort.

## 2019-08-10 NOTE — Transfer of Care (Signed)
    Last Vitals:  Vitals Value Taken Time  BP 139/82 08/10/19 1513  Temp    Pulse 79 08/10/19 1516  Resp 14 08/10/19 1516  SpO2 100 % 08/10/19 1516  Vitals shown include unvalidated device data.  Last Pain:  Vitals:   08/10/19 1318  TempSrc: Oral  PainSc: 2       Patients Stated Pain Goal: 2 (08/10/19 1318)  Immediate Anesthesia Transfer of Care Note  Patient: Harold Morris  Procedure(s) Performed: Procedure(s) (LRB): CYSTOSCOPY WITH LITHOLAPAXY, BLADDER BIOPSY, FULGERATION (N/A) CYSTOSCOPY WITH INSERTION OF UROLIFT (N/A)  Patient Location: PACU  Anesthesia Type: General  Level of Consciousness: awake, alert  and oriented  Airway & Oxygen Therapy: Patient Spontanous Breathing and Patient connected to nasal cannula oxygen  Post-op Assessment: Report given to PACU RN and Post -op Vital signs reviewed and stable  Post vital signs: Reviewed and stable  Complications: No apparent anesthesia complications

## 2019-08-10 NOTE — Discharge Instructions (Signed)
Indwelling Urinary Catheter Care, Adult An indwelling urinary catheter is a thin tube that is put into your bladder. The tube helps to drain pee (urine) out of your body. The tube goes in through your urethra. Your urethra is where pee comes out of your body. Your pee will come out through the catheter, then it will go into a bag (drainage bag). Take good care of your catheter so it will work well. How to wear your catheter and bag Supplies needed  Sticky tape (adhesive tape) or a leg strap.  Alcohol wipe or soap and water (if you use tape).  A clean towel (if you use tape).  Large overnight bag.  Smaller bag (leg bag). Wearing your catheter Attach your catheter to your leg with tape or a leg strap.  Make sure the catheter is not pulled tight.  If a leg strap gets wet, take it off and put on a dry strap.  If you use tape to hold the bag on your leg: 1. Use an alcohol wipe or soap and water to wash your skin where the tape made it sticky before. 2. Use a clean towel to pat-dry that skin. 3. Use new tape to make the bag stay on your leg. Wearing your bags You should have been given a large overnight bag.  You may wear the overnight bag in the day or night.  Always have the overnight bag lower than your bladder.  Do not let the bag touch the floor.  Before you go to sleep, put a clean plastic bag in a wastebasket. Then hang the overnight bag inside the wastebasket. You should also have a smaller leg bag that fits under your clothes.  Always wear the leg bag below your knee.  Do not wear your leg bag at night. How to care for your skin and catheter Supplies needed  A clean washcloth.  Water and mild soap.  A clean towel. Caring for your skin and catheter      Clean the skin around your catheter every day: 1. Wash your hands with soap and water. 2. Wet a clean washcloth in warm water and mild soap. 3. Clean the skin around your urethra.  If you are  male:  Gently spread the folds of skin around your vagina (labia).  With the washcloth in your other hand, wipe the inner side of your labia on each side. Wipe from front to back.  If you are male:  Pull back any skin that covers the end of your penis (foreskin).  With the washcloth in your other hand, wipe your penis in small circles. Start wiping at the tip of your penis, then move away from the catheter.  Move the foreskin back in place, if needed. 4. With your free hand, hold the catheter close to where it goes into your body.  Keep holding the catheter during cleaning so it does not get pulled out. 5. With the washcloth in your other hand, clean the catheter.  Only wipe downward on the catheter.  Do not wipe upward toward your body. Doing this may push germs into your urethra and cause infection. 6. Use a clean towel to pat-dry the catheter and the skin around it. Make sure to wipe off all soap. 7. Wash your hands with soap and water.  Shower every day. Do not take baths.  Do not use cream, ointment, or lotion on the area where the catheter goes into your body, unless your doctor tells you  to.  Do not use powders, sprays, or lotions on your genital area.  Check your skin around the catheter every day for signs of infection. Check for: ? Redness, swelling, or pain. ? Fluid or blood. ? Warmth. ? Pus or a bad smell. How to empty the bag Supplies needed  Rubbing alcohol.  Gauze pad or cotton ball.  Tape or a leg strap. Emptying the bag Pour the pee out of your bag when it is ?- full, or at least 2-3 times a day. Do this for your overnight bag and your leg bag. 1. Wash your hands with soap and water. 2. Separate (detach) the bag from your leg. 3. Hold the bag over the toilet or a clean pail. Keep the bag lower than your hips and bladder. This is so the pee (urine) does not go back into the tube. 4. Open the pour spout. It is at the bottom of the bag. 5. Empty the  pee into the toilet or pail. Do not let the pour spout touch any surface. 6. Put rubbing alcohol on a gauze pad or cotton ball. 7. Use the gauze pad or cotton ball to clean the pour spout. 8. Close the pour spout. 9. Attach the bag to your leg with tape or a leg strap. 10. Wash your hands with soap and water. Follow instructions for cleaning the drainage bag:  From the product maker.  As told by your doctor. How to change the bag Supplies needed  Alcohol wipes.  A clean bag.  Tape or a leg strap. Changing the bag Replace your bag when it starts to leak, smell bad, or look dirty. 1. Wash your hands with soap and water. 2. Separate the dirty bag from your leg. 3. Pinch the catheter with your fingers so that pee does not spill out. 4. Separate the catheter tube from the bag tube where these tubes connect (at the connection valve). Do not let the tubes touch any surface. 5. Clean the end of the catheter tube with an alcohol wipe. Use a different alcohol wipe to clean the end of the bag tube. 6. Connect the catheter tube to the tube of the clean bag. 7. Attach the clean bag to your leg with tape or a leg strap. Do not make the bag tight on your leg. 8. Wash your hands with soap and water. General rules   Never pull on your catheter. Never try to take it out. Doing that can hurt you.  Always wash your hands before and after you touch your catheter or bag. Use a mild, fragrance-free soap. If you do not have soap and water, use hand sanitizer.  Always make sure there are no twists or bends (kinks) in the catheter tube.  Always make sure there are no leaks in the catheter or bag.  Drink enough fluid to keep your pee pale yellow.  Do not take baths, swim, or use a hot tub.  If you are male, wipe from front to back after you poop (have a bowel movement). Contact a doctor if:  Your pee is cloudy.  Your pee smells worse than usual.  Your catheter gets clogged.  Your catheter  leaks.  Your bladder feels full. Get help right away if:  You have redness, swelling, or pain where the catheter goes into your body.  You have fluid, blood, pus, or a bad smell coming from the area where the catheter goes into your body.  Your skin feels warm where  the catheter goes into your body.  You have a fever.  You have pain in your: ? Belly (abdomen). ? Legs. ? Lower back. ? Bladder.  You see blood in the catheter.  Your pee is pink or red.  You feel sick to your stomach (nauseous).  You throw up (vomit).  You have chills.  Your pee is not draining into the bag.  Your catheter gets pulled out. Summary  An indwelling urinary catheter is a thin tube that is placed into the bladder to help drain pee (urine) out of the body.  The catheter is placed into the part of the body that drains pee from the bladder (urethra).  Taking good care of your catheter will keep it working properly and help prevent problems.  Always wash your hands before and after touching your catheter or bag.  Never pull on your catheter or try to take it out. This information is not intended to replace advice given to you by your health care provider. Make sure you discuss any questions you have with your health care provider. Document Revised: 09/05/2018 Document Reviewed: 12/28/2016 Elsevier Patient Education  [place] CARE INSTRUCTIONS  Activity: Rest for the remainder of the day.  Do not drive or operate equipment today.  You may resume normal activities in one to two days as instructed by your physician.   Meals: Drink plenty of liquids and eat light foods such as gelatin or soup this evening.  You may return to a normal meal plan tomorrow.  Return to Work: You may return to work in one to two days or as instructed by your physician.  Special Instructions / Symptoms: Call your physician if any of these symptoms occur:   -persistent or heavy  bleeding  -bleeding which continues after first few urination  -large blood clots that are difficult to pass  -urine stream diminishes or stops completely  -fever equal to or higher than 101 degrees Farenheit.  -cloudy urine with a strong, foul odor  -severe pain  Females should always wipe from front to back after elimination.  You may feel some burning pain when you urinate.  This should disappear with time.  Applying moist heat to the lower abdomen or a hot tub bath may help relieve the pain. \  Follow-Up / Date of Return Visit to Your Physician: as instructed Call for an appointment to arrange follow-up.  Patient Signature:  ________________________________________________________  Nurse's Signature:  ________________________________________________________    Post Anesthesia Home Care Instructions  Activity: Get plenty of rest for the remainder of the day. A responsible individual must stay with you for 24 hours following the procedure.  For the next 24 hours, DO NOT: -Drive a car -Paediatric nurse -Drink alcoholic beverages -Take any medication unless instructed by your physician -Make any legal decisions or sign important papers.  Meals: Start with liquid foods such as gelatin or soup. Progress to regular foods as tolerated. Avoid greasy, spicy, heavy foods. If nausea and/or vomiting occur, drink only clear liquids until the nausea and/or vomiting subsides. Call your physician if vomiting continues.  Special Instructions/Symptoms: Your throat may feel dry or sore from the anesthesia or the breathing tube placed in your throat during surgery. If this causes discomfort, gargle with warm salt water. The discomfort should disappear within 24 hours.  If you had a scopolamine patch placed behind your ear for the management of post- operative nausea and/or vomiting:  1. The medication in the patch  is effective for 72 hours, after which it should be removed.  Wrap patch in a  tissue and discard in the trash. Wash hands thoroughly with soap and water. 2. You may remove the patch earlier than 72 hours if you experience unpleasant side effects which may include dry mouth, dizziness or visual disturbances. 3. Avoid touching the patch. Wash your hands with soap and water after contact with the patch.

## 2019-08-12 DIAGNOSIS — D414 Neoplasm of uncertain behavior of bladder: Secondary | ICD-10-CM | POA: Diagnosis not present

## 2019-08-12 DIAGNOSIS — R350 Frequency of micturition: Secondary | ICD-10-CM | POA: Diagnosis not present

## 2019-08-12 DIAGNOSIS — N210 Calculus in bladder: Secondary | ICD-10-CM | POA: Diagnosis not present

## 2019-08-12 LAB — SURGICAL PATHOLOGY

## 2019-08-20 DIAGNOSIS — M545 Low back pain: Secondary | ICD-10-CM | POA: Diagnosis not present

## 2019-08-20 DIAGNOSIS — R300 Dysuria: Secondary | ICD-10-CM | POA: Diagnosis not present

## 2019-08-24 ENCOUNTER — Other Ambulatory Visit: Payer: Self-pay

## 2019-08-24 ENCOUNTER — Telehealth: Payer: Self-pay | Admitting: Physical Medicine and Rehabilitation

## 2019-08-24 ENCOUNTER — Encounter (HOSPITAL_BASED_OUTPATIENT_CLINIC_OR_DEPARTMENT_OTHER): Payer: No Typology Code available for payment source | Admitting: Physical Medicine and Rehabilitation

## 2019-08-24 ENCOUNTER — Encounter: Payer: Self-pay | Admitting: Physical Medicine and Rehabilitation

## 2019-08-24 VITALS — BP 127/85 | HR 76 | Temp 97.7°F | Ht 69.0 in | Wt 234.0 lb

## 2019-08-24 DIAGNOSIS — N319 Neuromuscular dysfunction of bladder, unspecified: Secondary | ICD-10-CM

## 2019-08-24 DIAGNOSIS — G8921 Chronic pain due to trauma: Secondary | ICD-10-CM | POA: Diagnosis not present

## 2019-08-24 DIAGNOSIS — S22000A Wedge compression fracture of unspecified thoracic vertebra, initial encounter for closed fracture: Secondary | ICD-10-CM | POA: Diagnosis not present

## 2019-08-24 DIAGNOSIS — R200 Anesthesia of skin: Secondary | ICD-10-CM | POA: Diagnosis not present

## 2019-08-24 DIAGNOSIS — M549 Dorsalgia, unspecified: Secondary | ICD-10-CM | POA: Diagnosis not present

## 2019-08-24 MED ORDER — TAMSULOSIN HCL 0.4 MG PO CAPS: 0.4000 mg | ORAL_CAPSULE | Freq: Every day | ORAL | 5 refills | Status: DC

## 2019-08-24 MED ORDER — TRAMADOL HCL 50 MG PO TABS: 50.0000 mg | ORAL_TABLET | Freq: Four times a day (QID) | ORAL | 3 refills | Status: DC | PRN

## 2019-08-24 NOTE — Patient Instructions (Signed)
Patient is a 69 yr old male with hx of T6 and T12 compression fx's and T10 SCI level.    1. Tramadol 50 mg q6 hours as needed- can break tablet in 1/2 to 25 mg  #120.   2. Will call pt's [facility]U to ask about compression fracture-   3.  Crystal Light- or [place]- to make water more tasty.   4.  Flomax- for BPH -.4 mg PO every dinner time- to get pt voiding more, less frequently.   5. Needs workup for passing out- concerned about that.  Call [facility]. Call [facility] today.   6. F/U as scheduled.
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
## 2019-08-24 NOTE — Telephone Encounter (Signed)
Patient request a call from Dr Dagoberto Ligas.  Did not elaborate on why, advised Mr Colberg that Dr Dagoberto Ligas was in with patients, and he stated he needs a call as soon as she is done.

## 2019-08-24 NOTE — Telephone Encounter (Signed)
Saw pt in clinic- will deal with in note.

## 2019-09-02 ENCOUNTER — Encounter: Payer: Self-pay | Admitting: Physical Medicine and Rehabilitation

## 2019-09-02 ENCOUNTER — Other Ambulatory Visit: Payer: Self-pay

## 2019-09-02 ENCOUNTER — Encounter
Payer: Medicare Other | Attending: Physical Medicine and Rehabilitation | Admitting: Physical Medicine and Rehabilitation

## 2019-09-02 VITALS — BP 136/84 | HR 89 | Temp 97.3°F | Ht 69.5 in | Wt 232.6 lb

## 2019-09-02 DIAGNOSIS — G8921 Chronic pain due to trauma: Secondary | ICD-10-CM

## 2019-09-02 DIAGNOSIS — R200 Anesthesia of skin: Secondary | ICD-10-CM | POA: Diagnosis not present

## 2019-09-02 DIAGNOSIS — M549 Dorsalgia, unspecified: Secondary | ICD-10-CM | POA: Insufficient documentation

## 2019-09-02 DIAGNOSIS — M7918 Myalgia, other site: Secondary | ICD-10-CM | POA: Diagnosis not present

## 2019-09-02 DIAGNOSIS — G834 Cauda equina syndrome: Secondary | ICD-10-CM

## 2019-09-02 NOTE — Progress Notes (Signed)
  Patient is a 69 yr old male with hx of T6 and T12 compression fx's and T10 SCI level.   According to wife, did not black out but might not remember the situation.    Got bladder infection cleared up- bladder pain is resolved now, but still having some back pain.   Tramadol doing well for pain- bringing pain down some/pretty well.  Most has taken 2-3x/day of tramadol.   When feet hits the floor- when abd doesn't hurt ~ T10 When full of poop- puts pressure on abd/T10 and causes more pain.     A/P: Patient is a 69 yr old male with hx of T6 and T12 compression fx's and T10 SCI level- with at level SCI pain.  Patient here for trigger point injections for myofascial pain  Consent done and on chart.  Cleaned areas with alcohol and injected using a 27 gauge 1.5 inch needle  Injected 6 cc Using 1% Lidocaine with no EPI  Upper traps B/L Levators B/L Posterior scalenes B/L Middle scalenes Splenius Capitus B/L Pectoralis Major Rhomboids Infraspinatus Teres Major/minor Thoracic paraspinals B/L x2 Lumbar paraspinals Other injections-    Patient's level of pain prior was - 2-3/10 Current level of pain after injections is 1/10  There was no bleeding or complications.  Patient was advised to drink a lot of water on day after injections to flush system Will have increased soreness for 12-48 hours after injections.  Can use Lidocaine patches the day AFTER injections Can use theracane on day of injections in places didn't inject Can use heating pad 4-6 hours AFTER injections  2. Takes Miralax- 1/2 dose to 1 dose/day.   3. Please don't overdo it- can wear back brace to help.   4. F/U in 4 weeks for trigger point injections

## 2019-09-02 NOTE — Patient Instructions (Signed)
Patient here for trigger point injections for myofascial pain  Consent done and on chart.  Cleaned areas with alcohol and injected using a 27 gauge 1.5 inch needle  Injected 6 cc Using 1% Lidocaine with no EPI  Upper traps B/L Levators B/L Posterior scalenes B/L Middle scalenes Splenius Capitus B/L Pectoralis Major Rhomboids Infraspinatus Teres Major/minor Thoracic paraspinals B/L x2 Lumbar paraspinals Other injections-    Patient's level of pain prior was - 2-3/10 Current level of pain after injections is 1/10  There was no bleeding or complications.  Patient was advised to drink a lot of water on day after injections to flush system Will have increased soreness for 12-48 hours after injections.  Can use Lidocaine patches the day AFTER injections Can use theracane on day of injections in places didn't inject Can use heating pad 4-6 hours AFTER injections  2. Takes Miralax- 1/2 dose to 1 dose/day.   3. Please don't overdo it- can wear back brace to help.   4. F/U in 6 weeks for trigger point injections

## 2019-09-22 DIAGNOSIS — R3915 Urgency of urination: Secondary | ICD-10-CM | POA: Diagnosis not present

## 2019-09-22 DIAGNOSIS — C674 Malignant neoplasm of posterior wall of bladder: Secondary | ICD-10-CM | POA: Diagnosis not present

## 2019-10-14 ENCOUNTER — Ambulatory Visit: Payer: Medicare Other | Admitting: Physical Medicine and Rehabilitation

## 2019-10-16 ENCOUNTER — Other Ambulatory Visit: Payer: Self-pay

## 2019-10-16 ENCOUNTER — Encounter: Payer: Self-pay | Admitting: Physical Medicine and Rehabilitation

## 2019-10-16 ENCOUNTER — Encounter
Payer: Medicare Other | Attending: Physical Medicine and Rehabilitation | Admitting: Physical Medicine and Rehabilitation

## 2019-10-16 VITALS — BP 150/92 | HR 84 | Temp 97.5°F | Ht 69.0 in | Wt 230.0 lb

## 2019-10-16 DIAGNOSIS — Z79891 Long term (current) use of opiate analgesic: Secondary | ICD-10-CM

## 2019-10-16 DIAGNOSIS — M549 Dorsalgia, unspecified: Secondary | ICD-10-CM | POA: Insufficient documentation

## 2019-10-16 DIAGNOSIS — G894 Chronic pain syndrome: Secondary | ICD-10-CM | POA: Diagnosis not present

## 2019-10-16 DIAGNOSIS — Z5181 Encounter for therapeutic drug level monitoring: Secondary | ICD-10-CM

## 2019-10-16 DIAGNOSIS — G8921 Chronic pain due to trauma: Secondary | ICD-10-CM
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
## 2019-10-20 LAB — TOXASSURE SELECT,+ANTIDEPR,UR

## 2019-10-28 ENCOUNTER — Telehealth: Payer: Self-pay | Admitting: *Deleted

## 2019-10-28 NOTE — Telephone Encounter (Signed)
Urine drug screen for this encounter is consistent for prescribed medication 

## 2019-12-03 DIAGNOSIS — C674 Malignant neoplasm of posterior wall of bladder: Secondary | ICD-10-CM | POA: Diagnosis not present

## 2019-12-09 ENCOUNTER — Other Ambulatory Visit: Payer: Self-pay | Admitting: Urology

## 2019-12-09 ENCOUNTER — Encounter (HOSPITAL_BASED_OUTPATIENT_CLINIC_OR_DEPARTMENT_OTHER): Payer: Self-pay | Admitting: Urology

## 2019-12-09 ENCOUNTER — Other Ambulatory Visit: Payer: Self-pay

## 2019-12-09 NOTE — Progress Notes (Addendum)
Spoke w/ via phone for pre-op interview--- Pt and Pt's wife, Harold Morris Lab needs dos---- [employer] results------ current ekg in epic/ chart COVID test ------ fully vaccinated , no test needed Pt aware to bring vaccine card dos for verification. Arrive at ------- 0830 NPO after MN NO Solid Food.  Clear liquids from MN until--- 0730 then nothing by mouth this include no chew tobacco Medications to take morning of surgery ----- Allopurinol, Zoloft, Prilosec, Myrbetriq and if needed take tramadol w/ sips of water Diabetic medication ----- do not take diabetic medication morning of surgery Patient Special Instructions ----- n/a Pre-Op special Istructions ----- may need pt's wife in pre-op for medication, pt unsure of what he is taking Patient and patient wife verbalized understanding of instructions that were given at this phone interview. Patient denies usual shortness of breath, chest pain, fever, cough at this phone interview.   Anesthesia :  Hx lung cancer s/p LLLobectomy 02/ 2011 (pt stated no recurrence) completed chemo 2011 and no radiation;  Hx PE 08/ 2016 (admission in epic) completed xarelto 03/ 2017 now on ASA;  AAA last CT in epic 04-03-2019 (4.3cm);  Pt denies any cardiac s&s.  Pt had clearance from [place] in [place] with previous surgery @[facility]  08-10-2019 (this was not scanned in by medical records). Called office and received by fax the previous clearance , placed with chart, I put a note on it for medical records.   [facility]  [facility] [place] Cardiologist :  Dr Weyman Croon ([facility] in [place]) Chest x-ray : CT chest 04-03-2019 epic EKG : 04-03-2019 epic Echo : 01-02-2015 epic  Stress test:  Per previous cardiac clearance note pt had negative stress test 2019 and had ECHO mostly unremarkable Cardiac Cath :  no Activity level:  Gets a little sob with normal daily activities but recovers quickly  Sleep Study/ CPAP : YES/ NO Fasting Blood Sugar :  140-150      / Checks Blood Sugar -- times a day:    Checks in am qod Blood Thinner/ Instructions /Last Dose: NO ASA / Instructions/ Last Dose :  ASA 81mg /  Per pt told to stop ASA by [name] office, last dose 12-08-2019

## 2019-12-09 NOTE — Progress Notes (Addendum)
NEW Covid Policy July 3567  Surgery Day:   12-15-2019  Facility:  [facility]  Type of Surgery:  Cyst/ retrograde pyelogram/ bladder tumor resection  Have you had Covid vaccine? YES  Fully Covid Vaccinated:   1) 06-22-2019                                          2) 07-13-2019                                          Where?  [facility]                                           Type?  [employer]   In the past 14 days:        Have you had any symptoms? NO       Have you been tested covid positive? NO       Have you been in contact with someone covid positive? NO       Have you traveled internationally? NO        Is pt Immuno-compromised? NO

## 2019-12-11 ENCOUNTER — Other Ambulatory Visit (HOSPITAL_COMMUNITY)
Admission: RE | Admit: 2019-12-11 | Discharge: 2019-12-11 | Disposition: A | Discharge status: Home / Self Care | Payer: Medicare Other | Source: Ambulatory Visit | Attending: Urology | Admitting: Urology

## 2019-12-11 DIAGNOSIS — Z20822 Contact with and (suspected) exposure to covid-19: Secondary | ICD-10-CM | POA: Diagnosis not present

## 2019-12-11 DIAGNOSIS — Z01818 Encounter for other preprocedural examination: Secondary | ICD-10-CM | POA: Insufficient documentation

## 2019-12-12 LAB — SARS CORONAVIRUS 2 (TAT 6-24 HRS): SARS Coronavirus 2: NEGATIVE

## 2019-12-14 ENCOUNTER — Encounter: Payer: Self-pay | Admitting: Physical Medicine and Rehabilitation

## 2019-12-14 ENCOUNTER — Encounter
Payer: Medicare Other | Attending: Physical Medicine and Rehabilitation | Admitting: Physical Medicine and Rehabilitation

## 2019-12-14 ENCOUNTER — Other Ambulatory Visit: Payer: Self-pay

## 2019-12-14 VITALS — BP 117/78 | HR 78 | Temp 98.5°F | Resp 24 | Ht 69.0 in | Wt 230.4 lb

## 2019-12-14 DIAGNOSIS — Z79891 Long term (current) use of opiate analgesic: Secondary | ICD-10-CM | POA: Insufficient documentation

## 2019-12-14 DIAGNOSIS — G894 Chronic pain syndrome: Secondary | ICD-10-CM | POA: Diagnosis not present

## 2019-12-14 DIAGNOSIS — G8921 Chronic pain due to trauma: Secondary | ICD-10-CM | POA: Diagnosis not present

## 2019-12-14 DIAGNOSIS — S22000A Wedge compression fracture of unspecified thoracic vertebra, initial encounter for closed fracture: Secondary | ICD-10-CM

## 2019-12-14 DIAGNOSIS — M549 Dorsalgia, unspecified: Secondary | ICD-10-CM | POA: Diagnosis not present

## 2019-12-14 DIAGNOSIS — C679 Malignant neoplasm of bladder, unspecified: Secondary | ICD-10-CM | POA: Diagnosis not present

## 2019-12-14 DIAGNOSIS — Z5181 Encounter for therapeutic drug level monitoring: Secondary | ICD-10-CM | POA: Diagnosis not present

## 2019-12-14 NOTE — Patient Instructions (Signed)
1. Patient here for trigger point injections for  Consent done and on chart.  Cleaned areas with alcohol and injected using a 27 gauge 1.5 inch needle  Injected 6cc Using 1% Lidocaine with no EPI  Upper traps B/L Levators B/L Posterior scalenes B/L Middle scalenes Splenius Capitus B/L Pectoralis Major Rhomboids B/L x2 Infraspinatus Teres Major/minor Thoracic paraspinals B/L x2 Lumbar paraspinals B/Lx2 Other injections-    Patient's level of pain prior was Current level of pain after injections is  There was no bleeding or complications.  Patient was advised to drink a lot of water on day after injections to flush system Will have increased soreness for 12-48 hours after injections.  Can use Lidocaine patches the day AFTER injections Can use theracane on day of injections in places didn't inject Can use heating pad 4-6 hours AFTER injections  2. C/o RUQ pain - CNA thought was R chest pain- dizzy- didn't take vertigo meds per wife- gave water- vitals stable (RR- high since lower lobe resected)- L temple pain now- less dizziness.   3. New bladder cancer-  Has planned bladder tumor resected tomorrow- try to POOP!!! Before surgery
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
## 2019-12-15 ENCOUNTER — Encounter (HOSPITAL_BASED_OUTPATIENT_CLINIC_OR_DEPARTMENT_OTHER)
Admission: RE | Disposition: A | Discharge status: Home / Self Care | Payer: Self-pay | Source: Home / Self Care | Attending: Urology

## 2019-12-15 ENCOUNTER — Ambulatory Visit (HOSPITAL_BASED_OUTPATIENT_CLINIC_OR_DEPARTMENT_OTHER): Payer: Medicare Other | Admitting: Anesthesiology

## 2019-12-15 ENCOUNTER — Encounter (HOSPITAL_BASED_OUTPATIENT_CLINIC_OR_DEPARTMENT_OTHER): Payer: Self-pay | Admitting: Urology

## 2019-12-15 ENCOUNTER — Ambulatory Visit (HOSPITAL_BASED_OUTPATIENT_CLINIC_OR_DEPARTMENT_OTHER)
Admission: RE | Admit: 2019-12-15 | Discharge: 2019-12-15 | Disposition: A | Discharge status: Home / Self Care | Payer: Medicare Other | Attending: Urology | Admitting: Urology

## 2019-12-15 ENCOUNTER — Other Ambulatory Visit: Payer: Self-pay

## 2019-12-15 DIAGNOSIS — D494 Neoplasm of unspecified behavior of bladder: Secondary | ICD-10-CM | POA: Diagnosis not present

## 2019-12-15 DIAGNOSIS — G4730 Sleep apnea, unspecified: Secondary | ICD-10-CM | POA: Diagnosis not present

## 2019-12-15 DIAGNOSIS — C679 Malignant neoplasm of bladder, unspecified: Secondary | ICD-10-CM | POA: Diagnosis not present

## 2019-12-15 DIAGNOSIS — Z96652 Presence of left artificial knee joint: Secondary | ICD-10-CM | POA: Insufficient documentation

## 2019-12-15 DIAGNOSIS — Z79899 Other long term (current) drug therapy: Secondary | ICD-10-CM | POA: Diagnosis not present

## 2019-12-15 DIAGNOSIS — K219 Gastro-esophageal reflux disease without esophagitis: Secondary | ICD-10-CM | POA: Diagnosis not present

## 2019-12-15 DIAGNOSIS — Z85118 Personal history of other malignant neoplasm of bronchus and lung: Secondary | ICD-10-CM | POA: Diagnosis not present

## 2019-12-15 DIAGNOSIS — N323 Diverticulum of bladder: Secondary | ICD-10-CM | POA: Diagnosis not present

## 2019-12-15 DIAGNOSIS — N401 Enlarged prostate with lower urinary tract symptoms: Secondary | ICD-10-CM | POA: Insufficient documentation

## 2019-12-15 DIAGNOSIS — Z902 Acquired absence of lung [part of]: Secondary | ICD-10-CM | POA: Insufficient documentation

## 2019-12-15 DIAGNOSIS — Z87891 Personal history of nicotine dependence: Secondary | ICD-10-CM | POA: Insufficient documentation

## 2019-12-15 DIAGNOSIS — E119 Type 2 diabetes mellitus without complications: Secondary | ICD-10-CM | POA: Diagnosis not present

## 2019-12-15 DIAGNOSIS — M1990 Unspecified osteoarthritis, unspecified site: Secondary | ICD-10-CM | POA: Insufficient documentation

## 2019-12-15 HISTORY — DX: Personal history of other diseases of urinary system: Z87.448

## 2019-12-15 HISTORY — PX: TRANSURETHRAL RESECTION OF BLADDER TUMOR: SHX2575

## 2019-12-15 HISTORY — PX: CYSTOSCOPY W/ RETROGRADES: SHX1426

## 2019-12-15 HISTORY — DX: Malignant neoplasm of bladder, unspecified: C67.9

## 2019-12-15 HISTORY — DX: Unspecified symptoms and signs involving the genitourinary system: R39.9

## 2019-12-15 LAB — POCT I-STAT, CHEM 8
BUN: 18 mg/dL (ref 8–23)
Calcium, Ion: 1.28 mmol/L (ref 1.15–1.40)
Chloride: 102 mmol/L (ref 98–111)
Creatinine, Ser: 1 mg/dL (ref 0.61–1.24)
Glucose, Bld: 149 mg/dL — ABNORMAL HIGH (ref 70–99)
HCT: 43 % (ref 39.0–52.0)
Hemoglobin: 14.6 g/dL (ref 13.0–17.0)
Potassium: 3.6 mmol/L (ref 3.5–5.1)
Sodium: 142 mmol/L (ref 135–145)
TCO2: 26 mmol/L (ref 22–32)

## 2019-12-15 LAB — GLUCOSE, CAPILLARY: Glucose-Capillary: 128 mg/dL — ABNORMAL HIGH (ref 70–99)

## 2019-12-15 SURGERY — TURBT (TRANSURETHRAL RESECTION OF BLADDER TUMOR)
Anesthesia: General | Site: Ureter

## 2019-12-15 MED ORDER — PROPOFOL 500 MG/50ML IV EMUL
INTRAVENOUS | Status: AC
  Filled 2019-12-15: qty 100

## 2019-12-15 MED ORDER — TRAMADOL HCL 50 MG PO TABS
ORAL_TABLET | ORAL | Status: AC
  Filled 2019-12-15: qty 1

## 2019-12-15 MED ORDER — CEFAZOLIN SODIUM-DEXTROSE 2-4 GM/100ML-% IV SOLN
2.0000 g | INTRAVENOUS | Status: AC
  Administered 2019-12-15: 2 g via INTRAVENOUS

## 2019-12-15 MED ORDER — DEXAMETHASONE SODIUM PHOSPHATE 10 MG/ML IJ SOLN
INTRAMUSCULAR | Status: AC
  Filled 2019-12-15: qty 1

## 2019-12-15 MED ORDER — FENTANYL CITRATE (PF) 100 MCG/2ML IJ SOLN
25.0000 ug | INTRAMUSCULAR | Status: DC | PRN
  Administered 2019-12-15 (×2): 25 ug via INTRAVENOUS

## 2019-12-15 MED ORDER — SODIUM CHLORIDE 0.9 % IR SOLN
Status: DC | PRN
  Administered 2019-12-15: 4600 mL via INTRAVESICAL

## 2019-12-15 MED ORDER — ONDANSETRON HCL 4 MG/2ML IJ SOLN: 4.0000 mg | Freq: Once | INTRAMUSCULAR | Status: DC | PRN

## 2019-12-15 MED ORDER — FENTANYL CITRATE (PF) 100 MCG/2ML IJ SOLN
INTRAMUSCULAR | Status: DC | PRN
  Administered 2019-12-15 (×2): 50 ug via INTRAVENOUS

## 2019-12-15 MED ORDER — PROPOFOL 10 MG/ML IV BOLUS
INTRAVENOUS | Status: DC | PRN
  Administered 2019-12-15: 150 mg via INTRAVENOUS

## 2019-12-15 MED ORDER — CEFAZOLIN SODIUM-DEXTROSE 2-4 GM/100ML-% IV SOLN
INTRAVENOUS | Status: AC
  Filled 2019-12-15: qty 100

## 2019-12-15 MED ORDER — ONDANSETRON HCL 4 MG/2ML IJ SOLN
INTRAMUSCULAR | Status: AC
  Filled 2019-12-15: qty 2

## 2019-12-15 MED ORDER — ONDANSETRON HCL 4 MG/2ML IJ SOLN
INTRAMUSCULAR | Status: DC | PRN
  Administered 2019-12-15: 4 mg via INTRAVENOUS

## 2019-12-15 MED ORDER — PHENYLEPHRINE 40 MCG/ML (10ML) SYRINGE FOR IV PUSH (FOR BLOOD PRESSURE SUPPORT)
PREFILLED_SYRINGE | INTRAVENOUS | Status: DC | PRN
  Administered 2019-12-15: 80 ug via INTRAVENOUS
  Administered 2019-12-15: 120 ug via INTRAVENOUS
  Administered 2019-12-15: 80 ug via INTRAVENOUS

## 2019-12-15 MED ORDER — IOHEXOL 300 MG/ML  SOLN
INTRAMUSCULAR | Status: DC | PRN
  Administered 2019-12-15: 10 mL via URETHRAL

## 2019-12-15 MED ORDER — LIDOCAINE 2% (20 MG/ML) 5 ML SYRINGE
INTRAMUSCULAR | Status: AC
  Filled 2019-12-15: qty 5

## 2019-12-15 MED ORDER — ACETAMINOPHEN 10 MG/ML IV SOLN: 1000.0000 mg | Freq: Once | INTRAVENOUS | Status: DC | PRN

## 2019-12-15 MED ORDER — LACTATED RINGERS IV SOLN: INTRAVENOUS | Status: DC

## 2019-12-15 MED ORDER — FENTANYL CITRATE (PF) 100 MCG/2ML IJ SOLN
INTRAMUSCULAR | Status: AC
  Filled 2019-12-15: qty 2

## 2019-12-15 MED ORDER — LIDOCAINE 2% (20 MG/ML) 5 ML SYRINGE
INTRAMUSCULAR | Status: DC | PRN
  Administered 2019-12-15: 100 mg via INTRAVENOUS

## 2019-12-15 MED ORDER — TRAMADOL HCL 50 MG PO TABS
50.0000 mg | ORAL_TABLET | Freq: Once | ORAL | Status: AC | PRN
  Administered 2019-12-15: 50 mg via ORAL

## 2019-12-15 MED ORDER — DEXAMETHASONE SODIUM PHOSPHATE 4 MG/ML IJ SOLN
INTRAMUSCULAR | Status: DC | PRN
  Administered 2019-12-15: 5 mg via INTRAVENOUS

## 2019-12-15 SURGICAL SUPPLY — 47 items
BAG DRAIN URO-CYSTO SKYTR STRL (DRAIN) ×2 IMPLANT
BAG DRN RND TRDRP ANRFLXCHMBR (UROLOGICAL SUPPLIES)
BAG DRN UROCATH (DRAIN)
BAG URINE DRAIN 2000ML AR STRL (UROLOGICAL SUPPLIES) IMPLANT
BAG URINE LEG 500ML (DRAIN) IMPLANT
BASKET LASER NITINOL 1.9FR (BASKET) IMPLANT
BASKET STNLS GEMINI 4WIRE 3FR (BASKET) IMPLANT
BASKET ZERO TIP NITINOL 2.4FR (BASKET) IMPLANT
BSKT STON RTRVL 120 1.9FR (BASKET)
BSKT STON RTRVL GEM 120X11 3FR (BASKET)
BSKT STON RTRVL ZERO TP 2.4FR (BASKET)
CATH FOLEY 2WAY SLVR  5CC 20FR (CATHETERS)
CATH FOLEY 2WAY SLVR  5CC 22FR (CATHETERS)
CATH FOLEY 2WAY SLVR 5CC 20FR (CATHETERS) IMPLANT
CATH FOLEY 2WAY SLVR 5CC 22FR (CATHETERS) IMPLANT
CATH INTERMIT  6FR 70CM (CATHETERS) IMPLANT
CATH URET 5FR 28IN CONE TIP (BALLOONS)
CATH URET 5FR 28IN OPEN ENDED (CATHETERS) ×2 IMPLANT
CATH URET 5FR 70CM CONE TIP (BALLOONS) IMPLANT
CLOTH BEACON ORANGE TIMEOUT ST (SAFETY) ×4 IMPLANT
ELECT BUTTON BIOP 24F 90D PLAS (MISCELLANEOUS) IMPLANT
ELECT REM PT RETURN 9FT ADLT (ELECTROSURGICAL) ×4
ELECTRODE REM PT RTRN 9FT ADLT (ELECTROSURGICAL) ×2 IMPLANT
EVACUATOR MICROVAS BLADDER (UROLOGICAL SUPPLIES) IMPLANT
FIBER LASER FLEXIVA 365 (UROLOGICAL SUPPLIES) IMPLANT
FIBER LASER TRAC TIP (UROLOGICAL SUPPLIES) IMPLANT
GLOVE BIO SURGEON STRL SZ7.5 (GLOVE) ×4 IMPLANT
GOWN STRL REUS W/ TWL XL LVL3 (GOWN DISPOSABLE) ×2 IMPLANT
GOWN STRL REUS W/TWL XL LVL3 (GOWN DISPOSABLE) ×4
GUIDEWIRE ANG ZIPWIRE 038X150 (WIRE) IMPLANT
GUIDEWIRE STR DUAL SENSOR (WIRE) IMPLANT
HOLDER FOLEY CATH W/STRAP (MISCELLANEOUS) IMPLANT
IV NS IRRIG 3000ML ARTHROMATIC (IV SOLUTION) ×4 IMPLANT
KIT BALLIN UROMAX 15FX10 (LABEL) IMPLANT
KIT BALLN UROMAX 15FX4 (MISCELLANEOUS) IMPLANT
KIT BALLN UROMAX 26 75X4 (MISCELLANEOUS)
KIT TURNOVER CYSTO (KITS) ×4 IMPLANT
MANIFOLD NEPTUNE II (INSTRUMENTS) IMPLANT
PACK CYSTO (CUSTOM PROCEDURE TRAY) ×4 IMPLANT
PLUG CATH AND CAP STER (CATHETERS) ×2 IMPLANT
SET HIGH PRES BAL DIL (LABEL)
SYR 30ML LL (SYRINGE) ×2 IMPLANT
SYR TOOMEY IRRIG 70ML (MISCELLANEOUS)
SYRINGE TOOMEY IRRIG 70ML (MISCELLANEOUS) IMPLANT
TUBE CONNECTING 12'X1/4 (SUCTIONS)
TUBE CONNECTING 12X1/4 (SUCTIONS) IMPLANT
WATER STERILE IRR 500ML POUR (IV SOLUTION) ×2 IMPLANT

## 2019-12-15 NOTE — Interval H&P Note (Signed)
History and Physical Interval Note:  12/15/2019 8:24 AM  Harold Morris  has presented today for surgery, with the diagnosis of RECURRENT BLADDER CANCER.  The various methods of treatment have been discussed with the patient and family. After consideration of risks, benefits and other options for treatment, the patient has consented to  Procedure(s) with comments: TRANSURETHRAL RESECTION OF BLADDER TUMOR (TURBT) (N/A) - 30 MINS CYSTOSCOPY WITH RETROGRADE PYELOGRAM (Bilateral) as a surgical intervention.  The patient's history has been reviewed, patient examined, no change in status, stable for surgery.  I have reviewed the patient's chart and labs.  Questions were answered to the patient's satisfaction.     Remi Haggard

## 2019-12-15 NOTE — Anesthesia Procedure Notes (Signed)
Procedure Name: LMA Insertion Date/Time: 12/15/2019 10:49 AM Performed by: Lieutenant Diego, CRNA Pre-anesthesia Checklist: Patient identified, Emergency Drugs available, Suction available and Patient being monitored Patient Re-evaluated:Patient Re-evaluated prior to induction Oxygen Delivery Method: Circle system utilized Preoxygenation: Pre-oxygenation with 100% oxygen Induction Type: IV induction Ventilation: Mask ventilation without difficulty LMA: LMA inserted LMA Size: 5.0 Number of attempts: 1 Placement Confirmation: positive ETCO2 and breath sounds checked- equal and bilateral Tube secured with: Tape Dental Injury: Teeth and Oropharynx as per pre-operative assessment

## 2019-12-15 NOTE — Discharge Instructions (Signed)
Foley catheter to gravity drain, instruct patient on routine Foley care.   Post Anesthesia Home Care Instructions  Activity: Get plenty of rest for the remainder of the day. A responsible adult should stay with you for 24 hours following the procedure.  For the next 24 hours, DO NOT: -Drive a car -Paediatric nurse -Drink alcoholic beverages -Take any medication unless instructed by your physician -Make any legal decisions or sign important papers.  Meals: Start with liquid foods such as gelatin or soup. Progress to regular foods as tolerated. Avoid greasy, spicy, heavy foods. If nausea and/or vomiting occur, drink only clear liquids until the nausea and/or vomiting subsides. Call your physician if vomiting continues.  Special Instructions/Symptoms: Your throat may feel dry or sore from the anesthesia or the breathing tube placed in your throat during surgery. If this causes discomfort, gargle with warm salt water. The discomfort should disappear within 24 hours.  If you had a scopolamine patch placed behind your ear for the management of post- operative nausea and/or vomiting:  1. The medication in the patch is effective for 72 hours, after which it should be removed.  Wrap patch in a tissue and discard in the trash. Wash hands thoroughly with soap and water. 2. You may remove the patch earlier than 72 hours if you experience unpleasant side effects which may include dry mouth, dizziness or visual disturbances. 3. Avoid touching the patch. Wash your hands with soap and water after contact with the patch.

## 2019-12-15 NOTE — Op Note (Signed)
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  66 French Foley placed at termination of the case.  Bilateral retrogrades were normal.  Operative note: After obtaining informed consent for the patient was taken the major cystoscopy suite placed under general anesthesia.  Placed in the dorsolithotomy position genitalia prepped draped in usual sterile fashion.  Proper pause and timeout was performed.  The urethra was subsequently dilated with Leander Rams sounds up to 28 [place].  The 23 French cystoscope was advanced into the bladder without difficulty.  Minimal bilobar prostatic hypertrophy noted.  Bladder was inspected with 3070 degree lenses revealed no bladder tumors.  There is a right-sided bladder diverticulum with a open bladder neck.  Several papillary tumors noted within the superior and inferior wall of the bladder diverticulum.  It was felt the best way management would be utilizing rigid biopsy forceps to get representative sample and then fulguration of the tumor.  Rigid biopsy forceps were used to remove several areas of the bladder tumor.  The cystoscope was then removed and the 26 French continuous-flow resectoscope was placed utilizing the obturator without difficulty.  Utilizing the rollerball loop the base of the removed tumor area was fulgurated with good hemostasis noted.  There is one area noted to have some perivesical fat.  The remainder of the tumor was fulgurated and scraped  away from the interior of the bladder diverticulum so no remaining tumor was visible.  Good hemostasis was noted.  Bilateral retrograde pyelograms were then performed with a 5 French open tip catheter revealed normal filling of both upper tracts without evidence of filling defect or hydronephrosis both upper tracts emptied out promptly upon removal of the retrograde catheter.  .  In order to allow healing of the resected area was felt that a Foley catheter will be placed.  81 French three-way Foley was placed to drain the bladder but the irrigation port was capped with plug.  The effluent was clear at termination of the case.  Procedure was terminated he was awakened from anesthesia and taken back to the recovery room in stable condition.  No immediate complication from the procedure.

## 2019-12-15 NOTE — Transfer of Care (Signed)
Immediate Anesthesia Transfer of Care Note  Patient: Harold Morris  Procedure(s) Performed: TRANSURETHRAL RESECTION OF BLADDER TUMOR (TURBT) (N/A Bladder) CYSTOSCOPY WITH RETROGRADE PYELOGRAM (Bilateral Ureter)  Patient Location: PACU  Anesthesia Type:General  Level of Consciousness: awake  Airway & Oxygen Therapy: Patient Spontanous Breathing and Patient connected to face mask oxygen  Post-op Assessment: Report given to RN and Post -op Vital signs reviewed and stable  Post vital signs: Reviewed and stable  Last Vitals:  Vitals Value Taken Time  BP    Temp 36.3 C 12/15/19 1143  Pulse 63 12/15/19 1144  Resp 13 12/15/19 1144  SpO2 100 % 12/15/19 1144  Vitals shown include unvalidated device data.  Last Pain:  Vitals:   12/15/19 0915  TempSrc: Oral  PainSc: 3       Patients Stated Pain Goal: 3 (48/88/91 6945)  Complications: No complications documented.

## 2019-12-15 NOTE — Anesthesia Postprocedure Evaluation (Signed)
Anesthesia Post Note  Patient: Harold Morris  Procedure(s) Performed: TRANSURETHRAL RESECTION OF BLADDER TUMOR (TURBT) (N/A Bladder) CYSTOSCOPY WITH RETROGRADE PYELOGRAM (Bilateral Ureter)     Patient location during evaluation: PACU Anesthesia Type: General Level of consciousness: awake and alert Pain management: pain level controlled Vital Signs Assessment: post-procedure vital signs reviewed and stable Respiratory status: spontaneous breathing, nonlabored ventilation, respiratory function stable and patient connected to nasal cannula oxygen Cardiovascular status: blood pressure returned to baseline and stable Postop Assessment: no apparent nausea or vomiting Anesthetic complications: no   No complications documented.  Last Vitals:  Vitals:   12/15/19 1230 12/15/19 1340  BP: (!) 156/97 (!) 150/84  Pulse: 69 67  Resp: 14 14  Temp:  (!) 36.3 C  SpO2: 94% 97%    Last Pain:  Vitals:   12/15/19 1340  TempSrc: Oral  PainSc:                  [name] DAVID

## 2019-12-16 ENCOUNTER — Encounter (HOSPITAL_BASED_OUTPATIENT_CLINIC_OR_DEPARTMENT_OTHER): Payer: Self-pay | Admitting: Urology

## 2019-12-16 LAB — SURGICAL PATHOLOGY

## 2019-12-18 DIAGNOSIS — C674 Malignant neoplasm of posterior wall of bladder: Secondary | ICD-10-CM | POA: Diagnosis not present

## 2019-12-24 DIAGNOSIS — R8279 Other abnormal findings on microbiological examination of urine: Secondary | ICD-10-CM | POA: Diagnosis not present

## 2019-12-24 DIAGNOSIS — D414 Neoplasm of uncertain behavior of bladder: Secondary | ICD-10-CM | POA: Diagnosis not present

## 2019-12-24 DIAGNOSIS — R300 Dysuria: Secondary | ICD-10-CM | POA: Diagnosis not present

## 2020-01-11 ENCOUNTER — Ambulatory Visit: Payer: Medicare Other | Admitting: Physical Medicine and Rehabilitation

## 2020-01-11 DIAGNOSIS — C674 Malignant neoplasm of posterior wall of bladder: Secondary | ICD-10-CM | POA: Diagnosis not present

## 2020-01-18 ENCOUNTER — Other Ambulatory Visit: Payer: Self-pay

## 2020-01-18 ENCOUNTER — Encounter: Payer: Self-pay | Admitting: Physical Medicine and Rehabilitation

## 2020-01-18 ENCOUNTER — Encounter
Payer: Medicare Other | Attending: Physical Medicine and Rehabilitation | Admitting: Physical Medicine and Rehabilitation

## 2020-01-18 VITALS — BP 115/79 | HR 87 | Temp 98.5°F | Ht 69.5 in | Wt 230.0 lb

## 2020-01-18 DIAGNOSIS — G894 Chronic pain syndrome: Secondary | ICD-10-CM | POA: Diagnosis not present

## 2020-01-18 DIAGNOSIS — M549 Dorsalgia, unspecified: Secondary | ICD-10-CM | POA: Insufficient documentation

## 2020-01-18 DIAGNOSIS — Z79891 Long term (current) use of opiate analgesic: Secondary | ICD-10-CM | POA: Insufficient documentation

## 2020-01-18 DIAGNOSIS — C679 Malignant neoplasm of bladder, unspecified: Secondary | ICD-10-CM

## 2020-01-18 DIAGNOSIS — G8921 Chronic pain due to trauma: Secondary | ICD-10-CM | POA: Diagnosis not present

## 2020-01-18 DIAGNOSIS — Z5181 Encounter for therapeutic drug level monitoring: Secondary | ICD-10-CM | POA: Insufficient documentation

## 2020-01-18 DIAGNOSIS — S22000A Wedge compression fracture of unspecified thoracic vertebra, initial encounter for closed fracture: Secondary | ICD-10-CM

## 2020-01-18 NOTE — Patient Instructions (Signed)
Plan: 1. Patient here for trigger point injections for  Consent done and on chart.  Cleaned areas with alcohol and injected using a 27 gauge 1.5 inch needle  Injected 6cc Using 1% Lidocaine with no EPI  Upper traps B/L Levators B/L Posterior scalenes Middle scalenes Splenius Capitus B/L Pectoralis Major Rhomboids B/l x2 Infraspinatus Teres Major/minor Thoracic paraspinals  B/L x2 Lumbar paraspinals B/l x3 Other injections-    Patient's level of pain prior was- 2/10 Current level of pain after injections is 3/10- due to injections.   There was no bleeding or complications.  Patient was advised to drink a lot of water on day after injections to flush system Will have increased soreness for 12-48 hours after injections.  Can use Lidocaine patches the day AFTER injections Can use theracane on day of injections in places didn't inject Can use heating pad 4-6 hours AFTER injections  2. F/U in 6 or so weeks.  After chemo finishes- 9/28

## 2020-01-18 NOTE — Progress Notes (Signed)
  Found to have bladder cancer- getting resection done With urethral resection being done.    Tomorrow to start Chemo wash- has 6 weeks of that. Uses a temporary catheter and put in bladder with catheter- Stage III  To get month off after chemo wash and then do cystoscopy then they will look again.    Trying to keep him home, not around people.  Low back hurting some.  Tramadol- taking- not every day- 1-2 tabs max in a day.    Plan: 1. Patient here for trigger point injections for  Consent done and on chart.  Cleaned areas with alcohol and injected using a 27 gauge 1.5 inch needle  Injected 6cc Using 1% Lidocaine with no EPI  Upper traps B/L Levators B/L Posterior scalenes Middle scalenes Splenius Capitus B/L Pectoralis Major Rhomboids B/l x2 Infraspinatus Teres Major/minor Thoracic paraspinals  B/L x2 Lumbar paraspinals B/l x3 Other injections-    Patient's level of pain prior was- 2/10 Current level of pain after injections is 3/10- due to injections.   There was no bleeding or complications.  Patient was advised to drink a lot of water on day after injections to flush system Will have increased soreness for 12-48 hours after injections.  Can use Lidocaine patches the day AFTER injections Can use theracane on day of injections in places didn't inject Can use heating pad 4-6 hours AFTER injections  2. F/U in 6 or so weeks.  After chemo finishes- 9/28

## 2020-01-19 DIAGNOSIS — C674 Malignant neoplasm of posterior wall of bladder: Secondary | ICD-10-CM | POA: Diagnosis not present

## 2020-01-19 DIAGNOSIS — Z5111 Encounter for antineoplastic chemotherapy: Secondary | ICD-10-CM | POA: Diagnosis not present

## 2020-01-26 DIAGNOSIS — Z5111 Encounter for antineoplastic chemotherapy: Secondary | ICD-10-CM | POA: Diagnosis not present

## 2020-01-26 DIAGNOSIS — C674 Malignant neoplasm of posterior wall of bladder: Secondary | ICD-10-CM | POA: Diagnosis not present

## 2020-02-02 DIAGNOSIS — Z5111 Encounter for antineoplastic chemotherapy: Secondary | ICD-10-CM | POA: Diagnosis not present

## 2020-02-02 DIAGNOSIS — C674 Malignant neoplasm of posterior wall of bladder: Secondary | ICD-10-CM | POA: Diagnosis not present

## 2020-02-09 DIAGNOSIS — C674 Malignant neoplasm of posterior wall of bladder: Secondary | ICD-10-CM | POA: Diagnosis not present

## 2020-02-09 DIAGNOSIS — Z5111 Encounter for antineoplastic chemotherapy: Secondary | ICD-10-CM | POA: Diagnosis not present

## 2020-02-16 DIAGNOSIS — C674 Malignant neoplasm of posterior wall of bladder: Secondary | ICD-10-CM | POA: Diagnosis not present

## 2020-02-16 DIAGNOSIS — Z5111 Encounter for antineoplastic chemotherapy: Secondary | ICD-10-CM | POA: Diagnosis not present

## 2020-02-23 DIAGNOSIS — R300 Dysuria: Secondary | ICD-10-CM | POA: Diagnosis not present

## 2020-03-02 ENCOUNTER — Encounter: Payer: Medicare Other | Admitting: Physical Medicine and Rehabilitation

## 2020-03-02 DIAGNOSIS — C674 Malignant neoplasm of posterior wall of bladder: Secondary | ICD-10-CM | POA: Diagnosis not present

## 2020-03-02 DIAGNOSIS — Z5111 Encounter for antineoplastic chemotherapy: Secondary | ICD-10-CM | POA: Diagnosis not present

## 2020-03-28 ENCOUNTER — Other Ambulatory Visit: Payer: Self-pay

## 2020-03-28 ENCOUNTER — Encounter: Payer: Self-pay | Admitting: Physical Medicine and Rehabilitation

## 2020-03-28 ENCOUNTER — Encounter
Payer: Medicare Other | Attending: Physical Medicine and Rehabilitation | Admitting: Physical Medicine and Rehabilitation

## 2020-03-28 VITALS — BP 123/84 | HR 82 | Temp 98.1°F | Ht 69.0 in | Wt 235.0 lb

## 2020-03-28 DIAGNOSIS — M7918 Myalgia, other site: Secondary | ICD-10-CM

## 2020-03-28 DIAGNOSIS — G8921 Chronic pain due to trauma: Secondary | ICD-10-CM | POA: Diagnosis not present

## 2020-03-28 DIAGNOSIS — C679 Malignant neoplasm of bladder, unspecified: Secondary | ICD-10-CM | POA: Diagnosis not present

## 2020-03-28 NOTE — Progress Notes (Signed)
  Pt is a 69 yr old male with hx of 2 Thoracic compression fractures as well as bladder cancer in treatment- here for trigger point injections-    Done with chemo wash Going back for camera next Wednesday to see how it looks.   Back pain is worse lately.  Not taking much tramadol- has 85 pills left- mainly takes at night.  So max 1 tab every 2 days.  That he's taking- and  Been hurting a lot - last seen 2.5 months ago.   Got Zio patch- has had x 1 week so far.  Has an Aortic Aneurysm- just monitoring.     Plan: 1. Suggest taking Tramadol as prescribed- which is to say that take it every 6 hours IF pain >5/10 on pain scale. If doesn't hurt that much, then take Ibuprofen.   2. Doesn't need refill of Tramadol- has 85 pills. .    3. Patient here for trigger point injections for  Consent done and on chart.  Cleaned areas with alcohol and injected using a 27 gauge 1.5 inch needle  Injected  8cc Using 1% Lidocaine with no EPI  Upper traps B/L Levators B/L Posterior scalenes B/L Middle scalenes Splenius Capitus B/L Pectoralis Major not today Rhomboids B/L x2 Infraspinatus Teres Major/minor Thoracic paraspinals B/L x2 Lumbar paraspinals B/L x3 Other injections-    Patient's level of pain prior was 9/10 Current level of pain after injections is 8/10  There was no bleeding or complications.  Patient was advised to drink a lot of water on day after injections to flush system Will have increased soreness for 12-48 hours after injections.  Can use Lidocaine patches the day AFTER injections Can use theracane on day  After injections Can use heating pad 4-6 hours AFTER injections   4. I am NOT concerned that you will be an addict.   5. F/U in 6 weeks.    I spent a total of 25 minutes on visit- as detailed abov.e

## 2020-03-28 NOTE — Patient Instructions (Signed)
Plan: 1. Suggest taking Tramadol as prescribed- which is to say that take it every 6 hours IF pain >5/10 on pain scale. If doesn't hurt that much, then take Ibuprofen.   2. Doesn't need refill of Tramadol- has 85 pills. .    3. Patient here for trigger point injections for  Consent done and on chart.  Cleaned areas with alcohol and injected using a 27 gauge 1.5 inch needle  Injected  8cc Using 1% Lidocaine with no EPI  Upper traps B/L Levators B/L Posterior scalenes B/L Middle scalenes Splenius Capitus B/L Pectoralis Major not today Rhomboids B/L x2 Infraspinatus Teres Major/minor Thoracic paraspinals B/L x2 Lumbar paraspinals B/L x3 Other injections-    Patient's level of pain prior was 9/10 Current level of pain after injections is 8/10  There was no bleeding or complications.  Patient was advised to drink a lot of water on day after injections to flush system Will have increased soreness for 12-48 hours after injections.  Can use Lidocaine patches the day AFTER injections Can use theracane on day  After injections Can use heating pad 4-6 hours AFTER injections   4. I am NOT concerned that you will be an addict.   5. F/U in 6 weeks.

## 2020-04-06 DIAGNOSIS — C674 Malignant neoplasm of posterior wall of bladder: Secondary | ICD-10-CM | POA: Diagnosis not present

## 2020-04-06 DIAGNOSIS — R109 Unspecified abdominal pain: Secondary | ICD-10-CM | POA: Diagnosis not present

## 2020-04-19 DIAGNOSIS — R109 Unspecified abdominal pain: Secondary | ICD-10-CM | POA: Diagnosis not present

## 2020-04-19 DIAGNOSIS — I714 Abdominal aortic aneurysm, without rupture: Secondary | ICD-10-CM | POA: Diagnosis not present

## 2020-04-19 DIAGNOSIS — C674 Malignant neoplasm of posterior wall of bladder: Secondary | ICD-10-CM | POA: Diagnosis not present

## 2020-04-19 DIAGNOSIS — H2513 Age-related nuclear cataract, bilateral: Secondary | ICD-10-CM | POA: Diagnosis not present

## 2020-04-19 DIAGNOSIS — H40033 Anatomical narrow angle, bilateral: Secondary | ICD-10-CM | POA: Diagnosis not present

## 2020-05-09 ENCOUNTER — Encounter: Payer: Self-pay | Admitting: Physical Medicine and Rehabilitation

## 2020-05-09 ENCOUNTER — Encounter
Payer: Medicare Other | Attending: Physical Medicine and Rehabilitation | Admitting: Physical Medicine and Rehabilitation

## 2020-05-09 ENCOUNTER — Other Ambulatory Visit: Payer: Self-pay

## 2020-05-09 VITALS — BP 121/81 | HR 83 | Temp 98.1°F | Ht 69.0 in | Wt 237.0 lb

## 2020-05-09 DIAGNOSIS — M533 Sacrococcygeal disorders, not elsewhere classified: Secondary | ICD-10-CM | POA: Insufficient documentation

## 2020-05-09 DIAGNOSIS — G8921 Chronic pain due to trauma: Secondary | ICD-10-CM | POA: Diagnosis not present

## 2020-05-09 DIAGNOSIS — M7918 Myalgia, other site: Secondary | ICD-10-CM | POA: Diagnosis not present

## 2020-05-09 DIAGNOSIS — S22000A Wedge compression fracture of unspecified thoracic vertebra, initial encounter for closed fracture: Secondary | ICD-10-CM | POA: Insufficient documentation

## 2020-05-09 DIAGNOSIS — G8929 Other chronic pain: Secondary | ICD-10-CM | POA: Diagnosis not present

## 2020-05-09 NOTE — Patient Instructions (Signed)
1. Place TENS unit on TENS- not Electrical stimulation.  Can be contraindicated on Estim.    2. Take 1/2 tab of Tramadol- during day. And continue to take at night.   3. Has 2 refills left on tramadol- so doesn't need refill.  4. Patient here for trigger point injections for  Consent done and on chart.  Cleaned areas with alcohol and injected using a 27 gauge 1.5 inch needle  Injected 6cc Using 1% Lidocaine with no EPI  Upper traps B/L Levators B/L Posterior scalenes B/L x2 Middle scalenes Splenius Capitus B/L Pectoralis Major Rhomboids B/L Infraspinatus Teres Major/minor Thoracic paraspinals Lumbar paraspinals B/L x2 Other injections-    Patient's level of pain prior was 7-8/10 Current level of pain after injections is- 7-8/10  There was no bleeding or complications.  Patient was advised to drink a lot of water on day after injections to flush system Will have increased soreness for 12-48 hours after injections.  Can use Lidocaine patches the day AFTER injections Can use theracane on day of injections in places didn't inject Can use heating pad 4-6 hours AFTER injections    5. F/U in 6 weeks.  Trigger point injections.
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
## 2020-06-03 ENCOUNTER — Other Ambulatory Visit: Payer: Self-pay | Admitting: *Deleted

## 2020-06-03 MED ORDER — TRAMADOL HCL 50 MG PO TABS: 50.0000 mg | ORAL_TABLET | Freq: Four times a day (QID) | ORAL | 5 refills | Status: DC | PRN

## 2020-06-03 NOTE — Telephone Encounter (Signed)
Incoming fax from [facility] requesting refills on tramadol.

## 2020-06-10 DIAGNOSIS — R1033 Periumbilical pain: Secondary | ICD-10-CM | POA: Diagnosis not present

## 2020-06-10 DIAGNOSIS — C674 Malignant neoplasm of posterior wall of bladder: Secondary | ICD-10-CM | POA: Diagnosis not present

## 2020-06-10 DIAGNOSIS — R1031 Right lower quadrant pain: Secondary | ICD-10-CM | POA: Diagnosis not present

## 2020-06-15 ENCOUNTER — Encounter: Payer: Self-pay | Admitting: Physician Assistant

## 2020-06-24 ENCOUNTER — Encounter
Payer: Medicare Other | Attending: Physical Medicine and Rehabilitation | Admitting: Physical Medicine and Rehabilitation

## 2020-06-24 ENCOUNTER — Encounter: Payer: Self-pay | Admitting: Physical Medicine and Rehabilitation

## 2020-06-24 ENCOUNTER — Other Ambulatory Visit: Payer: Self-pay

## 2020-06-24 VITALS — BP 124/84 | HR 86 | Temp 97.8°F | Ht 69.0 in | Wt 235.6 lb

## 2020-06-24 DIAGNOSIS — S22000A Wedge compression fracture of unspecified thoracic vertebra, initial encounter for closed fracture: Secondary | ICD-10-CM | POA: Insufficient documentation

## 2020-06-24 DIAGNOSIS — G894 Chronic pain syndrome: Secondary | ICD-10-CM | POA: Diagnosis not present

## 2020-06-24 DIAGNOSIS — G8921 Chronic pain due to trauma: Secondary | ICD-10-CM | POA: Diagnosis not present

## 2020-06-24 DIAGNOSIS — M7918 Myalgia, other site: Secondary | ICD-10-CM | POA: Diagnosis not present

## 2020-06-24 DIAGNOSIS — Z5181 Encounter for therapeutic drug level monitoring: Secondary | ICD-10-CM | POA: Diagnosis not present

## 2020-06-24 DIAGNOSIS — Z79891 Long term (current) use of opiate analgesic: Secondary | ICD-10-CM | POA: Insufficient documentation

## 2020-06-24 NOTE — Patient Instructions (Signed)
Plan: 1. Suggest checking BP whenever he feels a little lightheaded- if low- <100 upper number which is systolic BP- call me and we can start Midodrine to keep his BP up better.   2. Just got new tramadol Rx on 06/03/20- has 5 refills. Taking better- con't taking at least 2x/day.   3. Patient here for trigger point injections for  Consent done and on chart.  Cleaned areas with alcohol and injected using a 27 gauge 1.5 inch needle  Injected 7.5cc Using 1% Lidocaine with no EPI  Upper traps B/L Levators B/L Posterior scalenes B/L x2- also for vertigo- post auricular Middle scalenes Splenius Capitus B/L Pectoralis Major B/L Rhomboids B/L x2 Infraspinatus Teres Major/minor Thoracic paraspinals B/L x2 Lumbar paraspinals B/L x2 Other injections-    Patient's level of pain prior was 4/10- and also felt very dizzy/lightheaded Current level of pain after injections is lightheadedness/dizzy resolved from postauricular injections- pain much improved- "gone" except from needling itself.   There was no bleeding or complications.  Patient was advised to drink a lot of water on day after injections to flush system Will have increased soreness for 12-48 hours after injections.  Can use Lidocaine patches the day AFTER injections Can use theracane on day of injections in places didn't inject Can use heating pad 4-6 hours AFTER injections  4. Vertigo can be from post auricular trigger point injections. Is better- but monitor Sx's closely.   5. F/U in 6 weeks.

## 2020-06-24 NOTE — Progress Notes (Signed)
Pt is a 70 yr old male with hx of 2 Thoracic compression fractures as well as bladder cancer in treatment- here for trigger point injections-    Is sleepy this AM.   "getting lazy" as "reason" for being sleepy.   Now taking tramadol 1 pill in Am and 1 at night.  Helps pain a little more. Marked pain 3-4/10 on pain scale which is better than normal.    Fell down with leg giving way this AM- both legs gave way- tried to get up- wasn't able to get up.  Was in parking lot this AM at [place] office- didn't feel any pain at all.   Doesn't feel like hit anything on way down to ground.  Required a man in parking lot to get him off ground.   Just lightheaded now. BP 124/84 right now.   BP was running a little high last week- 140/100 or so per wife- went to ER- seeing GI next Tuesday for this pain.  Not having the same pain anymore.  Hasn't been running low per wife that she's aware of.   Just a few episodes lately feeling lightheaded.     Plan: 1. Suggest checking BP whenever he feels a little lightheaded- if low- <100 upper number which is systolic BP- call me and we can start Midodrine to keep his BP up better.   2. Just got new tramadol Rx on 06/03/20- has 5 refills. Taking better- con't taking at least 2x/day.   3. Patient here for trigger point injections for  Consent done and on chart.  Cleaned areas with alcohol and injected using a 27 gauge 1.5 inch needle  Injected 7.5cc Using 1% Lidocaine with no EPI  Upper traps B/L Levators B/L Posterior scalenes B/L x2- also for vertigo- post auricular Middle scalenes Splenius Capitus B/L Pectoralis Major B/L Rhomboids B/L x2 Infraspinatus Teres Major/minor Thoracic paraspinals B/L x2 Lumbar paraspinals B/L x2 Other injections-    Patient's level of pain prior was 4/10- and also felt very dizzy/lightheaded Current level of pain after injections is lightheadedness/dizzy resolved from postauricular injections- pain much  improved- "gone" except from needling itself.   There was no bleeding or complications.  Patient was advised to drink a lot of water on day after injections to flush system Will have increased soreness for 12-48 hours after injections.  Can use Lidocaine patches the day AFTER injections Can use theracane on day of injections in places didn't inject Can use heating pad 4-6 hours AFTER injections  4. Vertigo can be from post auricular trigger point injections. Is better- but monitor Sx's closely.   5. Urine drug screen today per hospital policy   5. F/U in 6 weeks.   I spent a total on 25  minutes on visit- as detailed above.

## 2020-06-28 ENCOUNTER — Ambulatory Visit: Payer: Medicare Other | Admitting: Physician Assistant

## 2020-06-28 ENCOUNTER — Encounter: Payer: Self-pay | Admitting: Physician Assistant

## 2020-06-28 ENCOUNTER — Other Ambulatory Visit: Payer: Self-pay

## 2020-06-28 VITALS — BP 120/80 | HR 82 | Ht 69.0 in | Wt 235.0 lb

## 2020-06-28 DIAGNOSIS — R102 Pelvic and perineal pain: Secondary | ICD-10-CM | POA: Diagnosis not present

## 2020-06-28 DIAGNOSIS — Z86010 Personal history of colonic polyps: Secondary | ICD-10-CM

## 2020-06-28 DIAGNOSIS — K529 Noninfective gastroenteritis and colitis, unspecified: Secondary | ICD-10-CM

## 2020-06-28 DIAGNOSIS — G8929 Other chronic pain: Secondary | ICD-10-CM | POA: Diagnosis not present

## 2020-06-28 DIAGNOSIS — R109 Unspecified abdominal pain: Secondary | ICD-10-CM

## 2020-06-28 MED ORDER — GLYCOPYRROLATE 2 MG PO TABS: 2.0000 mg | ORAL_TABLET | Freq: Every morning | ORAL | 4 refills | Status: DC

## 2020-06-28 NOTE — Progress Notes (Signed)
I agree with the above note, plan 

## 2020-06-28 NOTE — Patient Instructions (Addendum)
If you are age 72 or older, your body mass index should be between 23-30. Your Body mass index is 34.7 kg/m. If this is out of the aforementioned range listed, please consider follow up with your Primary Care Provider.  If you are age 5 or younger, your body mass index should be between 19-25. Your Body mass index is 34.7 kg/m. If this is out of the aformentioned range listed, please consider follow up with your Primary Care Provider.   START Glycopyrrolate 2 mg 1 tablet every morning for lose stools. This has been sent to your pharmacy.  START Benefiber as directed.  We will be referring you to [facility] They are located at [place]. [place], [place], [place] can call them at [phone]  Call the office in a month or 2 to schedule a follow up in 3-4 months.  Thank you for entrusting me with your care and choosing [facility].  Amy Esterwood, PA-C

## 2020-06-28 NOTE — Progress Notes (Signed)
Subjective:    Patient ID: Harold Morris, male    DOB: 05-Jan-1951, 70 y.o.   MRN: [id number]  HPI Harold Morris is a pleasant 70 year old white male, new to GI today and [facility] Milford Cage for evaluation of abdominal pain.  Patient has history of diabetes, prior history of pulmonary embolus, is status post cholecystectomy and has had a stable 4 cm abdominal aortic aneurysm.  He has had prior thoracic compression fractures and previous surgery on his lumbar spine. He was diagnosed with bladder cancer in March 2021 He underwent a UroLift and cystoscopy lithopexy on 08/10/2019.  He then underwent cystoscopy and transurethral resection of bladder tumor with finding of a cluster of papillary tumor in the right side of the bladder diverticulum.  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  He has not required any other treatment. He has continued to complain of abdominal pain.  He is a poor historian but primarily on careful questioning is complaining of pain in the pelvis and bilateral groin area which has been bothering him for years but has progressed recently.  He says he has pain that radiates down anteriorly into his legs and that the pain in his groin area and pelvis is exacerbated by movement walking etc. and  generally will feel best laying down. He has had chronic symptoms with urgency postprandially and his wife says that he has had loose stools long-term and generally has 2-3 bowel movements per day postprandially.  Sometimes he feels fatigued postprandially and has to lie down. Patient says that he does not have "proper feeling" in his lower abdomen and pelvic area.  He has not noted any melena or hematochezia.  Weight has been stable appetite has been okay. He does have history of colon polyps and has had prior colonoscopies done through the [facility].  Review of his records in care everywhere show that he did have colonoscopy done through [facility] in 2017 with finding  of tubular adenomatous colon polyps and was indicated for 3-year interval follow-up.  There was no evidence of colitis on random biopsies. He has not had follow-up colonoscopy since. He also had CT of the abdomen pelvis done in November 2021 through [facility] urology with IV contrast which showed a small fat-containing umbilical hernia and a small left inguinal hernia, stable 3.7 cm abdominal aortic aneurysm and some thickening of the right bladder.  Patient says he had been put on an antidiarrheal several years ago which "stopped me up" and he was unable to tolerate. I was also able to review some of his previous orthopedic records.  He had been seen at [facility] in 2015 with lumbar spine myelogram which showed severe spinal stenosis at L3-L4 caused by a symmetric disc bulge moderate spinal stenosis L2-L3 severe foraminal narrowing on the right at L3-L4 secondary to osseous fragment and a centrally irregular lytic area in the medial left iliac bone adjacent to the SI likely representing donor site for fusion There was complete obliteration of the CSF space surrounding the nerve roots at the L3-L4 level. He says he had had 2 previous lumbar surgeries 1 done by Dr. Shellia Carwin here in [place] many years ago and then a more recent surgery several years ago Through the [facility] in [place].  Neither he or his wife know the name of his most recent Psychologist, sport and exercise and he says that he prefers to see a Licensed conveyancer here in [place] rather than going through the [facility].  Review of Systems Pertinent  positive and negative review of systems were noted in the above HPI section.  All other review of systems was otherwise negative.  Outpatient Encounter Medications as of 06/28/2020  Medication Sig  . allopurinol (ZYLOPRIM) 300 MG tablet Take 300 mg by mouth daily.   . Alogliptin Benzoate 6.25 MG TABS Take 1 tablet by mouth daily.   Marland Kitchen glycopyrrolate (ROBINUL) 2 MG tablet Take 1 tablet (2 mg total) by mouth in the morning. For loose stools   . hydrocortisone cream 1 % Apply 1 application topically as needed for itching.  . IBUPROFEN PO Take 500 mg by mouth as needed. Pt gets this from [facility]  . ketoconazole (NIZORAL) 2 % cream Apply 1 application topically as needed for irritation.  . lidocaine (XYLOCAINE) 5 % ointment Apply 1 application topically as needed.  . magnesium oxide (MAG-OX) 400 MG tablet Take 400 mg by mouth daily.  Marland Kitchen MELATONIN PO Take by mouth at bedtime.  Marland Kitchen omeprazole (PRILOSEC) 20 MG capsule Take 20 mg by mouth daily.   . rosuvastatin (CRESTOR) 40 MG tablet Take 40 mg by mouth at bedtime.  . sertraline (ZOLOFT) 100 MG tablet Take 100 mg by mouth daily.  . traMADol (ULTRAM) 50 MG tablet Take 1 tablet (50 mg total) by mouth every 6 (six) hours as needed.  . Vitamin D, Cholecalciferol, 25 MCG (1000 UT) TABS Take 3 tablets by mouth daily.   Marland Kitchen etodolac (LODINE) 400 MG tablet Take 400 mg by mouth 2 (two) times daily as needed.   Marland Kitchen PIOGLITAZONE HCL PO Take 15-45 mg by mouth daily. Pt has sliding scale ---   <145 takes 15mg  tab                                          145-175 takes 30 mg tab                                           >175  Takes 45mg  tab   . [DISCONTINUED] mirabegron ER (MYRBETRIQ) 25 MG TB24 tablet Take 25 mg by mouth daily.   No facility-administered encounter medications on file as of 06/28/2020.   Allergies  Allergen Reactions  . Epinephrine Anaphylaxis  . Sulfa Antibiotics Other (See Comments)    unknown  . Oxycontin [Oxycodone Hcl]     "I go crazy"  . Hydrocodone Itching    High strength dosing causes reaction  . Morphine Other (See Comments)    hallucinations   Patient Active Problem List   Diagnosis Date Noted  . Malignant neoplasm of urinary bladder ([place]) 01/18/2020  . Chronic pain due to trauma 08/24/2019  . Chronic sacroiliac joint pain 06/04/2019  . Sensory loss 05/08/2019  . Compression fracture of body of thoracic vertebra ([place]) 04/01/2019  . Myofascial pain 04/01/2019  . Cauda  equina compression ([place]) 02/17/2019  . Neurogenic bowel 02/17/2019  . Neurogenic bladder 02/17/2019  . Acute midline thoracic back pain 02/17/2019  . Chronic diarrhea 02/18/2015  . Hip pain 01/18/2015  . Acute pulmonary embolism ([place]) 01/01/2015  . DM type 2 (diabetes mellitus, type 2) ([place]) 01/01/2015  . Gout 01/01/2015  . History of lung cancer 01/01/2015   Social History   Socioeconomic History  . Marital status: Married    Spouse name: Not on  file  . Number of children: 2  . Years of education: 8  . Highest education level: Not on file  Occupational History  . Not on file  Tobacco Use  . Smoking status: Former Smoker    Years: 12.00    Types: Cigarettes    Quit date: 08/04/1983    Years since quitting: 36.9  . Smokeless tobacco: Current User    Types: Chew  Vaping Use  . Vaping Use: Never used  Substance and Sexual Activity  . Alcohol use: Yes    Alcohol/week: 7.0 standard drinks    Types: 7 Standard drinks or equivalent per week    Comment: one drink daily  . Drug use: No  . Sexual activity: Not on file  Other Topics Concern  . Not on file  Social History Narrative   Fun: Fish    Denies religious beliefs effecting health care.    Social Determinants of Health   Financial Resource Strain: Not on file  Food Insecurity: Not on file  Transportation Needs: Not on file  Physical Activity: Not on file  Stress: Not on file  Social Connections: Not on file  Intimate Partner Violence: Not on file    Mr. Reinheimer family history includes Cancer in his brother and sister; Cancer - Prostate in his brother; Diabetes in his mother.      Objective:    Vitals:   06/28/20 1339  BP: 120/80  Pulse: 82    Physical Exam Well-developed well-nourished elderly white male, accompanied by his wife in no acute distress.  Height, Weight, BMI  34.7 ambulates with difficulty due to leg and back pain  HEENT; nontraumatic normocephalic, EOMI, PE R LA, sclera  anicteric. Oropharynx; Neck; supple, no JVD Cardiovascular; regular rate and rhythm with S1-S2, no murmur rub or gallop Pulmonary; Clear bilaterally Abdomen; soft, mild non focal abdominal  tenderness, nondistended, no palpable mass or hepatosplenomegaly, bowel sounds are active, very small umbilical hernia Rectal; not done today Skin; benign exam, no jaundice rash or appreciable lesions Extremities; no clubbing cyanosis or edema skin warm and dry Neuro/Psych; alert and oriented x4, grossly nonfocal mood and affect appropriate       Assessment & Plan:   #37 70 year old white male referred for abdominal pain, with long history of vague chronic abdominal pain and loose stools.  Last colonoscopy 2017 done through [facility] with history of adenomatous colon polyps and removal of adenomatous colon polyps with recommendation for 3-year interval follow-up.  There was no evidence of colitis at that time Recent CT scan November 2021 as outlined above with small umbilical hernia and left inguinal hernia likely asymptomatic Patient's primary care concern today is more pelvic pain and bilateral groin pain with radicular symptoms bilaterally into the lower extremities with significant pain with ambulation and movement improved by lying down.  He also has poorly described symptoms of altered sensation in the pelvis. He has had 2 prior lumbar surgeries, I think his current symptoms are likely secondary to spinal stenosis and/or significant lower lumbar/sacral disc disease with radiculopathy.  I do not think he has a primary intra-abdominal/GI etiology for the symptoms.  #2 history of adenomatous colon polyps overdue for follow-up colonoscopy #3 history of bladder cancer, recent transurethral resection #4 adult onset diabetes mellitus 5.  Status post cholecystectomy 6.  Stable 4 cm abdominal aortic aneurysm  7.  GERD 8.  Depression  Plan; patient will be referred to [facility] for  further evaluation and imaging We  discussed scheduling follow-up colonoscopy which he wants to hold off on until after above evaluation.  He knows he is due for follow-up colonoscopy for colon polyps. We will give him a trial of glycopyrrolate 2 mg 1 p.o. every morning for chronic urgency and loose stool.  He is cautioned that if he has any change in urination or feeling of incomplete evacuation that he should discontinue Add Benefiber 2 teaspoons daily He and his wife are asked to call back regarding scheduling colonoscopy within the next few months.  Ashar Lewinski S Shirlie Enck PA-C 06/28/2020   Cc: [facility]

## 2020-07-06 ENCOUNTER — Telehealth: Payer: Self-pay | Admitting: Physical Medicine and Rehabilitation

## 2020-07-06 NOTE — Telephone Encounter (Signed)
Pt wife came to office today requesting someone call her. She would like to get a referral for patient  to go to [facility] for leg and pelvic pain.

## 2020-07-07 NOTE — Telephone Encounter (Signed)
Calling wife about pt's leg and pelvic pain.   Went to see GI and she thinks he has "nerve damage" in pelvis- was going to refer to Neurosurgery.    However, wanted imaging that was already done- explained to wife that had MRI of thoracic and lumbar spine 02/20/2019- and all it showed was T6/T12 compressions fractures that we know about, and previous surgeries- no signs of SCI, which I personally thought he had.  However when I see him next, if things have gotten worse, strength/sensation wise, I'm happy to reorder imaging- pt agreed with plan.

## 2020-07-08 DIAGNOSIS — C674 Malignant neoplasm of posterior wall of bladder: Secondary | ICD-10-CM | POA: Diagnosis not present

## 2020-07-12 ENCOUNTER — Other Ambulatory Visit: Payer: Self-pay | Admitting: Urology

## 2020-07-21 ENCOUNTER — Encounter (HOSPITAL_BASED_OUTPATIENT_CLINIC_OR_DEPARTMENT_OTHER): Payer: Self-pay | Admitting: Urology

## 2020-07-21 ENCOUNTER — Other Ambulatory Visit: Payer: Self-pay

## 2020-07-21 NOTE — Progress Notes (Signed)
Spoke w/ via phone for pre-op interview--- Pt's wife, Angie Fava needs dos---- [place] and EKG              Lab results------ no COVID test ------ 07-22-2020 @ 1000 Arrive at ------- 0930 on 07-26-2020 NPO after MN NO Solid Food.  Clear liquids from MN until--- 0730 (this in includes no chew tobacco after MN) Medications to take morning of surgery ----- Zoloft, Prilosec, Tramadol if needed Diabetic medication ----- do not take diabetic medication's morning of surgery Patient Special Instructions ----- n/a Pre-Op special Istructions ----- will need pt's wife in pre-op for medication since he is unsure what he takes Patient's wife verbalized understanding of instructions that were given at this phone interview. Patient's wife stated pt denies shortness of breath, chest pain, fever, cough at this phone interview.   Anesthesia :  Hx lung cancer s/p LLLobectomy 02/ 2011, completed chemo, no radiation (no recurrence per pt wife);  Hx PE 2016 , no clots since;  AAA ;  Pt's wife stated pt denies any cardiac s&s.    [facility] [place] Cardiologist : Dr Weyman Croon ([facility] in [place]) Chest x-ray :  CT 04-03-2019 epic EKG : 04-03-2019 epic Echo : 01-02-2015 epic Stress test:  Had a negative stress test in 2019 done at [facility] in [facility] Cardiac Cath :  no Activity level:  Gets a little sob w/ normal activities but recover quickly Sleep Study/ CPAP :  YES/ NO Fasting Blood Sugar :  120s    / Checks Blood Sugar -- times a day:  Every other day in am Blood Thinner/ Instructions /Last Dose: NO ASA / Instructions/ Last Dose : ASA 81mg /  Stated told by [name] office to stop 5 days prior to surgery, lase dose 07-20-2020

## 2020-07-22 ENCOUNTER — Other Ambulatory Visit (HOSPITAL_COMMUNITY)
Admission: RE | Admit: 2020-07-22 | Discharge: 2020-07-22 | Disposition: A | Discharge status: Home / Self Care | Payer: Medicare Other | Source: Ambulatory Visit | Attending: Urology | Admitting: Urology

## 2020-07-22 DIAGNOSIS — Z01812 Encounter for preprocedural laboratory examination: Secondary | ICD-10-CM | POA: Insufficient documentation

## 2020-07-22 DIAGNOSIS — Z20822 Contact with and (suspected) exposure to covid-19: Secondary | ICD-10-CM | POA: Diagnosis not present

## 2020-07-22 LAB — SARS CORONAVIRUS 2 (TAT 6-24 HRS): SARS Coronavirus 2: NEGATIVE
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
## 2020-07-26 ENCOUNTER — Other Ambulatory Visit: Payer: Self-pay

## 2020-07-26 ENCOUNTER — Ambulatory Visit (HOSPITAL_BASED_OUTPATIENT_CLINIC_OR_DEPARTMENT_OTHER): Payer: Medicare Other | Admitting: Certified Registered Nurse Anesthetist

## 2020-07-26 ENCOUNTER — Encounter (HOSPITAL_BASED_OUTPATIENT_CLINIC_OR_DEPARTMENT_OTHER): Payer: Self-pay | Admitting: Urology

## 2020-07-26 ENCOUNTER — Ambulatory Visit (HOSPITAL_BASED_OUTPATIENT_CLINIC_OR_DEPARTMENT_OTHER)
Admission: RE | Admit: 2020-07-26 | Discharge: 2020-07-26 | Disposition: A | Discharge status: Home / Self Care | Payer: Medicare Other | Attending: Urology | Admitting: Urology

## 2020-07-26 ENCOUNTER — Encounter (HOSPITAL_BASED_OUTPATIENT_CLINIC_OR_DEPARTMENT_OTHER)
Admission: RE | Disposition: A | Discharge status: Home / Self Care | Payer: Self-pay | Source: Home / Self Care | Attending: Urology

## 2020-07-26 DIAGNOSIS — E119 Type 2 diabetes mellitus without complications: Secondary | ICD-10-CM | POA: Diagnosis not present

## 2020-07-26 DIAGNOSIS — Z87891 Personal history of nicotine dependence: Secondary | ICD-10-CM | POA: Diagnosis not present

## 2020-07-26 DIAGNOSIS — N401 Enlarged prostate with lower urinary tract symptoms: Secondary | ICD-10-CM | POA: Insufficient documentation

## 2020-07-26 DIAGNOSIS — I714 Abdominal aortic aneurysm, without rupture: Secondary | ICD-10-CM | POA: Insufficient documentation

## 2020-07-26 DIAGNOSIS — C679 Malignant neoplasm of bladder, unspecified: Secondary | ICD-10-CM | POA: Diagnosis not present

## 2020-07-26 DIAGNOSIS — Z882 Allergy status to sulfonamides status: Secondary | ICD-10-CM | POA: Insufficient documentation

## 2020-07-26 DIAGNOSIS — Z902 Acquired absence of lung [part of]: Secondary | ICD-10-CM | POA: Diagnosis not present

## 2020-07-26 DIAGNOSIS — R3915 Urgency of urination: Secondary | ICD-10-CM | POA: Diagnosis not present

## 2020-07-26 DIAGNOSIS — Z888 Allergy status to other drugs, medicaments and biological substances status: Secondary | ICD-10-CM | POA: Diagnosis not present

## 2020-07-26 DIAGNOSIS — Z87442 Personal history of urinary calculi: Secondary | ICD-10-CM | POA: Diagnosis not present

## 2020-07-26 DIAGNOSIS — R3912 Poor urinary stream: Secondary | ICD-10-CM | POA: Insufficient documentation

## 2020-07-26 DIAGNOSIS — K4090 Unilateral inguinal hernia, without obstruction or gangrene, not specified as recurrent: Secondary | ICD-10-CM | POA: Insufficient documentation

## 2020-07-26 DIAGNOSIS — Z885 Allergy status to narcotic agent status: Secondary | ICD-10-CM | POA: Insufficient documentation

## 2020-07-26 DIAGNOSIS — R351 Nocturia: Secondary | ICD-10-CM | POA: Diagnosis not present

## 2020-07-26 DIAGNOSIS — N323 Diverticulum of bladder: Secondary | ICD-10-CM | POA: Insufficient documentation

## 2020-07-26 DIAGNOSIS — Z7984 Long term (current) use of oral hypoglycemic drugs: Secondary | ICD-10-CM | POA: Diagnosis not present

## 2020-07-26 DIAGNOSIS — C674 Malignant neoplasm of posterior wall of bladder: Secondary | ICD-10-CM | POA: Diagnosis not present

## 2020-07-26 DIAGNOSIS — I2699 Other pulmonary embolism without acute cor pulmonale: Secondary | ICD-10-CM | POA: Diagnosis not present

## 2020-07-26 DIAGNOSIS — Z85118 Personal history of other malignant neoplasm of bronchus and lung: Secondary | ICD-10-CM | POA: Diagnosis not present

## 2020-07-26 DIAGNOSIS — Z8551 Personal history of malignant neoplasm of bladder: Secondary | ICD-10-CM | POA: Insufficient documentation

## 2020-07-26 HISTORY — DX: Noninfective gastroenteritis and colitis, unspecified: K52.9

## 2020-07-26 HISTORY — PX: TRANSURETHRAL RESECTION OF BLADDER TUMOR: SHX2575

## 2020-07-26 HISTORY — PX: CYSTOSCOPY W/ RETROGRADES: SHX1426

## 2020-07-26 LAB — GLUCOSE, CAPILLARY: Glucose-Capillary: 138 mg/dL — ABNORMAL HIGH (ref 70–99)

## 2020-07-26 LAB — POCT I-STAT, CHEM 8
BUN: 21 mg/dL (ref 8–23)
Calcium, Ion: 1.3 mmol/L (ref 1.15–1.40)
Chloride: 101 mmol/L (ref 98–111)
Creatinine, Ser: 1.1 mg/dL (ref 0.61–1.24)
Glucose, Bld: 158 mg/dL — ABNORMAL HIGH (ref 70–99)
HCT: 46 % (ref 39.0–52.0)
Hemoglobin: 15.6 g/dL (ref 13.0–17.0)
Potassium: 3.8 mmol/L (ref 3.5–5.1)
Sodium: 142 mmol/L (ref 135–145)
TCO2: 27 mmol/L (ref 22–32)

## 2020-07-26 SURGERY — TURBT (TRANSURETHRAL RESECTION OF BLADDER TUMOR)
Anesthesia: General | Site: Pelvis

## 2020-07-26 MED ORDER — BELLADONNA ALKALOIDS-OPIUM 16.2-60 MG RE SUPP
RECTAL | Status: AC
  Filled 2020-07-26: qty 1

## 2020-07-26 MED ORDER — STERILE WATER FOR IRRIGATION IR SOLN
Status: DC | PRN
  Administered 2020-07-26: 2000 mL

## 2020-07-26 MED ORDER — ACETAMINOPHEN 500 MG PO TABS: 1000.0000 mg | ORAL_TABLET | Freq: Once | ORAL | Status: DC

## 2020-07-26 MED ORDER — CEFAZOLIN SODIUM-DEXTROSE 2-4 GM/100ML-% IV SOLN
2.0000 g | INTRAVENOUS | Status: AC
  Administered 2020-07-26: 2 g via INTRAVENOUS

## 2020-07-26 MED ORDER — PROMETHAZINE HCL 25 MG/ML IJ SOLN: 6.2500 mg | INTRAMUSCULAR | Status: DC | PRN

## 2020-07-26 MED ORDER — DEXAMETHASONE SODIUM PHOSPHATE 10 MG/ML IJ SOLN
INTRAMUSCULAR | Status: DC | PRN
  Administered 2020-07-26: 10 mg via INTRAVENOUS

## 2020-07-26 MED ORDER — TRAMADOL HCL 50 MG PO TABS: 50.0000 mg | ORAL_TABLET | Freq: Four times a day (QID) | ORAL | 0 refills | Status: DC | PRN

## 2020-07-26 MED ORDER — PROPOFOL 10 MG/ML IV BOLUS
INTRAVENOUS | Status: DC | PRN
  Administered 2020-07-26: 200 mg via INTRAVENOUS

## 2020-07-26 MED ORDER — IOHEXOL 300 MG/ML  SOLN
INTRAMUSCULAR | Status: DC | PRN
  Administered 2020-07-26: 14 mL via URETHRAL

## 2020-07-26 MED ORDER — FENTANYL CITRATE (PF) 100 MCG/2ML IJ SOLN
INTRAMUSCULAR | Status: AC
  Filled 2020-07-26: qty 2

## 2020-07-26 MED ORDER — LIDOCAINE HCL (PF) 2 % IJ SOLN
INTRAMUSCULAR | Status: AC
  Filled 2020-07-26: qty 5

## 2020-07-26 MED ORDER — OXYCODONE HCL 5 MG/5ML PO SOLN: 5.0000 mg | Freq: Once | ORAL | Status: DC | PRN

## 2020-07-26 MED ORDER — ONDANSETRON HCL 4 MG/2ML IJ SOLN
INTRAMUSCULAR | Status: DC | PRN
  Administered 2020-07-26: 4 mg via INTRAVENOUS

## 2020-07-26 MED ORDER — CEFAZOLIN SODIUM-DEXTROSE 2-4 GM/100ML-% IV SOLN
INTRAVENOUS | Status: AC
  Filled 2020-07-26: qty 100

## 2020-07-26 MED ORDER — MIDAZOLAM HCL 2 MG/2ML IJ SOLN: 0.5000 mg | Freq: Once | INTRAMUSCULAR | Status: DC | PRN

## 2020-07-26 MED ORDER — TRAMADOL HCL 50 MG PO TABS
ORAL_TABLET | ORAL | Status: AC
  Filled 2020-07-26: qty 1

## 2020-07-26 MED ORDER — OXYCODONE HCL 5 MG PO TABS: 5.0000 mg | ORAL_TABLET | Freq: Once | ORAL | Status: DC | PRN

## 2020-07-26 MED ORDER — LIDOCAINE 2% (20 MG/ML) 5 ML SYRINGE
INTRAMUSCULAR | Status: DC | PRN
  Administered 2020-07-26: 40 mg via INTRAVENOUS

## 2020-07-26 MED ORDER — DEXAMETHASONE SODIUM PHOSPHATE 10 MG/ML IJ SOLN
INTRAMUSCULAR | Status: AC
  Filled 2020-07-26: qty 1

## 2020-07-26 MED ORDER — LACTATED RINGERS IV SOLN: INTRAVENOUS | Status: DC

## 2020-07-26 MED ORDER — ONDANSETRON HCL 4 MG/2ML IJ SOLN
INTRAMUSCULAR | Status: AC
  Filled 2020-07-26: qty 2

## 2020-07-26 MED ORDER — TRAMADOL HCL 50 MG PO TABS
50.0000 mg | ORAL_TABLET | Freq: Once | ORAL | Status: AC
  Administered 2020-07-26: 50 mg via ORAL

## 2020-07-26 MED ORDER — MEPERIDINE HCL 25 MG/ML IJ SOLN: 6.2500 mg | INTRAMUSCULAR | Status: DC | PRN

## 2020-07-26 MED ORDER — FENTANYL CITRATE (PF) 100 MCG/2ML IJ SOLN
INTRAMUSCULAR | Status: DC | PRN
  Administered 2020-07-26 (×2): 50 ug via INTRAVENOUS

## 2020-07-26 MED ORDER — BELLADONNA ALKALOIDS-OPIUM 16.2-60 MG RE SUPP
RECTAL | Status: DC | PRN
  Administered 2020-07-26: 1 via RECTAL

## 2020-07-26 MED ORDER — HYDROMORPHONE HCL 1 MG/ML IJ SOLN: 0.2500 mg | INTRAMUSCULAR | Status: DC | PRN

## 2020-07-26 SURGICAL SUPPLY — 39 items
BAG DRAIN URO-CYSTO SKYTR STRL (DRAIN) ×3 IMPLANT
BAG DRN RND TRDRP ANRFLXCHMBR (UROLOGICAL SUPPLIES)
BAG DRN UROCATH (DRAIN) ×2
BAG URINE DRAIN 2000ML AR STRL (UROLOGICAL SUPPLIES) IMPLANT
BAG URINE LEG 500ML (DRAIN) IMPLANT
BULB IRRIG PATHFIND (MISCELLANEOUS) IMPLANT
CATH FOLEY 2WAY SLVR  5CC 20FR (CATHETERS)
CATH FOLEY 2WAY SLVR  5CC 22FR (CATHETERS)
CATH FOLEY 2WAY SLVR 5CC 20FR (CATHETERS) IMPLANT
CATH FOLEY 2WAY SLVR 5CC 22FR (CATHETERS) IMPLANT
CATH URET 5FR 28IN CONE TIP (BALLOONS)
CATH URET 5FR 28IN OPEN ENDED (CATHETERS) ×1 IMPLANT
CATH URET 5FR 70CM CONE TIP (BALLOONS) IMPLANT
CLOTH BEACON ORANGE TIMEOUT ST (SAFETY) ×3 IMPLANT
DRSG TELFA 3X8 NADH (GAUZE/BANDAGES/DRESSINGS) ×3 IMPLANT
ELECT REM PT RETURN 9FT ADLT (ELECTROSURGICAL) ×3
ELECTRODE REM PT RTRN 9FT ADLT (ELECTROSURGICAL) ×2 IMPLANT
EVACUATOR MICROVAS BLADDER (UROLOGICAL SUPPLIES) IMPLANT
FIBER LASER FLEXIVA 365 (UROLOGICAL SUPPLIES) IMPLANT
GLOVE SURG ENC MOIS LTX SZ7.5 (GLOVE) ×3 IMPLANT
GOWN STRL REUS W/TWL LRG LVL3 (GOWN DISPOSABLE) ×3 IMPLANT
GUIDEWIRE ANG ZIPWIRE 038X150 (WIRE) IMPLANT
GUIDEWIRE STR DUAL SENSOR (WIRE) ×1 IMPLANT
HOLDER FOLEY CATH W/STRAP (MISCELLANEOUS) IMPLANT
KIT TURNOVER CYSTO (KITS) ×3 IMPLANT
LOOP MONOPOLAR YLW (ELECTROSURGICAL) IMPLANT
MANIFOLD NEPTUNE II (INSTRUMENTS) ×1 IMPLANT
NDL SAFETY ECLIPSE 18X1.5 (NEEDLE) IMPLANT
NEEDLE HYPO 18GX1.5 SHARP (NEEDLE) ×3
PACK CYSTO (CUSTOM PROCEDURE TRAY) ×3 IMPLANT
PAD DRESSING TELFA 3X8 NADH (GAUZE/BANDAGES/DRESSINGS) IMPLANT
PLUG CATH AND CAP STER (CATHETERS) IMPLANT
SYR 20ML LL LF (SYRINGE) ×3 IMPLANT
SYR TOOMEY IRRIG 70ML (MISCELLANEOUS)
SYRINGE TOOMEY IRRIG 70ML (MISCELLANEOUS) IMPLANT
TRACTIP FLEXIVA PULS ID 200XHI (Laser) IMPLANT
TRACTIP FLEXIVA PULSE ID 200 (Laser)
TUBE CONNECTING 12X1/4 (SUCTIONS) ×1 IMPLANT
WATER STERILE IRR 3000ML UROMA (IV SOLUTION) ×1 IMPLANT

## 2020-07-26 NOTE — Op Note (Signed)
Preoperative diagnosis:  1.  Recurrent bladder cancer  Postoperative diagnosis: 1.  Same  Procedure(s): 1.  Cystoscopy, bilateral retrograde pyelogram with intraoperative interpretation, transurethral section of bladder tumor (medium size)  Surgeon: Dr. Harold Barban  Anesthesia: General  Complications: None  EBL: Minimal  Specimens: Bladder tumor  Disposition of specimens: Specimen to pathology  Intraoperative findings: Recurrent papillary tumor involving the left lateral edge of right sided bladder diverticulum and extending just into the floor the bladder and just inside the bladder diverticulum.  Tumor was removed utilizing cold cup biopsy forceps and then fulgurated.  Bilateral retrogrades were normal  Indication: Patient is a 70 year old white male with history of high-grade transitional cell carcinoma the bladder is undergoing TURBT and BCG.  Has had 1 recurrence within a right-sided bladder diverticulum.  Surveillance cystoscopy shows recurrent tumor on the right lateral edge of the bladder diverticulum here for cystoscopy TURBT and retrogrades  Description of procedure:  After obtaining for consent for the patient he was taken the major cystoscopy suite placed under general anesthesia.  Placed in dorsolithotomy position genitalia prepped and labial sterile fashion.  Proper pause timeout was performed.  21 [place] scope was advanced in the bladder without difficulty.  Pendulous prostatic urethra appear grossly normal.  Bladder thoroughly spread with 30 and 70 degree lenses with above-noted findings.  The tumor approaches the inside lip of the bladder diverticulum I was hesitant to do loop resection at this point.  Utilizing the rigid biopsy forceps I was able to remove and sample of the tumor on the edge of the bladder diverticulum and in the floor the bladder.  I then fulgurated this area completely with the Bugbee electrode no visible tumor was remaining.  Previously resected  site in the bladder diverticulum was clear of tumor.  Bilateral retrograde pyelograms were performed with a 5 French open tip catheter revealing normal filling of both upper tract without evidence of filling defect or hydronephrosis.  Both upper tracts emptied out promptly upon removal of the retrograde catheter.  Bladder was emptied and the area reinspected for hemostasis and this was good.  Procedure was terminated he was awakened from anesthesia and taken back to the recovery room in stable condition.  No immediate complication from the procedure.

## 2020-07-26 NOTE — Anesthesia Procedure Notes (Signed)
Procedure Name: LMA Insertion Date/Time: 07/26/2020 11:41 AM Performed by: Genelle Bal, CRNA Pre-anesthesia Checklist: Patient identified, Emergency Drugs available, Suction available and Patient being monitored Patient Re-evaluated:Patient Re-evaluated prior to induction Oxygen Delivery Method: Circle system utilized Preoxygenation: Pre-oxygenation with 100% oxygen Induction Type: IV induction Ventilation: Mask ventilation without difficulty LMA: LMA inserted LMA Size: 5.0 Number of attempts: 1 Airway Equipment and Method: Bite block Placement Confirmation: positive ETCO2 Tube secured with: Tape Dental Injury: Teeth and Oropharynx as per pre-operative assessment

## 2020-07-26 NOTE — Discharge Instructions (Signed)
Transurethral Procedure  Medications: Resume all your other meds from home  Activity: 1. No heavy lifting > 10 pounds for 2 weeks. 2. No sexual activity for 2 weeks. 3. No strenuous activity for 2 weeks. 4. No driving while on narcotic pain medications. 5. Drink plenty of water. 6. Continue to walk at home - you can still get blood clots when you are at home so keep     active but don't over do it. 7. Your urine may have some blood in it - make sure you drink plenty of water. Call or           come to the ER immediately if you catheter stops draining or you are unable to urinate.  Bathing: You can shower. You can take a bath unless you have a foley catheter in place.  Signs / Symptoms to call: 1. Call if you have a fever greater than 101.5  2. Uncontrolled nausea / vomiting, uncontrolled pain / dizziness, unable to urinate, leg         swelling / leg pain, or any other concerns.   Post Anesthesia Home Care Instructions  Activity: Get plenty of rest for the remainder of the day. A responsible individual must stay with you for 24 hours following the procedure.  For the next 24 hours, DO NOT: -Drive a car -Paediatric nurse -Drink alcoholic beverages -Take any medication unless instructed by your physician -Make any legal decisions or sign important papers.  Meals: Start with liquid foods such as gelatin or soup. Progress to regular foods as tolerated. Avoid greasy, spicy, heavy foods. If nausea and/or vomiting occur, drink only clear liquids until the nausea and/or vomiting subsides. Call your physician if vomiting continues.  Special Instructions/Symptoms: Your throat may feel dry or sore from the anesthesia or the breathing tube placed in your throat during surgery. If this causes discomfort, gargle with warm salt water. The discomfort should disappear within 24 hours.

## 2020-07-26 NOTE — Interval H&P Note (Signed)
History and Physical Interval Note:  07/26/2020 11:18 AM  Harold Morris  has presented today for surgery, with the diagnosis of BLADDER CANCER.  The various methods of treatment have been discussed with the patient and family. After consideration of risks, benefits and other options for treatment, the patient has consented to  Procedure(s) with comments: TRANSURETHRAL RESECTION OF BLADDER TUMOR (TURBT) (N/A) - 45 MINS CYSTOSCOPY WITH RETROGRADE PYELOGRAM (Bilateral) as a surgical intervention.  The patient's history has been reviewed, patient examined, no change in status, stable for surgery.  I have reviewed the patient's chart and labs.  Questions were answered to the patient's satisfaction.     Remi Haggard

## 2020-07-26 NOTE — Anesthesia Preprocedure Evaluation (Addendum)
Anesthesia Evaluation  Patient identified by MRN, date of birth, ID band Patient awake    Reviewed: Allergy & Precautions, NPO status , Patient's Chart, lab work & pertinent test results  History of Anesthesia Complications Negative for: history of anesthetic complications  Airway Mallampati: II  TM Distance: >3 FB Neck ROM: Full    Dental  (+) Edentulous Upper, Edentulous Lower   Pulmonary sleep apnea (does not use his CPAP) , former smoker, PE (2016) H/o LL lobectomy, chemo for lung ca 07/22/2020 SARS coronavirus NEG   breath sounds clear to auscultation       Cardiovascular (-) angina+ Peripheral Vascular Disease (AAA 4.3 cm)   Rhythm:Regular Rate:Normal  '16 ECHO: EF 60-65%. no regional wall motion abnormalities. grade 2 diastolic dysfunction, mild MR   Neuro/Psych Chronic back pain    GI/Hepatic Neg liver ROS, GERD  Medicated and Controlled,  Endo/Other  diabetes (glu 158), Oral Hypoglycemic AgentsMorbid obesity  Renal/GU    Bladder cancer    Musculoskeletal  (+) Arthritis ,   Abdominal (+) + obese,   Peds  Hematology negative hematology ROS (+)   Anesthesia Other Findings   Reproductive/Obstetrics                            Anesthesia Physical Anesthesia Plan  ASA: III  Anesthesia Plan: General   Post-op Pain Management:    Induction: Intravenous  PONV Risk Score and Plan: 2 and Ondansetron and Dexamethasone  Airway Management Planned: LMA  Additional Equipment: None  Intra-op Plan:   Post-operative Plan:   Informed Consent: I have reviewed the patients History and Physical, chart, labs and discussed the procedure including the risks, benefits and alternatives for the proposed anesthesia with the patient or authorized representative who has indicated his/her understanding and acceptance.     Dental advisory given  Plan Discussed with: CRNA and  Surgeon  Anesthesia Plan Comments:         Anesthesia Quick Evaluation

## 2020-07-26 NOTE — Anesthesia Postprocedure Evaluation (Signed)
Anesthesia Post Note  Patient: Harold Morris  Procedure(s) Performed: TRANSURETHRAL RESECTION OF BLADDER TUMOR (TURBT) (N/A Bladder) CYSTOSCOPY WITH RETROGRADE PYELOGRAM (Bilateral Pelvis)     Patient location during evaluation: Phase II Anesthesia Type: General Level of consciousness: awake and alert, patient cooperative and oriented Pain management: pain level controlled Vital Signs Assessment: post-procedure vital signs reviewed and stable Respiratory status: spontaneous breathing, nonlabored ventilation and respiratory function stable Cardiovascular status: blood pressure returned to baseline and stable Postop Assessment: no apparent nausea or vomiting, adequate PO intake and able to ambulate Anesthetic complications: no   No complications documented.  Last Vitals:  Vitals:   07/26/20 1300 07/26/20 1355  BP: (!) 151/84 (!) 160/90  Pulse: 63 69  Resp: 16 20  Temp: (!) 36.3 C   SpO2: 98% 100%    Last Pain:  Vitals:   07/26/20 1355  TempSrc:   PainSc: 3                  Ileanna Gemmill,E. Maxamus Colao

## 2020-07-26 NOTE — Transfer of Care (Signed)
Immediate Anesthesia Transfer of Care Note  Patient: Harold Morris  Procedure(s) Performed: TRANSURETHRAL RESECTION OF BLADDER TUMOR (TURBT) (N/A Bladder) CYSTOSCOPY WITH RETROGRADE PYELOGRAM (Bilateral Pelvis)  Patient Location: PACU  Anesthesia Type:General  Level of Consciousness: awake, alert  and oriented  Airway & Oxygen Therapy: Patient Spontanous Breathing and Patient connected to face mask oxygen  Post-op Assessment: Report given to RN and Post -op Vital signs reviewed and stable  Post vital signs: Reviewed and stable  Last Vitals:  Vitals Value Taken Time  BP 142/93 07/26/20 1224  Temp    Pulse 71 07/26/20 1227  Resp 15 07/26/20 1227  SpO2 100 % 07/26/20 1227  Vitals shown include unvalidated device data.  Last Pain:  Vitals:   07/26/20 0958  TempSrc: Oral  PainSc: 2       Patients Stated Pain Goal: 4 (64/31/42 7670)  Complications: No complications documented.

## 2020-07-27 ENCOUNTER — Encounter (HOSPITAL_BASED_OUTPATIENT_CLINIC_OR_DEPARTMENT_OTHER): Payer: Self-pay | Admitting: Urology

## 2020-07-27 LAB — SURGICAL PATHOLOGY

## 2020-08-03 DIAGNOSIS — C674 Malignant neoplasm of posterior wall of bladder: Secondary | ICD-10-CM | POA: Diagnosis not present

## 2020-08-05 ENCOUNTER — Other Ambulatory Visit: Payer: Self-pay

## 2020-08-05 ENCOUNTER — Telehealth: Payer: Self-pay

## 2020-08-05 ENCOUNTER — Encounter
Payer: Medicare Other | Attending: Physical Medicine and Rehabilitation | Admitting: Physical Medicine and Rehabilitation

## 2020-08-05 ENCOUNTER — Encounter: Payer: Self-pay | Admitting: Physical Medicine and Rehabilitation

## 2020-08-05 VITALS — BP 127/84 | HR 77 | Temp 98.8°F | Ht 69.0 in | Wt 239.0 lb

## 2020-08-05 DIAGNOSIS — M79604 Pain in right leg: Secondary | ICD-10-CM

## 2020-08-05 DIAGNOSIS — R269 Unspecified abnormalities of gait and mobility: Secondary | ICD-10-CM | POA: Diagnosis not present

## 2020-08-05 DIAGNOSIS — C679 Malignant neoplasm of bladder, unspecified: Secondary | ICD-10-CM

## 2020-08-05 DIAGNOSIS — G8921 Chronic pain due to trauma: Secondary | ICD-10-CM

## 2020-08-05 DIAGNOSIS — H8113 Benign paroxysmal vertigo, bilateral: Secondary | ICD-10-CM

## 2020-08-05 DIAGNOSIS — R1031 Right lower quadrant pain: Secondary | ICD-10-CM

## 2020-08-05 DIAGNOSIS — S22000A Wedge compression fracture of unspecified thoracic vertebra, initial encounter for closed fracture: Secondary | ICD-10-CM

## 2020-08-05 MED ORDER — TRAMADOL HCL 50 MG PO TABS: 50.0000 mg | ORAL_TABLET | Freq: Four times a day (QID) | ORAL | 5 refills | Status: DC | PRN

## 2020-08-05 NOTE — Progress Notes (Addendum)
Pt is a 70 yr old male with hx of 2 Thoracic compression fractures as well as bladder cancer in treatment- recurrent as of 07/26/20- here for trigger point injections-  Going to do 6 more weeks of chemo wash- limited on supply- to be put on top of list.  At least another 1 week until start again.   Might have to partial bladder removal- thin walled area- no muscle there.  Kidneys look good.   Hasn't had to take a Dizzy" pill per wife.  Thinks they helped "some? But not much .  Wife hasn't been giving them to him, but he says he went into the bottle and get 1 out,  If lays down, dizzy to start with.   Never been seen by PT for dizziness.  Meclizine helps slightly, but not a lot- so think that PT referral is the best choice.    Wants trigger point injections today.   R groin pain- feels like it's muscular- pulling very badly.  Had to make a stool to get into bed now.   Exam: Doesn't have RW in here today- accompanied by wife, making jokes, NAD  TTP over R Gracilis- less so on [place]. So TTP made him almost collapse when palpated due to pain.  No swelling felt or seen- no erythema    Assessment: Pt is a 70 yr old male with hx of 2 Thoracic compression fractures as well as bladder cancer in treatment- recurrent as of 07/26/20- here for trigger point injections-  Plan: 1. Renew Tramadol Rx 50 mg q6 hours prn- #120 with 5 refills   2. Ask about further staging- CT of chest/abdomen/Pelvis- and if not appropriate, then if they could document, esp since he has compression fx's- which could be a early sign of cancer.   3. PT referral- for vertigo- will get front desk to get number so can get scheduled ASAP.   4. Think needs to do U/S or CT to assess R gracilis- why it's SO TTP- will see if can ask Dr Harold Morris.   5. Patient here for trigger point injections for  Consent done and on chart.  Cleaned areas with alcohol and injected using a 27 gauge 1.5 inch needle  Injected 7.5  cc Using 1% Lidocaine with no EPI  Upper traps B/L Levators B/L Posterior scalenes B/L x2 Middle scalenes Splenius Capitus B/L Pectoralis Major B/L Rhomboids B/Lx2 Infraspinatus Teres Major/minor Thoracic paraspinals B/Lx3 Lumbar paraspinals B/L x2 Other injections-    Patient's level of pain prior was 4/10 Current level of pain after injections is just under 4/10  There was no bleeding or complications.  Patient was advised to drink a lot of water on day after injections to flush system Will have increased soreness for 12-48 hours after injections.  Can use Lidocaine patches the day AFTER injections Can use theracane on day of injections in places didn't inject Can use heating pad 4-6 hours AFTER injections  6.  Use RW- to walk!  7. F/U - will see if Dr Harold Morris ok getting trigger point injections with bladder chemo washes-    8.  Wait on muscle relaxers- since doesn't respond well to them. Getting imaging first.  Addendum- spoke to Dr Harold Morris- agreed we can get CT with and without contrast of Abd/pelvis- will also get CT same of RLE- due to severe R groin pain   I spent a total of 40 minutes on visit- also called Urologist when in room with p- on hold 22  minutes, but had to call directly later.Marland Kitchen

## 2020-08-05 NOTE — Patient Instructions (Addendum)
Assessment: Pt is a 70 yr old male with hx of 2 Thoracic compression fractures as well as bladder cancer in treatment- recurrent as of 07/26/20- here for trigger point injections-  Plan: 1. Renew Tramadol Rx 50 mg q6 hours prn- #120 with 5 refills   2. Ask about further staging- CT of chest/abdomen/Pelvis- and if not appropriate, then if they could document, esp since he has compression fx's- which could be a early sign of cancer.   3. PT referral- for vertigo- will get front desk to get number so can get scheduled ASAP.   4. Think needs to do U/S or CT to assess R gracilis- why it's SO TTP- will see if can ask [name].   5. Patient here for trigger point injections for  Consent done and on chart.  Cleaned areas with alcohol and injected using a 27 gauge 1.5 inch needle  Injected 7.5 cc Using 1% Lidocaine with no EPI  Upper traps B/L Levators B/L Posterior scalenes B/L x2 Middle scalenes Splenius Capitus B/L Pectoralis Major B/L Rhomboids B/Lx2 Infraspinatus Teres Major/minor Thoracic paraspinals B/Lx3 Lumbar paraspinals B/L x2 Other injections-    Patient's level of pain prior was 4/10 Current level of pain after injections is just under 4/10  There was no bleeding or complications.  Patient was advised to drink a lot of water on day after injections to flush system Will have increased soreness for 12-48 hours after injections.  Can use Lidocaine patches the day AFTER injections Can use theracane on day of injections in places didn't inject Can use heating pad 4-6 hours AFTER injections  6.  Use RW- to walk!  7. F/U - will see if [name] ok getting trigger point injections with bladder chemo washes-   8. Wait on muscle relaxers- since doesn't respond well to them. Getting imaging first.

## 2020-08-05 NOTE — Addendum Note (Signed)
Addended by: Courtney Heys on: 08/05/2020 10:45 AM   Modules accepted: Orders

## 2020-08-08 NOTE — Addendum Note (Signed)
Addended by: Courtney Heys on: 08/08/2020 04:10 PM   Modules accepted: Orders

## 2020-08-08 NOTE — Telephone Encounter (Signed)
Please call Crystal at [facility], about patient pending imaging orders.

## 2020-08-10 ENCOUNTER — Other Ambulatory Visit: Payer: Self-pay

## 2020-08-10 ENCOUNTER — Ambulatory Visit: Payer: Medicare Other | Attending: Physical Medicine and Rehabilitation

## 2020-08-10 ENCOUNTER — Telehealth: Payer: Self-pay

## 2020-08-10 VITALS — BP 128/72

## 2020-08-10 DIAGNOSIS — R42 Dizziness and giddiness: Secondary | ICD-10-CM | POA: Diagnosis not present

## 2020-08-10 DIAGNOSIS — R2681 Unsteadiness on feet: Secondary | ICD-10-CM | POA: Diagnosis not present

## 2020-08-10 DIAGNOSIS — R262 Difficulty in walking, not elsewhere classified: Secondary | ICD-10-CM | POA: Diagnosis not present
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
## 2020-08-10 NOTE — Telephone Encounter (Signed)
Dr. Dagoberto Ligas,  Harold Morris was evaluated by Physical Therapy on 08/10/20 for Dizziness/BPPV. Upon further assessment, no BPPV noted with positional testing. Patient reporting very inconsistent findings throughout evaluation along with episodes of near syncope, along with sharp burning posterior to left Eye and in left posterior head. Patient very inattentive and often drowsy during session. Vitals WNL today. Patient also reporting spontaneous onset of symptoms at times vs. with positional movements. I believe this patient may be beneficial to be further evaluated by Neurology. Wife reported that he was only followed by Neurology in [place] but that has been quite some time, and was interested in [place] if possible. Please let me know your thoughts.   If you agree, please place an order in [facility]que in [facility] or fax the order to [phone]. Thank you, Guillermina [place], PT, [facility] [address] [place], [place]  [address] Phone:  [phone] Fax:  [phone]

## 2020-08-22 ENCOUNTER — Other Ambulatory Visit: Payer: Self-pay

## 2020-08-22 ENCOUNTER — Encounter (HOSPITAL_COMMUNITY): Payer: Self-pay

## 2020-08-22 ENCOUNTER — Emergency Department (HOSPITAL_COMMUNITY): Payer: No Typology Code available for payment source

## 2020-08-22 ENCOUNTER — Emergency Department (HOSPITAL_COMMUNITY)
Admission: EM | Admit: 2020-08-22 | Discharge: 2020-08-22 | Disposition: A | Discharge status: Home / Self Care | Payer: No Typology Code available for payment source | Attending: Emergency Medicine | Admitting: Emergency Medicine

## 2020-08-22 DIAGNOSIS — R319 Hematuria, unspecified: Secondary | ICD-10-CM

## 2020-08-22 DIAGNOSIS — Z7982 Long term (current) use of aspirin: Secondary | ICD-10-CM | POA: Diagnosis not present

## 2020-08-22 DIAGNOSIS — Z96652 Presence of left artificial knee joint: Secondary | ICD-10-CM | POA: Diagnosis not present

## 2020-08-22 DIAGNOSIS — Z85118 Personal history of other malignant neoplasm of bronchus and lung: Secondary | ICD-10-CM | POA: Diagnosis not present

## 2020-08-22 DIAGNOSIS — M1611 Unilateral primary osteoarthritis, right hip: Secondary | ICD-10-CM | POA: Diagnosis not present

## 2020-08-22 DIAGNOSIS — M79604 Pain in right leg: Secondary | ICD-10-CM

## 2020-08-22 DIAGNOSIS — Z8551 Personal history of malignant neoplasm of bladder: Secondary | ICD-10-CM | POA: Diagnosis not present

## 2020-08-22 DIAGNOSIS — N3001 Acute cystitis with hematuria: Secondary | ICD-10-CM | POA: Insufficient documentation

## 2020-08-22 DIAGNOSIS — M1711 Unilateral primary osteoarthritis, right knee: Secondary | ICD-10-CM | POA: Diagnosis not present

## 2020-08-22 DIAGNOSIS — M79661 Pain in right lower leg: Secondary | ICD-10-CM | POA: Insufficient documentation

## 2020-08-22 DIAGNOSIS — M25461 Effusion, right knee: Secondary | ICD-10-CM | POA: Diagnosis not present

## 2020-08-22 DIAGNOSIS — R109 Unspecified abdominal pain: Secondary | ICD-10-CM | POA: Diagnosis not present

## 2020-08-22 DIAGNOSIS — R52 Pain, unspecified: Secondary | ICD-10-CM

## 2020-08-22 DIAGNOSIS — Z7984 Long term (current) use of oral hypoglycemic drugs: Secondary | ICD-10-CM | POA: Insufficient documentation

## 2020-08-22 DIAGNOSIS — E119 Type 2 diabetes mellitus without complications: Secondary | ICD-10-CM | POA: Insufficient documentation

## 2020-08-22 DIAGNOSIS — Z87891 Personal history of nicotine dependence: Secondary | ICD-10-CM | POA: Diagnosis not present

## 2020-08-22 DIAGNOSIS — K760 Fatty (change of) liver, not elsewhere classified: Secondary | ICD-10-CM | POA: Diagnosis not present

## 2020-08-22 DIAGNOSIS — M545 Low back pain, unspecified: Secondary | ICD-10-CM | POA: Diagnosis not present

## 2020-08-22 DIAGNOSIS — M79651 Pain in right thigh: Secondary | ICD-10-CM | POA: Diagnosis not present

## 2020-08-22 LAB — CBC
HCT: 42.6 % (ref 39.0–52.0)
Hemoglobin: 14.3 g/dL (ref 13.0–17.0)
MCH: 30.4 pg (ref 26.0–34.0)
MCHC: 33.6 g/dL (ref 30.0–36.0)
MCV: 90.4 fL (ref 80.0–100.0)
Platelets: 150 10*3/uL (ref 150–400)
RBC: 4.71 MIL/uL (ref 4.22–5.81)
RDW: 14.6 % (ref 11.5–15.5)
WBC: 7.9 10*3/uL (ref 4.0–10.5)
nRBC: 0 % (ref 0.0–0.2)

## 2020-08-22 LAB — URINALYSIS, ROUTINE W REFLEX MICROSCOPIC
RBC / HPF: >50 RBC/hpf — ABNORMAL HIGH (ref 0–5)
WBC, UA: >50 WBC/hpf — ABNORMAL HIGH (ref 0–5)

## 2020-08-22 LAB — BASIC METABOLIC PANEL
Anion gap: 8 (ref 5–15)
BUN: 14 mg/dL (ref 8–23)
CO2: 24 mmol/L (ref 22–32)
Calcium: 9.1 mg/dL (ref 8.9–10.3)
Chloride: 105 mmol/L (ref 98–111)
Creatinine, Ser: 1.13 mg/dL (ref 0.61–1.24)
GFR, Estimated: >60 mL/min (ref >60)
Glucose, Bld: 159 mg/dL — ABNORMAL HIGH (ref 70–99)
Potassium: 3.6 mmol/L (ref 3.5–5.1)
Sodium: 137 mmol/L (ref 135–145)

## 2020-08-22 MED ORDER — CEPHALEXIN 500 MG PO CAPS: 500.0000 mg | ORAL_CAPSULE | Freq: Two times a day (BID) | ORAL | 0 refills | Status: AC

## 2020-08-22 MED ORDER — FENTANYL CITRATE (PF) 100 MCG/2ML IJ SOLN
50.0000 ug | Freq: Once | INTRAMUSCULAR | Status: AC
Start: 2020-08-22 — End: 2020-08-22
  Administered 2020-08-22: 50 ug via INTRAVENOUS
  Filled 2020-08-22: qty 2

## 2020-08-22 MED ORDER — IOHEXOL 300 MG/ML  SOLN
100.0000 mL | Freq: Once | INTRAMUSCULAR | Status: AC | PRN
  Administered 2020-08-22: 100 mL via INTRAVENOUS

## 2020-08-22 MED ORDER — METHYLPREDNISOLONE 4 MG PO TBPK: ORAL_TABLET | ORAL | 0 refills | Status: DC

## 2020-08-22 NOTE — ED Triage Notes (Signed)
Pt reports that he is having a lot of blood urine. Started around 11:00pm. History of bladder cancer is suppose to have chemo wash on Friday. Denies any pain.
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
## 2020-08-22 NOTE — Discharge Instructions (Signed)
Take antibiotics as prescribed.  Follow-up with your urologist.  Talk to your rehab doctor about starting steroids for your hip/thigh pain/back pain.  Follow-up closely with them as well.  Please return if you develop any urinary retention, worsening symptoms.

## 2020-08-22 NOTE — ED Notes (Signed)
Pt returned from CT °

## 2020-08-22 NOTE — ED Notes (Signed)
Unable to obtain IV access. Another RN to attempt.

## 2020-08-23 LAB — URINE CULTURE: Culture: <10000 — AB

## 2020-08-24 ENCOUNTER — Ambulatory Visit
Admission: RE | Admit: 2020-08-24 | Discharge: 2020-08-24 | Disposition: A | Discharge status: Home / Self Care | Payer: Medicare Other | Source: Ambulatory Visit | Attending: Physical Medicine and Rehabilitation | Admitting: Physical Medicine and Rehabilitation

## 2020-08-24 DIAGNOSIS — C679 Malignant neoplasm of bladder, unspecified: Secondary | ICD-10-CM

## 2020-08-24 DIAGNOSIS — J984 Other disorders of lung: Secondary | ICD-10-CM | POA: Diagnosis not present

## 2020-08-24 DIAGNOSIS — I2510 Atherosclerotic heart disease of native coronary artery without angina pectoris: Secondary | ICD-10-CM | POA: Diagnosis not present

## 2020-08-24 DIAGNOSIS — Z85118 Personal history of other malignant neoplasm of bronchus and lung: Secondary | ICD-10-CM | POA: Diagnosis not present

## 2020-08-24 DIAGNOSIS — I700 Atherosclerosis of aorta: Secondary | ICD-10-CM | POA: Diagnosis not present

## 2020-08-24 MED ORDER — IOPAMIDOL (ISOVUE-300) INJECTION 61%
75.0000 mL | Freq: Once | INTRAVENOUS | Status: AC | PRN
  Administered 2020-08-24: 75 mL via INTRAVENOUS

## 2020-09-02 DIAGNOSIS — C674 Malignant neoplasm of posterior wall of bladder: Secondary | ICD-10-CM | POA: Diagnosis not present

## 2020-09-02 DIAGNOSIS — Z5111 Encounter for antineoplastic chemotherapy: Secondary | ICD-10-CM | POA: Diagnosis not present

## 2020-09-16 DIAGNOSIS — C674 Malignant neoplasm of posterior wall of bladder: Secondary | ICD-10-CM | POA: Diagnosis not present

## 2020-09-16 DIAGNOSIS — Z5111 Encounter for antineoplastic chemotherapy: Secondary | ICD-10-CM | POA: Diagnosis not present

## 2020-09-23 ENCOUNTER — Ambulatory Visit: Payer: Medicare Other | Admitting: Physical Medicine and Rehabilitation

## 2020-09-23 DIAGNOSIS — Z5111 Encounter for antineoplastic chemotherapy: Secondary | ICD-10-CM | POA: Diagnosis not present

## 2020-09-23 DIAGNOSIS — C674 Malignant neoplasm of posterior wall of bladder: Secondary | ICD-10-CM | POA: Diagnosis not present

## 2020-09-30 DIAGNOSIS — C674 Malignant neoplasm of posterior wall of bladder: Secondary | ICD-10-CM | POA: Diagnosis not present

## 2020-09-30 DIAGNOSIS — R8279 Other abnormal findings on microbiological examination of urine: Secondary | ICD-10-CM | POA: Diagnosis not present

## 2020-10-07 DIAGNOSIS — Z5111 Encounter for antineoplastic chemotherapy: Secondary | ICD-10-CM | POA: Diagnosis not present

## 2020-10-07 DIAGNOSIS — C674 Malignant neoplasm of posterior wall of bladder: Secondary | ICD-10-CM | POA: Diagnosis not present

## 2020-10-14 DIAGNOSIS — C674 Malignant neoplasm of posterior wall of bladder: Secondary | ICD-10-CM | POA: Diagnosis not present

## 2020-10-14 DIAGNOSIS — N3000 Acute cystitis without hematuria: Secondary | ICD-10-CM | POA: Diagnosis not present

## 2020-10-21 DIAGNOSIS — C674 Malignant neoplasm of posterior wall of bladder: Secondary | ICD-10-CM | POA: Diagnosis not present

## 2020-10-21 DIAGNOSIS — Z5111 Encounter for antineoplastic chemotherapy: Secondary | ICD-10-CM | POA: Diagnosis not present

## 2020-10-28 ENCOUNTER — Encounter
Payer: Medicare Other | Attending: Physical Medicine and Rehabilitation | Admitting: Physical Medicine and Rehabilitation

## 2020-10-28 ENCOUNTER — Other Ambulatory Visit: Payer: Self-pay

## 2020-10-28 ENCOUNTER — Encounter: Payer: Self-pay | Admitting: Physical Medicine and Rehabilitation

## 2020-10-28 VITALS — BP 136/85 | HR 76 | Temp 98.5°F | Ht 69.0 in | Wt 235.6 lb

## 2020-10-28 DIAGNOSIS — M7918 Myalgia, other site: Secondary | ICD-10-CM | POA: Diagnosis not present

## 2020-10-28 DIAGNOSIS — S22000A Wedge compression fracture of unspecified thoracic vertebra, initial encounter for closed fracture: Secondary | ICD-10-CM | POA: Insufficient documentation

## 2020-10-28 DIAGNOSIS — R8279 Other abnormal findings on microbiological examination of urine: Secondary | ICD-10-CM | POA: Diagnosis not present

## 2020-10-28 DIAGNOSIS — Z5181 Encounter for therapeutic drug level monitoring: Secondary | ICD-10-CM | POA: Diagnosis not present

## 2020-10-28 DIAGNOSIS — Z79891 Long term (current) use of opiate analgesic: Secondary | ICD-10-CM | POA: Diagnosis not present

## 2020-10-28 DIAGNOSIS — G894 Chronic pain syndrome: Secondary | ICD-10-CM | POA: Diagnosis not present

## 2020-10-28 DIAGNOSIS — R269 Unspecified abnormalities of gait and mobility: Secondary | ICD-10-CM | POA: Diagnosis not present

## 2020-10-28 DIAGNOSIS — C674 Malignant neoplasm of posterior wall of bladder: Secondary | ICD-10-CM | POA: Diagnosis not present

## 2020-10-28 DIAGNOSIS — G8921 Chronic pain due to trauma: Secondary | ICD-10-CM | POA: Diagnosis not present

## 2020-10-28 MED ORDER — TRAMADOL HCL 50 MG PO TABS: 50.0000 mg | ORAL_TABLET | Freq: Four times a day (QID) | ORAL | 5 refills | Status: DC | PRN

## 2020-10-28 NOTE — Patient Instructions (Signed)
Plan: 1. Will send in more refills on Tramadol 50 mg q6 hours prn- #120 with 5 refills; , since will expire in Early July 2022.  Even though has 3 refills left, won't be able to refill after 11/30/20.   2.  Patient here for trigger point injections for  Consent done and on chart.  Cleaned areas with alcohol and injected using a 27 gauge 1.5 inch needle  Injected 6cc Using 1% Lidocaine with no EPI  Upper traps B/L Levators B/L Posterior scalenes L only Middle scalenes Splenius Capitus Pectoralis Major Rhomboids B/L x2 Infraspinatus Teres Major/minor L only Thoracic paraspinals B/L Lumbar paraspinals and sacral B/L x2 Other injections- L side ~T 6 level between L chest and Teres.    Patient's level of pain prior was 4/10 and very TTP- 6/8/10with standing.  Current level of pain after injections is 0/10  There was no bleeding or complications.  Patient was advised to drink a lot of water on day after injections to flush system Will have increased soreness for 12-48 hours after injections.  Can use Lidocaine patches the day AFTER injections Can use theracane on day of injections in places didn't inject Can use heating pad 4-6 hours AFTER injections  3. F/U 6 weeks for TrP injections.   4. Bladder irrigation for cancer vs treating UTI? today
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
## 2020-11-01 LAB — TOXASSURE SELECT,+ANTIDEPR,UR

## 2020-11-04 ENCOUNTER — Telehealth: Payer: Self-pay | Admitting: *Deleted

## 2020-11-04 DIAGNOSIS — Z5111 Encounter for antineoplastic chemotherapy: Secondary | ICD-10-CM | POA: Diagnosis not present

## 2020-11-04 DIAGNOSIS — C674 Malignant neoplasm of posterior wall of bladder: Secondary | ICD-10-CM | POA: Diagnosis not present

## 2020-11-04 NOTE — Telephone Encounter (Signed)
Urine drug screen for this encounter is consistent for prescribed medication. It is also positive for alcohol.

## 2020-11-05 ENCOUNTER — Other Ambulatory Visit: Payer: Self-pay

## 2020-11-05 ENCOUNTER — Encounter (HOSPITAL_BASED_OUTPATIENT_CLINIC_OR_DEPARTMENT_OTHER): Payer: Self-pay

## 2020-11-05 ENCOUNTER — Emergency Department (HOSPITAL_BASED_OUTPATIENT_CLINIC_OR_DEPARTMENT_OTHER): Payer: No Typology Code available for payment source

## 2020-11-05 DIAGNOSIS — Z8551 Personal history of malignant neoplasm of bladder: Secondary | ICD-10-CM | POA: Insufficient documentation

## 2020-11-05 DIAGNOSIS — D72829 Elevated white blood cell count, unspecified: Secondary | ICD-10-CM | POA: Diagnosis not present

## 2020-11-05 DIAGNOSIS — S0990XA Unspecified injury of head, initial encounter: Secondary | ICD-10-CM | POA: Diagnosis not present

## 2020-11-05 DIAGNOSIS — M542 Cervicalgia: Secondary | ICD-10-CM | POA: Diagnosis not present

## 2020-11-05 DIAGNOSIS — Z87891 Personal history of nicotine dependence: Secondary | ICD-10-CM | POA: Diagnosis not present

## 2020-11-05 DIAGNOSIS — S0081XA Abrasion of other part of head, initial encounter: Secondary | ICD-10-CM | POA: Diagnosis not present

## 2020-11-05 DIAGNOSIS — Y92007 Garden or yard of unspecified non-institutional (private) residence as the place of occurrence of the external cause: Secondary | ICD-10-CM | POA: Diagnosis not present

## 2020-11-05 DIAGNOSIS — E119 Type 2 diabetes mellitus without complications: Secondary | ICD-10-CM | POA: Diagnosis not present

## 2020-11-05 DIAGNOSIS — S60311A Abrasion of right thumb, initial encounter: Secondary | ICD-10-CM | POA: Diagnosis not present

## 2020-11-05 DIAGNOSIS — Z85118 Personal history of other malignant neoplasm of bronchus and lung: Secondary | ICD-10-CM | POA: Diagnosis not present

## 2020-11-05 DIAGNOSIS — Z79899 Other long term (current) drug therapy: Secondary | ICD-10-CM | POA: Diagnosis not present

## 2020-11-05 DIAGNOSIS — Y9389 Activity, other specified: Secondary | ICD-10-CM | POA: Insufficient documentation

## 2020-11-05 DIAGNOSIS — Z7982 Long term (current) use of aspirin: Secondary | ICD-10-CM | POA: Insufficient documentation

## 2020-11-05 DIAGNOSIS — H05222 Edema of left orbit: Secondary | ICD-10-CM | POA: Diagnosis not present

## 2020-11-05 DIAGNOSIS — Z96652 Presence of left artificial knee joint: Secondary | ICD-10-CM | POA: Diagnosis not present

## 2020-11-05 DIAGNOSIS — W19XXXA Unspecified fall, initial encounter: Secondary | ICD-10-CM | POA: Insufficient documentation

## 2020-11-05 DIAGNOSIS — Z043 Encounter for examination and observation following other accident: Secondary | ICD-10-CM | POA: Diagnosis not present

## 2020-11-05 NOTE — ED Triage Notes (Addendum)
Pt was outside working on the yard when he fell onto the sidewalk while standing up after fixing a sprinkler. Pt has a left eyebrow laceration, left cheek laceration, left swollen eye, and right thumb pain. Pt denies being on blood thinners. Pt denies N/V. AAO x 4.   Pt states he is supposed to use a walker.

## 2020-11-06 ENCOUNTER — Emergency Department (HOSPITAL_BASED_OUTPATIENT_CLINIC_OR_DEPARTMENT_OTHER): Payer: No Typology Code available for payment source

## 2020-11-06 ENCOUNTER — Emergency Department (HOSPITAL_BASED_OUTPATIENT_CLINIC_OR_DEPARTMENT_OTHER)
Admission: EM | Admit: 2020-11-06 | Discharge: 2020-11-06 | Disposition: A | Discharge status: Home / Self Care | Payer: No Typology Code available for payment source | Attending: Emergency Medicine | Admitting: Emergency Medicine

## 2020-11-06 DIAGNOSIS — W19XXXA Unspecified fall, initial encounter: Secondary | ICD-10-CM

## 2020-11-06 DIAGNOSIS — Z043 Encounter for examination and observation following other accident: Secondary | ICD-10-CM | POA: Diagnosis not present

## 2020-11-06 DIAGNOSIS — M4802 Spinal stenosis, cervical region: Secondary | ICD-10-CM | POA: Diagnosis not present

## 2020-11-06 DIAGNOSIS — S60931A Unspecified superficial injury of right thumb, initial encounter: Secondary | ICD-10-CM | POA: Diagnosis not present

## 2020-11-06 DIAGNOSIS — S0081XA Abrasion of other part of head, initial encounter: Secondary | ICD-10-CM

## 2020-11-06 DIAGNOSIS — M19041 Primary osteoarthritis, right hand: Secondary | ICD-10-CM | POA: Diagnosis not present

## 2020-11-06 LAB — CBC WITH DIFFERENTIAL/PLATELET
Abs Immature Granulocytes: 0.03 10*3/uL (ref 0.00–0.07)
Basophils Absolute: 0.1 10*3/uL (ref 0.0–0.1)
Basophils Relative: 1 %
Eosinophils Absolute: 0.3 10*3/uL (ref 0.0–0.5)
Eosinophils Relative: 3 %
HCT: 41.3 % (ref 39.0–52.0)
Hemoglobin: 14 g/dL (ref 13.0–17.0)
Immature Granulocytes: 0 %
Lymphocytes Relative: 21 %
Lymphs Abs: 2.2 10*3/uL (ref 0.7–4.0)
MCH: 30.6 pg (ref 26.0–34.0)
MCHC: 33.9 g/dL (ref 30.0–36.0)
MCV: 90.4 fL (ref 80.0–100.0)
Monocytes Absolute: 1 10*3/uL (ref 0.1–1.0)
Monocytes Relative: 10 %
Neutro Abs: 6.7 10*3/uL (ref 1.7–7.7)
Neutrophils Relative %: 65 %
Platelets: 172 10*3/uL (ref 150–400)
RBC: 4.57 MIL/uL (ref 4.22–5.81)
RDW: 14.3 % (ref 11.5–15.5)
WBC: 10.3 10*3/uL (ref 4.0–10.5)
nRBC: 0 % (ref 0.0–0.2)

## 2020-11-06 LAB — BASIC METABOLIC PANEL
Anion gap: 10 (ref 5–15)
BUN: 21 mg/dL (ref 8–23)
CO2: 27 mmol/L (ref 22–32)
Calcium: 9 mg/dL (ref 8.9–10.3)
Chloride: 101 mmol/L (ref 98–111)
Creatinine, Ser: 1.21 mg/dL (ref 0.61–1.24)
GFR, Estimated: >60 mL/min (ref >60)
Glucose, Bld: 164 mg/dL — ABNORMAL HIGH (ref 70–99)
Potassium: 3.7 mmol/L (ref 3.5–5.1)
Sodium: 138 mmol/L (ref 135–145)

## 2020-11-06 LAB — URINALYSIS, ROUTINE W REFLEX MICROSCOPIC
Bilirubin Urine: NEGATIVE
Glucose, UA: NEGATIVE mg/dL
Ketones, ur: NEGATIVE mg/dL
Nitrite: NEGATIVE
Protein, ur: >300 mg/dL — AB
RBC / HPF: >50 RBC/hpf — ABNORMAL HIGH (ref 0–5)
Specific Gravity, Urine: 1.025 (ref 1.005–1.030)
Trans Epithel, UA: 1
WBC, UA: >50 WBC/hpf — ABNORMAL HIGH (ref 0–5)
pH: 6 (ref 5.0–8.0)

## 2020-11-06 MED ORDER — ACETAMINOPHEN 325 MG PO TABS
650.0000 mg | ORAL_TABLET | Freq: Once | ORAL | Status: AC
  Administered 2020-11-06: 02:00:00 650 mg via ORAL
  Filled 2020-11-06: qty 2

## 2020-11-06 MED ORDER — TRAMADOL HCL 50 MG PO TABS
50.0000 mg | ORAL_TABLET | Freq: Once | ORAL | Status: AC
  Administered 2020-11-06: 02:00:00 50 mg via ORAL
  Filled 2020-11-06: qty 1
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
## 2020-11-06 NOTE — Discharge Instructions (Signed)
Follow-up with your primary care doctor.  Return to ER if you have any additional falls or episodes of passing out or develop other new concerning symptom.

## 2020-11-20 ENCOUNTER — Other Ambulatory Visit: Payer: Self-pay | Admitting: Physician Assistant

## 2020-12-14 ENCOUNTER — Other Ambulatory Visit: Payer: Self-pay

## 2020-12-14 ENCOUNTER — Encounter: Payer: Self-pay | Admitting: Physical Medicine and Rehabilitation

## 2020-12-14 ENCOUNTER — Encounter
Payer: Medicare Other | Attending: Physical Medicine and Rehabilitation | Admitting: Physical Medicine and Rehabilitation

## 2020-12-14 VITALS — BP 147/90 | HR 78 | Temp 98.2°F | Ht 69.0 in | Wt 242.2 lb

## 2020-12-14 DIAGNOSIS — M7918 Myalgia, other site: Secondary | ICD-10-CM | POA: Insufficient documentation

## 2020-12-14 DIAGNOSIS — R269 Unspecified abnormalities of gait and mobility: Secondary | ICD-10-CM | POA: Diagnosis not present

## 2020-12-14 DIAGNOSIS — G8921 Chronic pain due to trauma: Secondary | ICD-10-CM | POA: Diagnosis not present

## 2020-12-14 DIAGNOSIS — R1031 Right lower quadrant pain: Secondary | ICD-10-CM | POA: Insufficient documentation

## 2020-12-14 MED ORDER — TRAMADOL HCL 50 MG PO TABS: 50.0000 mg | ORAL_TABLET | Freq: Four times a day (QID) | ORAL | 5 refills | Status: DC | PRN

## 2020-12-14 NOTE — Patient Instructions (Signed)
Plan:  No more than 1 shot, or 4 oz of wine or 12 oz of beer in 24 hours period- esp when taking pain meds.   2. Patient here for trigger point injections for  Consent done and on chart.  Cleaned areas with alcohol and injected using a 27 gauge 1.5 inch needle  Injected 4cc Using 1% Lidocaine with no EPI  Upper traps- B/L  Levators- B/L Posterior scalenes Middle scalenes- B/L Splenius Capitus Pectoralis Major Rhomboids B/L Infraspinatus Teres Major/minor Thoracic paraspinals B/L x2 Lumbar paraspinals Other injections-    Patient's level of pain prior was 2/10 Current level of pain after injections is 0/10  There was no bleeding or complications.  Patient was advised to drink a lot of water on day after injections to flush system Will have increased soreness for 12-48 hours after injections.  Can use Lidocaine patches the day AFTER injections Can use theracane on day of injections in places didn't inject Can use heating pad 4-6 hours AFTER injections  3. NEEDS TO USE ROLLING WALKER WHENEVER HE'S MOVING.   4. Needs tramadol refills- not because used all of them- only used 2- however pharmacy expired meds on 11/30/20. _ lst refil actually 4/21 (not 4/16 like put in ). 50- mg q6 hours prn- #120 5 refills- usually takes 2x/day.   5. Going to start back to pool therapy next week  6. Rotate R>L hip inwards first and then slowly pull outwards- rotate the hip OUT- not pull leg outwards- hold for at least 2 minutes- and do daily!  7. F/U in 2 months? 

## 2020-12-14 NOTE — Progress Notes (Signed)
  Pt is a 70 yr old male with hx of 2 Thoracic compression fractures as well as bladder cancer in treatment- recurrent as of 07/26/20- Here for f/u and trigger point injections.    Moving around a little better.  No fishing lately. Piddling around the house.   Sees Urology Friday- went through Chemo wash and sees them for f/u on Friday- thinks will do cystoscopy then to let him know how it's going.   Sleeping too much-  sleeping 10-12 hours/day- when sits down, takes a nap.  Sleeping good now (had been bad for awhile).  Was due to "mind turning on"- but is better.    [place] in the driveway on 8/27- 0/78- not sure if passed out or now- wife heard him go out the door- was laying face down in [place] on cheek and ruined eyeglasses- - got dizzy. No fractures.  Face took brunt of fall. Hit R frontal area.  Wasn't using RW when fell over.     Pain in back didn't get worse after fall. Past the first few days.         Takes tramadol 1 pill 2x/day- at most- doesn't take as much as prescribed.   Plan:  No more than 1 shot, or 4 oz of wine or 12 oz of beer in 24 hours period- esp when taking pain meds.   2. Patient here for trigger point injections for  Consent done and on chart.  Cleaned areas with alcohol and injected using a 27 gauge 1.5 inch needle  Injected 4cc Using 1% Lidocaine with no EPI  Upper traps- B/L  Levators- B/L Posterior scalenes Middle scalenes- B/L Splenius Capitus Pectoralis Major Rhomboids B/L Infraspinatus Teres Major/minor Thoracic paraspinals B/L x2 Lumbar paraspinals Other injections-    Patient's level of pain prior was 2/10 Current level of pain after injections is 0/10  There was no bleeding or complications.  Patient was advised to drink a lot of water on day after injections to flush system Will have increased soreness for 12-48 hours after injections.  Can use Lidocaine patches the day AFTER injections Can use theracane on  day of injections in places didn't inject Can use heating pad 4-6 hours AFTER injections  3. NEEDS TO USE ROLLING WALKER WHENEVER HE'S MOVING.   4. Needs tramadol refills- not because used all of them- only used 2- however pharmacy expired meds on 11/30/20. _ lst refill actually 4/21 (not 4/16 like put in ). 50- mg q6 hours prn- #120 5 refills- usually takes 2x/day.   5. Going to start back to pool therapy next week  6. Rotate R>L hip inwards first and then slowly pull outwards- rotate the hip OUT- not pull leg outwards- hold for at least 2 minutes- and do daily!  7. F/U in 2 months- wait list and 3 months   I spent a total  of 30 minutes- 10 minutes doing injections- as detailed above- esp showing exercises and discussing pain issues.

## 2020-12-16 DIAGNOSIS — C674 Malignant neoplasm of posterior wall of bladder: Secondary | ICD-10-CM | POA: Diagnosis not present

## 2021-03-01 ENCOUNTER — Encounter
Payer: Medicare Other | Attending: Physical Medicine and Rehabilitation | Admitting: Physical Medicine and Rehabilitation

## 2021-03-01 ENCOUNTER — Encounter: Payer: Self-pay | Admitting: Physical Medicine and Rehabilitation

## 2021-03-01 ENCOUNTER — Other Ambulatory Visit: Payer: Self-pay

## 2021-03-01 VITALS — BP 147/92 | HR 88 | Temp 98.9°F | Ht 69.0 in | Wt 239.0 lb

## 2021-03-01 DIAGNOSIS — G8921 Chronic pain due to trauma: Secondary | ICD-10-CM | POA: Insufficient documentation

## 2021-03-01 DIAGNOSIS — M4716 Other spondylosis with myelopathy, lumbar region: Secondary | ICD-10-CM | POA: Insufficient documentation

## 2021-03-01 DIAGNOSIS — G959 Disease of spinal cord, unspecified: Secondary | ICD-10-CM | POA: Diagnosis not present

## 2021-03-01 DIAGNOSIS — R269 Unspecified abnormalities of gait and mobility: Secondary | ICD-10-CM | POA: Diagnosis not present

## 2021-03-01 MED ORDER — TRAZODONE HCL 50 MG PO TABS: 50.0000 mg | ORAL_TABLET | Freq: Every day | ORAL | 5 refills | Status: DC

## 2021-03-01 NOTE — Progress Notes (Signed)
Pt is a 70 yr old male with hx of 2 Thoracic compression fractures as well as bladder cancer in treatment- recurrent as of 07/26/20- Here for f/u on compression fx's and pain and trigger point injections.    Sleeping more-  No use of RLE- fallen numerous times-   Upper part of body gets "in front of lower part of body"- and dragging R leg esp with gait.   Feeling worse overall- more tired and more pain.  Developed a HA last time at [employer] office.  Hasn't gone away yet.  Just had cataract surgery on both eyes.  So had HA 2-4 weeks.    Is R temple and goes around to back of head- starting to go around to back of L side from temple as well.    Trouble sleeping at night- takes Melatonin 3 mg Nightly- Pees OK-    Exam: Awake, bu t very sleepy; accompanied by wife; using rollator; falling asleep on table, NAD RLE- HF 3-/5, KE 5-/5, DF and PF 5-/5 LLE- 5/5 in HF/KE/DF and PF    Plan: 1. Insomnia- with daytime sleepiness- Will try Trazodone 50-100 mg nightly- for sleep- if has hangover the next morning, take it earlier, like dinner time. Some patients take 25 mg nightly-   2. If Headache doesn't get better- in next 2 weeks, can try to change to Ampitripyline- but makes patients more dry, so will wait. Call me and let me know.   3. MRI of lumbar spine due to NEW RLE weakness- HF 3-/5 which is strikingly weak- has hx of bladder cancer- concern for DDD or cancer?  4. Doesn't want injections today for trigger point injections.    5. Doesn't need refill on Tramadol- last filled 7/20- needs to pick up new refill, but has it already.   6.  If he gets worse neurologically and c/o Headache, would need another head CT- easiest to get via ER.    7. F/U in 3 months-    I spent a total of 31 minutes on visit- discussing HA, concern for sedation, and MRI of lumbar spine due to RLE weakness.

## 2021-03-01 NOTE — Patient Instructions (Signed)
Plan: 1. Insomnia- with daytime sleepiness- Will try Trazodone 50-100 mg nightly- for sleep- if has hangover the next morning, take it earlier, like dinner time. Some patients take 25 mg nightly-   2. If Headache doesn't get better- in next 2 weeks, can try to change to Ampitripyline- but makes patients more dry, so will wait. Call me and let me know.   3. MRI of lumbar spine due to NEW RLE weakness- HF 3-/5 which is strikingly weak- has hx of bladder cancer- concern for DDD or cancer?  4. Doesn't want injections today for trigger point injections.    5. Doesn't need refill on Tramadol- last filled 7/20- needs to pick up new refill, but has it already.   6.  If he gets worse neurologically and c/o Headache, would need another head CT- easiest to get via ER.    7. F/U in 3 months-

## 2021-03-07 ENCOUNTER — Other Ambulatory Visit: Payer: Self-pay

## 2021-03-07 ENCOUNTER — Ambulatory Visit
Admission: RE | Admit: 2021-03-07 | Discharge: 2021-03-07 | Disposition: A | Discharge status: Home / Self Care | Payer: Medicare Other | Source: Ambulatory Visit | Attending: Physical Medicine and Rehabilitation | Admitting: Physical Medicine and Rehabilitation

## 2021-03-07 DIAGNOSIS — M4716 Other spondylosis with myelopathy, lumbar region: Secondary | ICD-10-CM

## 2021-03-07 DIAGNOSIS — G959 Disease of spinal cord, unspecified: Secondary | ICD-10-CM

## 2021-03-07 DIAGNOSIS — M48061 Spinal stenosis, lumbar region without neurogenic claudication: Secondary | ICD-10-CM | POA: Diagnosis not present

## 2021-03-13 ENCOUNTER — Telehealth: Payer: Self-pay

## 2021-03-13 NOTE — Telephone Encounter (Signed)
Mrs. Marmolejos called for recent imaging report. Call back phone [phone].

## 2021-03-13 NOTE — Telephone Encounter (Signed)
Called wife - he's also confused now in addition to HA- I exlained he could have UTI- is seeing Urology Friday, but also needs a head CT.  I explained the disc issue I found on MRI doesn't explain his R hip weakness at all- so will also need to see Neurology for R hip weakness as well-  Don't have a good reason- gave her my contact info to let me know when knows what's going on.

## 2021-03-14 DIAGNOSIS — R300 Dysuria: Secondary | ICD-10-CM | POA: Diagnosis not present

## 2021-03-17 DIAGNOSIS — C674 Malignant neoplasm of posterior wall of bladder: Secondary | ICD-10-CM | POA: Diagnosis not present

## 2021-04-12 ENCOUNTER — Other Ambulatory Visit: Payer: Self-pay | Admitting: Physician Assistant

## 2021-05-31 ENCOUNTER — Encounter: Payer: Medicare Other | Admitting: Physical Medicine and Rehabilitation

## 2021-06-14 DIAGNOSIS — C674 Malignant neoplasm of posterior wall of bladder: Secondary | ICD-10-CM | POA: Diagnosis not present

## 2021-06-14 DIAGNOSIS — R350 Frequency of micturition: Secondary | ICD-10-CM | POA: Diagnosis not present

## 2021-09-09 ENCOUNTER — Other Ambulatory Visit: Payer: Self-pay | Admitting: Physician Assistant

## 2021-09-13 DIAGNOSIS — C674 Malignant neoplasm of posterior wall of bladder: Secondary | ICD-10-CM | POA: Diagnosis not present

## 2021-09-13 DIAGNOSIS — R350 Frequency of micturition: Secondary | ICD-10-CM | POA: Diagnosis not present

## 2021-12-13 DIAGNOSIS — C674 Malignant neoplasm of posterior wall of bladder: Secondary | ICD-10-CM | POA: Diagnosis not present

## 2022-02-20 ENCOUNTER — Ambulatory Visit
Admission: EM | Admit: 2022-02-20 | Discharge: 2022-02-20 | Disposition: A | Discharge status: Home / Self Care | Payer: No Typology Code available for payment source | Attending: Physician Assistant | Admitting: Physician Assistant

## 2022-02-20 DIAGNOSIS — H1032 Unspecified acute conjunctivitis, left eye: Secondary | ICD-10-CM

## 2022-02-20 MED ORDER — POLYMYXIN B-TRIMETHOPRIM 10000-0.1 UNIT/ML-% OP SOLN: 1.0000 [drp] | OPHTHALMIC | 0 refills | Status: AC

## 2022-02-20 NOTE — ED Triage Notes (Signed)
Pt presents to [facility] with co of L eye pain and redness for one week, pt has been using otc medications for symptoms tylenol and eye drops with minimal improvement.
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
## 2022-03-14 DIAGNOSIS — C674 Malignant neoplasm of posterior wall of bladder: Secondary | ICD-10-CM | POA: Diagnosis not present

## 2022-12-31 ENCOUNTER — Encounter: Payer: Self-pay | Admitting: Diagnostic Neuroimaging

## 2022-12-31 ENCOUNTER — Ambulatory Visit: Payer: Medicare Other | Admitting: Diagnostic Neuroimaging

## 2022-12-31 VITALS — BP 136/99 | HR 80 | Ht 69.0 in | Wt 197.0 lb

## 2022-12-31 DIAGNOSIS — H532 Diplopia: Secondary | ICD-10-CM | POA: Diagnosis not present

## 2022-12-31 DIAGNOSIS — F03A0 Unspecified dementia, mild, without behavioral disturbance, psychotic disturbance, mood disturbance, and anxiety: Secondary | ICD-10-CM | POA: Diagnosis not present

## 2022-12-31 DIAGNOSIS — M6281 Muscle weakness (generalized): Secondary | ICD-10-CM

## 2022-12-31 NOTE — Patient Instructions (Signed)
  MILD DEMENTIA / MEMORY LOSS (since ~2022; MMSE 19/30; short term memory loss; decline in ADLs; also limitations due to legs, balance, pain) - check MRI brain - continue donepezil  - safety / supervision issues reviewed - daily physical activity / exercise (at least 15-30 minutes) - eat more plants / vegetables - increase social activities, brain stimulation, games, puzzles, hobbies, crafts, arts, music - aim for at least 7-8 hours sleep per night (or more) - avoid smoking and alcohol - caregiver resources provided - cannot handle medications, finances; no driving  INTERMITTENT DOUBLE VISION, WEAKNESS - check labs  NECK PAIN / BACK PAIN - follow up with PT and PCP; continue supportive care
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
## 2023-01-01 ENCOUNTER — Telehealth: Payer: Self-pay | Admitting: Diagnostic Neuroimaging

## 2023-01-01 NOTE — Telephone Encounter (Signed)
[employer] medicare NPR sent to GI [phone] 

## 2023-01-13 LAB — ACHR ABS WITH REFLEX TO MUSK: AChR Binding Ab, Serum: <0.03 nmol/L (ref 0.00–0.24)

## 2023-01-17 ENCOUNTER — Telehealth: Payer: Self-pay | Admitting: Anesthesiology

## 2023-01-17 NOTE — Telephone Encounter (Signed)
Pt's wife was informed of normal lab results. Pt's wife had no questions. Advised to call back if any questions arise.

## 2023-01-17 NOTE — Telephone Encounter (Signed)
-----   Message from Suanne Marker sent at 01/16/2023  5:08 PM EDT ----- Normal labs. Please call patient. -VRP

## 2023-02-19 ENCOUNTER — Other Ambulatory Visit: Payer: No Typology Code available for payment source

## 2023-09-09 ENCOUNTER — Encounter: Payer: Self-pay | Admitting: Emergency Medicine

## 2023-09-09 ENCOUNTER — Ambulatory Visit: Admission: EM | Admit: 2023-09-09 | Discharge: 2023-09-09 | Disposition: A | Discharge status: Home / Self Care

## 2023-09-09 DIAGNOSIS — J0100 Acute maxillary sinusitis, unspecified: Secondary | ICD-10-CM | POA: Diagnosis not present

## 2023-09-09 MED ORDER — DOXYCYCLINE HYCLATE 100 MG PO CAPS: 100.0000 mg | ORAL_CAPSULE | Freq: Two times a day (BID) | ORAL | 0 refills | Status: DC

## 2023-09-09 MED ORDER — PREDNISONE 10 MG (21) PO TBPK: ORAL_TABLET | Freq: Every day | ORAL | 0 refills | Status: DC
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
## 2023-09-09 NOTE — ED Triage Notes (Signed)
 Pt presents with with he believes is a sinus infection sxs x 1 week.

## 2023-09-09 NOTE — Discharge Instructions (Signed)
 Begin doxycycline twice daily for 10 days to clear germs contributing to symptoms  Begin prednisone every morning with food as directed to help reduce sinus pressure    You can take Tylenol and/or Ibuprofen as needed for fever reduction and pain relief.   For cough: honey 1/2 to 1 teaspoon (you can dilute the honey in water or another fluid).  You can also use guaifenesin and dextromethorphan for cough. You can use a humidifier for chest congestion and cough.  If you don't have a humidifier, you can sit in the bathroom with the hot shower running.      For sore throat: try warm salt water gargles, cepacol lozenges, throat spray, warm tea or water with lemon/honey, popsicles or ice, or OTC cold relief medicine for throat discomfort.   For congestion: take a daily anti-histamine like Zyrtec, Claritin, and a oral decongestant, such as pseudoephedrine.  You can also use Flonase 1-2 sprays in each nostril daily.   It is important to stay hydrated: drink plenty of fluids (water, gatorade/powerade/pedialyte, juices, or teas) to keep your throat moisturized and help further relieve irritation/discomfort.

## 2023-09-23 ENCOUNTER — Encounter: Payer: Self-pay | Admitting: Allergy & Immunology

## 2023-09-23 ENCOUNTER — Other Ambulatory Visit: Payer: Self-pay

## 2023-09-23 ENCOUNTER — Ambulatory Visit (INDEPENDENT_AMBULATORY_CARE_PROVIDER_SITE_OTHER): Admitting: Allergy & Immunology

## 2023-09-23 VITALS — BP 126/80 | HR 68 | Temp 97.7°F | Resp 19 | Ht 65.0 in | Wt 205.5 lb

## 2023-09-23 DIAGNOSIS — R21 Rash and other nonspecific skin eruption: Secondary | ICD-10-CM

## 2023-09-23 DIAGNOSIS — J4520 Mild intermittent asthma, uncomplicated: Secondary | ICD-10-CM

## 2023-09-23 DIAGNOSIS — K529 Noninfective gastroenteritis and colitis, unspecified: Secondary | ICD-10-CM | POA: Diagnosis not present

## 2023-09-23 MED ORDER — TRIAMCINOLONE ACETONIDE 0.1 % EX OINT: 1.0000 | TOPICAL_OINTMENT | Freq: Two times a day (BID) | CUTANEOUS | 0 refills | Status: DC

## 2023-09-23 NOTE — Progress Notes (Unsigned)
 NEW PATIENT  Date of Service/Encounter:  09/23/23  Consult requested by: Jenell Mirza, MD   Assessment:   Chronic diarrhea - with positive alpha gal lab (unclear what the exact result was since we do not have access to the [place] records)   Patient at [facility] - has extensive report on gut microbiome  Rash  Plan/Recommendations:   1. Chronic diarrhea - We are going to get a repeat alpha gal to see where this level is.  - We are also going to get a tryptase to look for a mast cell disorder.  - We will get some testing to the most common foods as well.   2. Rash - Add on a topical steroid to help with the rash (triamcinolone twice daily).  3. Rhinorrhea  - Add on ipratropium one spray per nostril twice daily as needed (AIM FOR THE EARS). - This can be over drying, so beware of that.   4. Return in about 6 months (around 03/24/2024). You can have the follow up appointment with Dr. Idolina Maker or a Nurse Practicioner (our Nurse Practitioners are excellent and always have Physician oversight!). We may do it earlier if the labs warrant that.    This note in its entirety was forwarded to the Provider who requested this consultation.  Subjective:   Harold Morris is a 73 y.o. male presenting today for evaluation of  Chief Complaint  Patient presents with   Establish Care    Went to [employer] in [place]. Still having diarrhea especially in the morning right after eating. Have taken out all beef out of his diet. [facility] stated that he had Alpha Gal.     Carletha Check has a history of the following: Patient Active Problem List   Diagnosis Date Noted   Lumbar spondylosis with myelopathy 03/01/2021   Severe rt groin pain 08/05/2020   Impaired gait 08/05/2020   Malignant neoplasm of urinary bladder (HCC) 01/18/2020   Chronic pain due to trauma 08/24/2019   Chronic sacroiliac joint pain 06/04/2019   Sensory loss 05/08/2019   Compression fracture of body of thoracic  vertebra (HCC) 04/01/2019   Myofascial pain 04/01/2019   Cauda equina compression (HCC) 02/17/2019   Neurogenic bowel 02/17/2019   Neurogenic bladder 02/17/2019   Acute midline thoracic back pain 02/17/2019   Chronic diarrhea 02/18/2015   Hip pain 01/18/2015   Acute pulmonary embolism (HCC) 01/01/2015   DM type 2 (diabetes mellitus, type 2) (HCC) 01/01/2015   Gout 01/01/2015   History of lung cancer 01/01/2015    History obtained from: chart review and patient and his wife.  Discussed the use of AI scribe software for clinical note transcription with the patient and/or guardian, who gave verbal consent to proceed.  Carletha Check was referred by Jenell Mirza, MD.     Harold Morris is a 73 y.o. male presenting for an evaluation of chronic diarrhea . Most of the history is provided by his wife, since he does not hear very well at all.  He has experienced chronic diarrhea for years, which he associates with his return from [place]. The diarrhea primarily occurs in the mornings or within 30 minutes after eating, leading to frequent accidents and the need to stay home after meals. Despite dietary changes, including avoiding red meat, his symptoms persist. He has undergone multiple stool sample tests, with the most recent one indicating alpha-gal syndrome.  However, his diarrhea has been going on for years, decades.  It has gotten  worse in the last 2 to 3 years.  He recalls being bitten by ticks, which he humorously notes would die after biting him, especially following his chemotherapy for lung cancer. He was diagnosed with lung cancer, for which he underwent surgery to remove the bottom lobe of his lung, and has had bladder cancer twice, treated with chemo wash. He has a history of smoking for 30-35 years and was exposed to [employer] during his [employer] in [place].  Asthma/Respiratory Symptom History: He has been using an inhaler, specifically albuterol , for respiratory issues. He also reports  a history of sinus infections, with the most recent one occurring a few weeks ago.  He does not have a diagnosis of asthma.  He mostly wheezes during respiratory infections.  Skin Symptom History: He experiences occasional itching and hives, with a recent episode of itching on his finger that lasted about ten minutes. No frequent occurrences of hives are reported.  He has been married for 52 years and has two children. 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 He is the youngest of 15 siblings, with four brothers also having served in the [employer]. His father left when he was [age], and his mother raised the family alone.   Otherwise, there is no history of other atopic diseases, including drug allergies, stinging insect allergies, or contact dermatitis. There is no significant infectious history. Vaccinations are up to date.    Past Medical History: Patient Active Problem List   Diagnosis Date Noted   Lumbar spondylosis with myelopathy 03/01/2021   Severe rt groin pain 08/05/2020   Impaired gait 08/05/2020   Malignant neoplasm of urinary bladder (HCC) 01/18/2020   Chronic pain due to trauma 08/24/2019   Chronic sacroiliac joint pain 06/04/2019   Sensory loss 05/08/2019   Compression fracture of body of thoracic vertebra (HCC) 04/01/2019   Myofascial pain 04/01/2019   Cauda equina compression (HCC) 02/17/2019   Neurogenic bowel 02/17/2019   Neurogenic bladder 02/17/2019   Acute midline thoracic back pain 02/17/2019   Chronic diarrhea 02/18/2015   Hip pain 01/18/2015   Acute pulmonary embolism (HCC) 01/01/2015   DM type 2 (diabetes mellitus, type 2) (HCC) 01/01/2015   Gout 01/01/2015   History of lung cancer 01/01/2015    Medication List:  Allergies as of 09/23/2023       Reactions   Adenosine Shortness Of Breath   Other reaction(s): Wheezing   Epinephrine Anaphylaxis, Other (See Comments), Shortness Of Breath   Other reaction(s): CONFUSION, Apnea,  CONFUSION, Apnea   Hydrocodone  Itching   High strength dosing causes reaction   Morphine  Other (See Comments)   hallucinations Other reaction(s): Hallucinations hallucinations Other reaction(s): Hallucinations   Celecoxib Other (See Comments)   Hydromorphone  Swelling   Other reaction(s): Lip swelling   Sulfa Antibiotics Other (See Comments)   unknown Other reaction(s): HIVES   Codeine    Other reaction(s): Hallucinations   Oxycodone  Hives   Oxycontin  [oxycodone  Hcl]    "I go crazy"   Penicillin G    Other reaction(s): Edema, SWELLING (NON-SPECIFIC)   Fenofibrate Other (See Comments)        Medication List        Accurate as of September 23, 2023 11:59 PM. If you have any questions, ask your nurse or doctor.          STOP taking these medications    atorvastatin 80 MG tablet Commonly known as: LIPITOR Stopped by: Quantrell Splitt Louis Jabari Swoveland   Calcium  Carb-Cholecalciferol 600-10 MG-MCG Tabs Stopped by: Rochester Chuck   clopidogrel 75 MG tablet Commonly known as: PLAVIX Stopped by: Sarha Bartelt Louis Avelardo Reesman   doxycycline  100 MG capsule Commonly known as: VIBRAMYCIN  Stopped by: Rochester Chuck   gabapentin  300 MG capsule Commonly known as: NEURONTIN  Stopped by: Rochester Chuck   ketoconazole 2 % cream Commonly known as: NIZORAL Stopped by: Natalyn Szymanowski Louis Ramisa Duman   lidocaine  5 % ointment Commonly known as: XYLOCAINE  Stopped by: Rochester Chuck   metFORMIN 500 MG 24 hr tablet Commonly known as: GLUCOPHAGE-XR Stopped by: Rochester Chuck   predniSONE  10 MG (21) Tbpk tablet Commonly known as: STERAPRED UNI-PAK 21 TAB Stopped by: Charnese Federici Louis Shellie Goettl   Semaglutide (1 MG/DOSE) 4 MG/3ML Sopn Stopped by: Rochester Chuck   traZODone  50 MG tablet Commonly known as: DESYREL  Stopped by: Rochester Chuck       TAKE these medications    acetaminophen  500 MG tablet Commonly known as: TYLENOL  Take by mouth.   allopurinol  300 MG  tablet Commonly known as: ZYLOPRIM  Take 300 mg by mouth daily.   ammonium lactate 12 % lotion Commonly known as: LAC-HYDRIN Apply topically.   aspirin EC 81 MG tablet Take by mouth.   Cyanocobalamin  1000 MCG Tbcr Take by mouth.   donepezil 10 MG tablet Commonly known as: ARICEPT Take 10 mg by mouth at bedtime.   hydrocortisone cream 1 % Apply 1 application topically as needed for itching.   IBUPROFEN PO Take 500 mg by mouth as needed. Pt gets this from [place]   mirabegron ER 25 MG Tb24 tablet Commonly known as: MYRBETRIQ Take 25 mg by mouth daily.   NP Thyroid 60 MG tablet Generic drug: thyroid Take 60 mg by mouth.   rosuvastatin 40 MG tablet Commonly known as: CRESTOR Take 40 mg by mouth at bedtime.   sertraline  100 MG tablet Commonly known as: ZOLOFT  Take 100 mg by mouth daily.   triamcinolone ointment 0.1 % Commonly known as: KENALOG Apply 1 Application topically 2 (two) times daily. Started by: Rochester Chuck   Vitamin D (Ergocalciferol) 1.25 MG (50000 UNIT) Caps capsule Commonly known as: DRISDOL Take by mouth.        Birth History: non-contributory  Developmental History: non-contributory  Past Surgical History: Past Surgical History:  Procedure Laterality Date   ADENOIDECTOMY     CYSTOSCOPY W/ RETROGRADES Bilateral 12/15/2019   Procedure: CYSTOSCOPY WITH RETROGRADE PYELOGRAM;  Surgeon: Sherlyn Ditto, MD;  Location: [facility];  Service: Urology;  Laterality: Bilateral;   CYSTOSCOPY W/ RETROGRADES Bilateral 07/26/2020   Procedure: CYSTOSCOPY WITH RETROGRADE PYELOGRAM;  Surgeon: Sherlyn Ditto, MD;  Location: [facility];  Service: Urology;  Laterality: Bilateral;   CYSTOSCOPY WITH INSERTION OF UROLIFT N/A 08/10/2019   Procedure: CYSTOSCOPY WITH INSERTION OF UROLIFT;  Surgeon: Marco Severs, MD;  Location: [facility];  Service: Urology;  Laterality: N/A;   CYSTOSCOPY WITH LITHOLAPAXY  N/A 08/10/2019   Procedure: CYSTOSCOPY WITH LITHOLAPAXY, BLADDER BIOPSY, FULGERATION;  Surgeon: Marco Severs, MD;  Location: [facility];  Service: Urology;  Laterality: N/A;  30 MINS   LAPAROSCOPIC CHOLECYSTECTOMY  2014 approx.   LUMBAR SPINE SURGERY  x2  last one 06/ 2014   diskectomy first sx and fusion second sx   LUNG LOBECTOMY Left 06/2009     [facility]   left lower lobe for cancer   NASAL SINUS SURGERY  age 71s   RECONSTRUCTION  OF NOSE  age 35   and multiple facial fractures   TONSILLECTOMY  age 53s   TOTAL KNEE ARTHROPLASTY Left 2000 approx.   TRANSURETHRAL RESECTION OF BLADDER TUMOR N/A 12/15/2019   Procedure: TRANSURETHRAL RESECTION OF BLADDER TUMOR (TURBT);  Surgeon: Sherlyn Ditto, MD;  Location: [facility];  Service: Urology;  Laterality: N/A;  30 MINS   TRANSURETHRAL RESECTION OF BLADDER TUMOR N/A 07/26/2020   Procedure: TRANSURETHRAL RESECTION OF BLADDER TUMOR (TURBT);  Surgeon: Sherlyn Ditto, MD;  Location[facility];  Service: Urology;  Laterality: N/A;  45 MINS   WRIST SURGERY Right yrs ago   removal bone fragment     Family History: Family History  Problem Relation Age of Onset   Diabetes Mother    Eczema Sister    Cancer Sister    Cancer Brother    Cancer - Prostate Brother    Asthma Son      Social History: Corbit lives at home with his family.  They live in a house that is 75 years old.  There is hardwood throughout the home.  They have oil heating and central cooling.  There is a dog inside of the home.  There are dust mite covers on the bed and the pillows.  There is no tobacco exposure.  He is currently retired.  There is no fume, chemical, or dust exposure.   Review of systems otherwise negative other than that mentioned in the HPI.    Objective:   Blood pressure 126/80, pulse 68, temperature 97.7 F (36.5 C), temperature source Temporal, resp. rate 19, height 5\' 5"  (1.651 m), weight 205  lb 8 oz (93.2 kg), SpO2 97%. Body mass index is 34.2 kg/m.     Physical Exam Vitals reviewed.  Constitutional:      Appearance: He is well-developed.     Comments: Pleasant.  Cooperative with the exam.  HENT:     Head: Normocephalic and atraumatic.     Right Ear: Tympanic membrane, ear canal and external ear normal. No drainage, swelling or tenderness. Tympanic membrane is not injected, scarred, erythematous, retracted or bulging.     Left Ear: Tympanic membrane, ear canal and external ear normal. No drainage, swelling or tenderness. Tympanic membrane is not injected, scarred, erythematous, retracted or bulging.     Nose: No nasal deformity, septal deviation, mucosal edema or rhinorrhea.     Right Turbinates: Enlarged, swollen and pale.     Left Turbinates: Enlarged, swollen and pale.     Right Sinus: No maxillary sinus tenderness or frontal sinus tenderness.     Left Sinus: No maxillary sinus tenderness or frontal sinus tenderness.     Comments: No nasal polyps.    Mouth/Throat:     Mouth: Mucous membranes are not pale and not dry.     Pharynx: Uvula midline.  Eyes:     General:        Right eye: No discharge.        Left eye: No discharge.     Conjunctiva/sclera: Conjunctivae normal.     Right eye: Right conjunctiva is not injected. No chemosis.    Left eye: Left conjunctiva is not injected. No chemosis.    Pupils: Pupils are equal, round, and reactive to light.  Cardiovascular:     Rate and Rhythm: Normal rate and regular rhythm.     Heart sounds: Normal heart sounds.  Pulmonary:     Effort: Pulmonary effort is normal. No tachypnea, accessory  muscle usage or respiratory distress.     Breath sounds: Normal breath sounds. No wheezing, rhonchi or rales.  Chest:     Chest wall: No tenderness.  Abdominal:     Tenderness: There is no abdominal tenderness. There is no guarding or rebound.  Lymphadenopathy:     Head:     Right side of head: No submandibular, tonsillar or  occipital adenopathy.     Left side of head: No submandibular, tonsillar or occipital adenopathy.     Cervical: No cervical adenopathy.  Skin:    Coloration: Skin is not pale.     Findings: No abrasion, erythema, petechiae or rash. Rash is not papular, urticarial or vesicular.  Neurological:     Mental Status: He is alert.  Psychiatric:        Behavior: Behavior is cooperative.      Diagnostic studies: labs sent instead        Drexel Gentles, MD [facility] 

## 2023-09-23 NOTE — Patient Instructions (Addendum)
 1. Chronic diarrhea - We are going to get a repeat alpha gal to see where this level is.  - We are also going to get a tryptase to look for a mast cell disorder.  - We will get some testing to the most common foods as well.   2. Rash - Add on a topical steroid to help with the rash (triamcinolone twice daily).  3. Rhinorrhea  - Add on ipratropium one spray per nostril twice daily as needed (AIM FOR THE EARS). - This can be over drying, so beware of that.   4. Return in about 6 months (around 03/24/2024). You can have the follow up appointment with Dr. Idolina Maker or a Nurse Practicioner (our Nurse Practitioners are excellent and always have Physician oversight!). We may do it earlier if the labs warrant that.    Please inform us  of any Emergency Department visits, hospitalizations, or changes in symptoms. Call us  before going to the ED for breathing or allergy symptoms since we might be able to fit you in for a sick visit. Feel free to contact us  anytime with any questions, problems, or concerns.  It was a pleasure to meet you guys again today!  Websites that have reliable patient information: 1. American Academy of Asthma, Allergy, and Immunology: www.aaaai.org 2. Food Allergy Research and Education (FARE): foodallergy.org 3. Mothers of Asthmatics: http://www.asthmacommunitynetwork.org 4. American College of Allergy, Asthma, and Immunology: www.acaai.org      "Like" us  on Facebook and Instagram for our latest updates!      A healthy democracy works best when [employer] participate! Make sure you are registered to vote! If you have moved or changed any of your contact information, you will need to get this updated before voting! Scan the QR codes below to learn more!

## 2023-09-24 ENCOUNTER — Encounter: Payer: Self-pay | Admitting: Allergy & Immunology

## 2023-09-26 LAB — ALLERGY PANEL 18, NUT MIX GROUP

## 2023-09-26 LAB — ALLERGY PANEL 19, SEAFOOD GROUP

## 2023-09-26 LAB — TRYPTASE: Tryptase: 7.7 ug/L (ref 2.2–13.2)

## 2023-09-26 LAB — ALPHA-GAL PANEL
Allergen Lamb IgE: <0.1 kU/L
Beef IgE: <0.1 kU/L
IgE (Immunoglobulin E), Serum: 133 [IU]/mL (ref 6–495)
O215-IgE Alpha-Gal: 0.15 kU/L — AB
Pork IgE: <0.1 kU/L

## 2023-09-26 LAB — ALLERGEN MILK

## 2023-09-26 LAB — F245-IGE EGG, WHOLE

## 2023-09-26 LAB — ALLERGEN, WHEAT, F4

## 2023-09-26 LAB — ALLERGEN SOYBEAN

## 2023-10-30 ENCOUNTER — Other Ambulatory Visit: Payer: Self-pay | Admitting: Allergy & Immunology

## 2024-03-24 ENCOUNTER — Ambulatory Visit: Admitting: Allergy & Immunology
